# Patient Record
Sex: Female | Born: 1960 | Race: Black or African American | Hispanic: No | Marital: Single | Smoking: Former smoker
Health system: Southern US, Community
[De-identification: ages and names within clinical notes are randomized; demographics above are authoritative.]

## PROBLEM LIST (undated history)

## (undated) DIAGNOSIS — F250 Schizoaffective disorder, bipolar type: Secondary | ICD-10-CM

## (undated) DIAGNOSIS — J45909 Unspecified asthma, uncomplicated: Secondary | ICD-10-CM

## (undated) DIAGNOSIS — K219 Gastro-esophageal reflux disease without esophagitis: Secondary | ICD-10-CM

## (undated) DIAGNOSIS — F79 Unspecified intellectual disabilities: Secondary | ICD-10-CM

## (undated) DIAGNOSIS — G4730 Sleep apnea, unspecified: Secondary | ICD-10-CM

## (undated) HISTORY — PX: WRIST SURGERY: SHX841

---

## 2016-11-13 ENCOUNTER — Encounter (HOSPITAL_COMMUNITY): Payer: Self-pay | Admitting: *Deleted

## 2016-11-13 ENCOUNTER — Emergency Department (HOSPITAL_COMMUNITY)
Admission: EM | Admit: 2016-11-13 | Discharge: 2016-11-13 | Disposition: A | Discharge status: Skilled Nursing Facility | Payer: Medicaid Other | Attending: Emergency Medicine | Admitting: Emergency Medicine

## 2016-11-13 DIAGNOSIS — M25561 Pain in right knee: Secondary | ICD-10-CM | POA: Insufficient documentation

## 2016-11-13 DIAGNOSIS — J45909 Unspecified asthma, uncomplicated: Secondary | ICD-10-CM | POA: Insufficient documentation

## 2016-11-13 DIAGNOSIS — F489 Nonpsychotic mental disorder, unspecified: Secondary | ICD-10-CM | POA: Insufficient documentation

## 2016-11-13 DIAGNOSIS — G8929 Other chronic pain: Secondary | ICD-10-CM | POA: Insufficient documentation

## 2016-11-13 DIAGNOSIS — Z87891 Personal history of nicotine dependence: Secondary | ICD-10-CM | POA: Diagnosis not present

## 2016-11-13 DIAGNOSIS — M25562 Pain in left knee: Secondary | ICD-10-CM | POA: Diagnosis not present

## 2016-11-13 HISTORY — DX: Schizoaffective disorder, bipolar type: F25.0

## 2016-11-13 HISTORY — DX: Gastro-esophageal reflux disease without esophagitis: K21.9

## 2016-11-13 HISTORY — DX: Unspecified asthma, uncomplicated: J45.909

## 2016-11-13 HISTORY — DX: Unspecified intellectual disabilities: F79

## 2016-11-13 HISTORY — DX: Sleep apnea, unspecified: G47.30

## 2016-11-13 MED ORDER — NAPROXEN 500 MG PO TABS
500.0000 mg | ORAL_TABLET | Freq: Two times a day (BID) | ORAL | 0 refills | Status: DC
Start: 2016-11-13 — End: 2016-12-10

## 2016-11-13 MED ORDER — ACETAMINOPHEN 500 MG PO TABS
1000.0000 mg | ORAL_TABLET | Freq: Once | ORAL | Status: AC
  Administered 2016-11-13: 1000 mg via ORAL
  Filled 2016-11-13: qty 2

## 2016-11-13 NOTE — ED Triage Notes (Signed)
[facility]. Pt slipped on her comforter this morning. This was a witnessed fall, staff stated to ems she sat down in the floor. Denies hitting her head or any loc. EMS was called to help get the patient out of the floor. Upon arrival she told them she wanted to go to the hospital to have both of her knees checked. Pt isn't happy with facility she is staying at right now and would like more info about going somewhere else.

## 2016-11-13 NOTE — Discharge Instructions (Signed)
You have been prescribed naproxen for knee pain. Please take this as prescribed. Please read attached information about arthritis and knee pain.  Follow up with your primary care provider for worsening knee pain

## 2016-11-13 NOTE — ED Provider Notes (Signed)
[facility]-EMERGENCY DEPT Provider Note   CSN: [id number] Arrival date & time: 11/13/16  0841     History   Chief Complaint Chief Complaint  Patient presents with  . Knee Pain    HPI Sarah Hurst is a 56 y.o. female is brought to ED by EMS after witnessed fall PTA at assisted living facility.  Patient is alert and oriented to self, place and time. She was making her bed when she tripped over her comforter falling on her buttocks.  Fall was witnessed by facility staff.  Patient denies head trauma or LOC.  Denies back pain.  Patient is ambulatory with walker at baseline. After fall, she was able to get up with assist and ambulate to ambulance bed.  Today, patient is concerned about bilateral knee pain.  She has history of arthritis to bilateral knees. Pain in knees is aggravated by direct palpation, flexion and extension and walking. Pain is alleviated by prescribed medication for arthritis.  Patient did not fall on knees during fall today.  She has not taken her morning medications. Denies h/o gout or IVDU. No associated fevers, joint swelling, calf pain or asymmetric LE swelling. No h/o DVT.   HPI  Past Medical History:  Diagnosis Date  . Asthma   . GERD (gastroesophageal reflux disease)   . Mental disability   . Schizoaffective disorder, bipolar type (HCC)   . Sleep apnea     There are no active problems to display for this patient.   Past Surgical History:  Procedure Laterality Date  . WRIST SURGERY      OB History    No data available       Home Medications    Prior to Admission medications   Medication Sig Start Date End Date Taking? Authorizing Provider  naproxen (NAPROSYN) 500 MG tablet Take 1 tablet (500 mg total) by mouth 2 (two) times daily. 11/13/16   Liberty HandyGibbons, Ramin Zoll J, PA-C    Family History No family history on file.  Social History Social History  Substance Use Topics  . Smoking status: Former Games developermoker  . Smokeless tobacco: Never Used  . Alcohol use No      Allergies   Patient has no known allergies.   Review of Systems Review of Systems  Constitutional: Negative for fever.  Respiratory: Negative for shortness of breath.   Cardiovascular: Negative for chest pain, palpitations and leg swelling.  Gastrointestinal: Negative for nausea and vomiting.  Genitourinary: Negative for difficulty urinating.  Musculoskeletal: Positive for arthralgias. Negative for back pain and joint swelling.  Skin: Negative for wound.  Neurological: Negative for syncope, weakness and numbness.     Physical Exam Updated Vital Signs BP 134/81 (BP Location: Left Arm)   Pulse 81   Temp 97.5 F (36.4 C) (Oral)   Resp 16   Ht 5\' 7"  (1.702 m)   Wt (!) 181.4 kg (400 lb)   SpO2 100%   BMI 62.65 kg/m   Physical Exam  Constitutional: She is oriented to person, place, and time. She appears well-developed and well-nourished. No distress.  NAD.  HENT:  Head: Normocephalic and atraumatic.  Right Ear: External ear normal.  Left Ear: External ear normal.  Nose: Nose normal.  Mouth/Throat: Oropharynx is clear and moist.  Eyes: Conjunctivae and EOM are normal. Pupils are equal, round, and reactive to light. No scleral icterus.  Neck: Normal range of motion.  Cardiovascular: Normal rate, regular rhythm and normal heart sounds.   No murmur heard. DP, PT and popliteal  pulses 2+ bilaterally No LE edema No calf tenderness  Pulmonary/Chest: Effort normal and breath sounds normal. She has no wheezes.  Abdominal: Soft. Bowel sounds are normal. There is no tenderness.  Musculoskeletal: Normal range of motion. She exhibits tenderness. She exhibits no deformity.  +TTP to patellar tendon, bilaterally No obvious deformity of knees including edema, erythema or effusion.  Full passive ROM of knees bilaterally with normal patellar J tracking bilaterally.  No medial or lateral joint line tenderness.   No bony tenderness over patella, fibular head or tibial tuberosity.     No tenderness over MCL, LCL or quadriceps tendon.    Negative Lachman's. Negative posterior drawer test.   No varus or valgus laxity.  No crepitus with knee ROM.   Neurological: She is alert and oriented to person, place, and time.  5/5 strength with knee flexion and extension, bilaterally.  5/5 strength with ankle dorsiflexion and plantar flexion, bilaterally.  Feet: sensation to light touch intact in the distribution of the saphenous nerve, medial plantar nerve, lateral plantar nerve, bilaterally.   Skin: Skin is warm and dry. Capillary refill takes less than 2 seconds.  Psychiatric: She has a normal mood and affect. Her behavior is normal. Judgment and thought content normal.  Nursing note and vitals reviewed.    ED Treatments / Results  Labs (all labs ordered are listed, but only abnormal results are displayed) Labs Reviewed - No data to display  EKG  EKG Interpretation None       Radiology No results found.  Procedures Procedures (including critical care time)  Medications Ordered in ED Medications  acetaminophen (TYLENOL) tablet 1,000 mg (1,000 mg Oral Given 11/13/16 0929)     Initial Impression / Assessment and Plan / ED Course  I have reviewed the triage vital signs and the nursing notes.  Pertinent labs & imaging results that were available during my care of the patient were reviewed by me and considered in my medical decision making (see chart for details).    56 yo female brought to ED by EMS after witnessed mechanical fall PTA, patient landed on buttocks. Denies back pain. No head trauma or LOC. Patient was able to ambulate to ambulance bed after fall. I reviewed patient's medication list at bedside, she does not take any anticoagulants.  Patient's main concern today is her chronic bilateral knee pain. She notes its typical of her arthritis pain. On exam she has mild diffuse knee tenderness, bilaterally. No erythema, edema, fluctuance to suggest septic  arthritis or gout. No h/o IVDU or gout. No recent or preceding direct trauma to knees. No asymmetric LE edema, no calf or popliteal tenderness. Doubt DVT. Suspect MSK vs arthritis pain. No indication for imaging today. Will give tylenol.  Patient considered safe for discharge at this time with naproxen and f/u with PCP as needed for worsening or persistent symptoms. Patient verbalized understanding and is agreeable with plan.  Final Clinical Impressions(s) / ED Diagnoses   Final diagnoses:  Chronic pain of both knees    New Prescriptions Discharge Medication List as of 11/13/2016  9:51 AM    START taking these medications   Details  naproxen (NAPROSYN) 500 MG tablet Take 1 tablet (500 mg total) by mouth 2 (two) times daily., Starting Wed 11/13/2016, Print         Liberty Handy, PA-C 11/13/16 1122    Samuel Jester, DO 11/16/16 1919

## 2016-11-24 ENCOUNTER — Emergency Department (HOSPITAL_COMMUNITY)
Admission: EM | Admit: 2016-11-24 | Discharge: 2016-11-25 | Disposition: A | Discharge status: Home / Self Care | Payer: Medicaid Other | Attending: Emergency Medicine | Admitting: Emergency Medicine

## 2016-11-24 ENCOUNTER — Encounter (HOSPITAL_COMMUNITY): Payer: Self-pay | Admitting: Emergency Medicine

## 2016-11-24 DIAGNOSIS — R0602 Shortness of breath: Secondary | ICD-10-CM | POA: Diagnosis present

## 2016-11-24 DIAGNOSIS — R42 Dizziness and giddiness: Secondary | ICD-10-CM | POA: Diagnosis not present

## 2016-11-24 DIAGNOSIS — J45909 Unspecified asthma, uncomplicated: Secondary | ICD-10-CM | POA: Insufficient documentation

## 2016-11-24 DIAGNOSIS — Z87891 Personal history of nicotine dependence: Secondary | ICD-10-CM | POA: Diagnosis not present

## 2016-11-24 NOTE — ED Triage Notes (Signed)
Pt comes from [facility] by ems for not breathing right per pt and blood sugar of 67.

## 2016-11-25 ENCOUNTER — Emergency Department (HOSPITAL_COMMUNITY): Payer: Medicaid Other

## 2016-11-25 LAB — CBC WITH DIFFERENTIAL/PLATELET
BASOS ABS: 0 10*3/uL (ref 0.0–0.1)
Basophils Relative: 0 %
EOS PCT: 1 %
Eosinophils Absolute: 0.1 10*3/uL (ref 0.0–0.7)
HCT: 35.1 % — ABNORMAL LOW (ref 36.0–46.0)
Hemoglobin: 11.1 g/dL — ABNORMAL LOW (ref 12.0–15.0)
LYMPHS PCT: 34 %
Lymphs Abs: 2.9 10*3/uL (ref 0.7–4.0)
MCH: 28.6 pg (ref 26.0–34.0)
MCHC: 31.6 g/dL (ref 30.0–36.0)
MCV: 90.5 fL (ref 78.0–100.0)
Monocytes Absolute: 0.3 10*3/uL (ref 0.1–1.0)
Monocytes Relative: 4 %
NEUTROS ABS: 5.1 10*3/uL (ref 1.7–7.7)
Neutrophils Relative %: 61 %
PLATELETS: 146 10*3/uL — AB (ref 150–400)
RBC: 3.88 MIL/uL (ref 3.87–5.11)
RDW: 15 % (ref 11.5–15.5)
WBC: 8.5 10*3/uL (ref 4.0–10.5)

## 2016-11-25 LAB — URINALYSIS, ROUTINE W REFLEX MICROSCOPIC
BILIRUBIN URINE: NEGATIVE
GLUCOSE, UA: NEGATIVE mg/dL
Hgb urine dipstick: NEGATIVE
Ketones, ur: NEGATIVE mg/dL
Leukocytes, UA: NEGATIVE
Nitrite: NEGATIVE
PH: 5 (ref 5.0–8.0)
Protein, ur: NEGATIVE mg/dL
SPECIFIC GRAVITY, URINE: 1.026 (ref 1.005–1.030)

## 2016-11-25 LAB — COMPREHENSIVE METABOLIC PANEL
ALT: 10 U/L — AB (ref 14–54)
AST: 17 U/L (ref 15–41)
Albumin: 3.4 g/dL — ABNORMAL LOW (ref 3.5–5.0)
Alkaline Phosphatase: 48 U/L (ref 38–126)
Anion gap: 9 (ref 5–15)
BUN: 14 mg/dL (ref 6–20)
CHLORIDE: 110 mmol/L (ref 101–111)
CO2: 26 mmol/L (ref 22–32)
CREATININE: 0.66 mg/dL (ref 0.44–1.00)
Calcium: 9.2 mg/dL (ref 8.9–10.3)
GFR calc Af Amer: >60 mL/min (ref >60)
Glucose, Bld: 77 mg/dL (ref 65–99)
Potassium: 3.5 mmol/L (ref 3.5–5.1)
Sodium: 145 mmol/L (ref 135–145)
Total Bilirubin: 0.4 mg/dL (ref 0.3–1.2)
Total Protein: 6.6 g/dL (ref 6.5–8.1)

## 2016-11-25 LAB — TROPONIN I: Troponin I: <0.03 ng/mL (ref <0.03)

## 2016-11-25 LAB — BRAIN NATRIURETIC PEPTIDE: B Natriuretic Peptide: 91 pg/mL (ref 0.0–100.0)

## 2016-11-25 MED ORDER — MECLIZINE HCL 25 MG PO TABS: 25.0000 mg | ORAL_TABLET | Freq: Three times a day (TID) | ORAL | 0 refills | Status: DC | PRN

## 2016-11-25 NOTE — ED Notes (Signed)
[facility],

## 2016-11-25 NOTE — ED Notes (Signed)
Pt given crackers and diet ginger ale per request,

## 2016-11-25 NOTE — ED Notes (Signed)
[facility] here to transport to [facility],

## 2016-11-25 NOTE — ED Notes (Addendum)
Pt c/o sob and chest pain that started tonight, Dr Blinda LeatherwoodPollina in prior to RN, see edp assessment for further,

## 2016-11-25 NOTE — ED Provider Notes (Signed)
[facility]-EM[facility] DEPT Provider Note   CSN: [id number] Arrival date & time: 11/24/16  2352     History   Chief Complaint Chief Complaint  Patient presents with  . Hypoglycemia    HPI Sarah Hurst is a 56 y.o. female.  Patient presents to the emergency department for evaluation of shortness of breath. Patient is currently residing in a group home. She began to complain about having some difficulty breathing. She has not been recently ill, no cough, chest congestion. She has not experiencing any concomitant chest pain. She comes to the ER by ambulance. Vital signs have been normal. She did, however, have low blood sugar upon check. At arrival to the ER her main complaint is that she has been feeling like she is dizzy and spinning whenever she lies down. This began earlier today and has been present throughout the day. No associated mental status changes or headache. Has not had any numbness, tingling or weakness of the extremities, no unilateral symptoms. No speech difficulty.      Past Medical History:  Diagnosis Date  . Asthma   . GERD (gastroesophageal reflux disease)   . Mental disability   . Schizoaffective disorder, bipolar type (HCC)   . Sleep apnea     There are no active problems to display for this patient.   Past Surgical History:  Procedure Laterality Date  . WRIST SURGERY      OB History    No data available       Home Medications    Prior to Admission medications   Medication Sig Start Date End Date Taking? Authorizing Provider  meclizine (ANTIVERT) 25 MG tablet Take 1 tablet (25 mg total) by mouth 3 (three) times daily as needed for dizziness. 11/25/16   Gilda CreasePollina, Christopher J, MD  naproxen (NAPROSYN) 500 MG tablet Take 1 tablet (500 mg total) by mouth 2 (two) times daily. 11/13/16   Liberty HandyGibbons, Claudia J, PA-C    Family History No family history on file.  Social History Social History  Substance Use Topics  . Smoking status: Former Games developermoker  .  Smokeless tobacco: Never Used  . Alcohol use No     Allergies   Patient has no known allergies.   Review of Systems Review of Systems  Respiratory: Positive for shortness of breath.   Gastrointestinal: Negative for abdominal pain, nausea and vomiting.  Neurological: Positive for dizziness. Negative for headaches.  All other systems reviewed and are negative.    Physical Exam Updated Vital Signs BP (!) 121/104   Pulse 65   Temp 97.9 F (36.6 C)   Resp 16   Ht 5\' 7"  (1.702 m)   Wt (!) 181.4 kg (400 lb)   SpO2 100%   BMI 62.65 kg/m   Physical Exam  Constitutional: She is oriented to person, place, and time. She appears well-developed and well-nourished. No distress.  HENT:  Head: Normocephalic and atraumatic.  Right Ear: Hearing normal.  Left Ear: Hearing normal.  Nose: Nose normal.  Mouth/Throat: Oropharynx is clear and moist and mucous membranes are normal.  Eyes: Conjunctivae and EOM are normal. Pupils are equal, round, and reactive to light.  Neck: Normal range of motion. Neck supple.  Cardiovascular: Regular rhythm, S1 normal and S2 normal.  Exam reveals no gallop and no friction rub.   No murmur heard. Pulmonary/Chest: Effort normal and breath sounds normal. No respiratory distress. She exhibits no tenderness.  Abdominal: Soft. Normal appearance and bowel sounds are normal. There is no hepatosplenomegaly. There  is no tenderness. There is no rebound, no guarding, no tenderness at McBurney's point and negative Murphy's sign. No hernia.  Musculoskeletal: Normal range of motion.  Neurological: She is alert and oriented to person, place, and time. She has normal strength. No cranial nerve deficit or sensory deficit. Coordination normal. GCS eye subscore is 4. GCS verbal subscore is 5. GCS motor subscore is 6.  Extraocular muscle movement: normal No visual field cut Pupils: equal and reactive both direct and consensual response is normal No nystagmus present      Sensory function is intact to light touch, pinprick Proprioception intact  Grip strength 5/5 symmetric in upper extremities No pronator drift Normal finger to nose bilaterally  Lower extremity strength 5/5 against gravity Normal heel to shin bilaterally     Skin: Skin is warm, dry and intact. No rash noted. No cyanosis.  Psychiatric: She has a normal mood and affect. Her speech is normal and behavior is normal. Thought content normal.  Nursing note and vitals reviewed.    ED Treatments / Results  Labs (all labs ordered are listed, but only abnormal results are displayed) Labs Reviewed  CBC WITH DIFFERENTIAL/PLATELET - Abnormal; Notable for the following:       Result Value   Hemoglobin 11.1 (*)    HCT 35.1 (*)    Platelets 146 (*)    All other components within normal limits  COMPREHENSIVE METABOLIC PANEL - Abnormal; Notable for the following:    Albumin 3.4 (*)    ALT 10 (*)    All other components within normal limits  URINALYSIS, ROUTINE W REFLEX MICROSCOPIC - Abnormal; Notable for the following:    APPearance HAZY (*)    All other components within normal limits  TROPONIN I  BRAIN NATRIURETIC PEPTIDE    EKG  EKG Interpretation  Date/Time:  Monday November 25 2016 00:27:22 EDT Ventricular Rate:  54 PR Interval:    QRS Duration: 80 QT Interval:  400 QTC Calculation: 379 R Axis:   96 Text Interpretation:  Sinus rhythm Right ventricular hypertrophy Probable lateral infarct, age indeterminate No previous tracing Confirmed by Gilda Crease [phone]) on 11/25/2016 2:35:18 AM       Radiology Dg Chest 2 View  Result Date: 11/25/2016 CLINICAL DATA:  Dyspnea and chest pain, onset tonight. EXAM: CHEST  2 VIEW COMPARISON:  None. FINDINGS: The heart size and mediastinal contours are within normal limits. Both lungs are clear. The visualized skeletal structures are unremarkable. IMPRESSION: No active cardiopulmonary disease. Electronically Signed   By: Ellery Plunk M.D.   On: 11/25/2016 00:59   Ct Head Wo Contrast  Result Date: 11/25/2016 CLINICAL DATA:  Dyspnea and chest pain tonight.  Lightheadedness. EXAM: CT HEAD WITHOUT CONTRAST TECHNIQUE: Contiguous axial images were obtained from the base of the skull through the vertex without intravenous contrast. COMPARISON:  None. FINDINGS: Brain: There is no intracranial hemorrhage, mass or evidence of acute infarction. There is no extra-axial fluid collection. Gray matter and white matter appear normal. Cerebral volume is normal for age. Brainstem and posterior fossa are unremarkable. The CSF spaces appear normal. Vascular: No hyperdense vessel or unexpected calcification. Skull: Normal. Negative for fracture or focal lesion. Sinuses/Orbits: No acute finding. Other: None. IMPRESSION: Normal brain Electronically Signed   By: Ellery Plunk M.D.   On: 11/25/2016 00:51    Procedures Procedures (including critical care time)  Medications Ordered in ED Medications - No data to display   Initial Impression / Assessment and Plan /  ED Course  I have reviewed the triage vital signs and the nursing notes.  Pertinent labs & imaging results that were available during my care of the patient were reviewed by me and considered in my medical decision making (see chart for details).     Patient brought to the ER by EMS. She had originally complained of shortness of breath at the group home. At arrival to the ER her breathing has improved she has not had any chest pain. EKG does not show any obvious ischemia or infarct. Troponin is negative. No evidence of elevated BNP. Chest x-ray is clear, no pneumonia or congestive heart failure.  At arrival patient reports that she has been experiencing dizziness today. She reports a sensation of spinning when she lies down. She has no neurologic deficits noticed on examination. Her head CT was normal. Symptoms most likely peripheral vertigo. Will treat with  meclizine.  Final Clinical Impressions(s) / ED Diagnoses   Final diagnoses:  Vertigo    New Prescriptions New Prescriptions   MECLIZINE (ANTIVERT) 25 MG TABLET    Take 1 tablet (25 mg total) by mouth 3 (three) times daily as needed for dizziness.     Gilda Crease, MD 11/25/16 [phone]

## 2016-11-25 NOTE — ED Notes (Signed)
Lab at bedside for blood work.

## 2016-11-25 NOTE — ED Notes (Signed)
Patient transported to X-ray 

## 2016-11-25 NOTE — ED Notes (Signed)
RN [facility] with [facility] who advised that they would not have a ride for pt until am,

## 2016-12-10 ENCOUNTER — Encounter (HOSPITAL_COMMUNITY): Payer: Self-pay | Admitting: *Deleted

## 2016-12-10 ENCOUNTER — Emergency Department (HOSPITAL_COMMUNITY)
Admission: EM | Admit: 2016-12-10 | Discharge: 2016-12-10 | Disposition: A | Discharge status: Home / Self Care | Payer: Medicaid Other | Attending: Emergency Medicine | Admitting: Emergency Medicine

## 2016-12-10 DIAGNOSIS — R45851 Suicidal ideations: Secondary | ICD-10-CM | POA: Diagnosis present

## 2016-12-10 DIAGNOSIS — Z7984 Long term (current) use of oral hypoglycemic drugs: Secondary | ICD-10-CM | POA: Insufficient documentation

## 2016-12-10 DIAGNOSIS — F32A Depression, unspecified: Secondary | ICD-10-CM

## 2016-12-10 DIAGNOSIS — J45909 Unspecified asthma, uncomplicated: Secondary | ICD-10-CM | POA: Diagnosis not present

## 2016-12-10 DIAGNOSIS — Z79899 Other long term (current) drug therapy: Secondary | ICD-10-CM | POA: Insufficient documentation

## 2016-12-10 DIAGNOSIS — Z87891 Personal history of nicotine dependence: Secondary | ICD-10-CM | POA: Diagnosis not present

## 2016-12-10 DIAGNOSIS — F329 Major depressive disorder, single episode, unspecified: Secondary | ICD-10-CM | POA: Insufficient documentation

## 2016-12-10 NOTE — ED Notes (Signed)
Pt placed on to EMS stretcher. Pt verbalized understanding of discharge instructions.

## 2016-12-10 NOTE — Discharge Instructions (Signed)
Patient does not meet criteria for involuntary commitment. Follow-up with community mental health resources.

## 2016-12-10 NOTE — ED Triage Notes (Signed)
[facility], [phone]) under IVC paperwork. Per paperwork pt has hx of schizophrenia & prescribed medication. Pt told her health provider she wanted to leave group home & that she would kill herself.

## 2016-12-11 NOTE — ED Provider Notes (Signed)
[facility]-EMERG[facility]Y DEPT Provider Note   CSN: [id number] Arrival date & time: 12/10/16  2134     History   Chief Complaint Chief Complaint  Patient presents with  . V70.1    HPI Letha CapeDeborah Manolis is a 56 y.o. female.  Level V caveat for mental disability and schizoaffective disorder Patient presents under involuntary commitment from a group home after stating suicidal ideation. She no longer has suicidal ideation. No homicidal ideation or psychosis. She states she is unhappy at her group home.      Past Medical History:  Diagnosis Date  . Asthma   . GERD (gastroesophageal reflux disease)   . Mental disability   . Schizoaffective disorder, bipolar type (HCC)   . Sleep apnea     There are no active problems to display for this patient.   Past Surgical History:  Procedure Laterality Date  . WRIST SURGERY      OB History    No data available       Home Medications    Prior to Admission medications   Medication Sig Start Date End Date Taking? Authorizing Provider  calcium carbonate (CALCIUM 600) 1500 (600 Ca) MG TABS tablet Take 600 mg of elemental calcium by mouth daily with breakfast.   Yes [provider]  Cholecalciferol (VITAMIN D3) 1000 units CAPS Take 1,000 Units by mouth daily.   Yes [provider]  famotidine (PEPCID) 20 MG tablet Take 20 mg by mouth daily.   Yes [provider]  FLUoxetine (PROZAC) 20 MG capsule Take 20 mg by mouth daily.   Yes [provider]  metFORMIN (GLUCOPHAGE) 500 MG tablet Take 500 mg by mouth 2 (two) times daily with a meal.   Yes [provider]  Multiple Vitamins-Minerals (MULTIVITAMIN WITH MINERALS) tablet Take 1 tablet by mouth daily.   Yes [provider]  paliperidone (INVEGA SUSTENNA) 234 MG/1.5ML SUSP injection Inject 234 mg into the muscle every 30 (thirty) days.   Yes [provider]  risperiDONE (RISPERDAL) 1 MG tablet Take 1 mg by mouth 2 (two) times daily.    Yes [provider]    Family History No family history on file.  Social History Social History  Substance Use Topics  . Smoking status: Former Games developermoker  . Smokeless tobacco: Never Used  . Alcohol use No     Allergies   Patient has no known allergies.   Review of Systems Review of Systems  Reason unable to perform ROS: Mental disability.     Physical Exam Updated Vital Signs BP 130/80 (BP Location: Right Wrist)   Pulse 88   Temp 97.9 F (36.6 C) (Oral)   Resp 18   SpO2 97%   Physical Exam  Constitutional: She is oriented to person, place, and time.  Obese, no acute distress  HENT:  Head: Normocephalic and atraumatic.  Eyes: Conjunctivae are normal.  Neck: Neck supple.  Cardiovascular: Normal rate and regular rhythm.   Pulmonary/Chest: Effort normal and breath sounds normal.  Abdominal: Soft. Bowel sounds are normal.  Musculoskeletal: Normal range of motion.  Neurological: She is alert and oriented to person, place, and time.  Skin: Skin is warm and dry.  Psychiatric:  Flat affect  Nursing note and vitals reviewed.    ED Treatments / Results  Labs (all labs ordered are listed, but only abnormal results are displayed) Labs Reviewed - No data to display  EKG  EKG Interpretation None       Radiology No results found.  Procedures Procedures (including critical care time)  Medications Ordered in ED Medications - No data to display   Initial Impression / Assessment and Plan / ED Course  I have reviewed the triage vital signs and the nursing notes.  Pertinent labs & imaging results that were available during my care of the patient were reviewed by me and considered in my medical decision making (see chart for details).     Patient is not suicidal, homicidal, psychotic. Will resend IVC and return to group home  Final Clinical Impressions(s) / ED Diagnoses   Final diagnoses:  Depression, unspecified depression type    New  Prescriptions Discharge Medication List as of 12/10/2016 10:23 PM       Donnetta Hutching, MD 12/11/16 1747

## 2016-12-13 ENCOUNTER — Emergency Department (HOSPITAL_COMMUNITY)
Admission: EM | Admit: 2016-12-13 | Discharge: 2016-12-14 | Disposition: A | Discharge status: Other Acute Inpatient Hospital | Payer: Medicaid Other | Attending: Emergency Medicine | Admitting: Emergency Medicine

## 2016-12-13 ENCOUNTER — Encounter (HOSPITAL_COMMUNITY): Payer: Self-pay | Admitting: *Deleted

## 2016-12-13 DIAGNOSIS — Z87891 Personal history of nicotine dependence: Secondary | ICD-10-CM | POA: Insufficient documentation

## 2016-12-13 DIAGNOSIS — F250 Schizoaffective disorder, bipolar type: Secondary | ICD-10-CM | POA: Diagnosis not present

## 2016-12-13 DIAGNOSIS — R443 Hallucinations, unspecified: Secondary | ICD-10-CM | POA: Diagnosis present

## 2016-12-13 DIAGNOSIS — Z79899 Other long term (current) drug therapy: Secondary | ICD-10-CM | POA: Insufficient documentation

## 2016-12-13 DIAGNOSIS — J45909 Unspecified asthma, uncomplicated: Secondary | ICD-10-CM | POA: Diagnosis not present

## 2016-12-13 DIAGNOSIS — R45851 Suicidal ideations: Secondary | ICD-10-CM

## 2016-12-13 DIAGNOSIS — Z7984 Long term (current) use of oral hypoglycemic drugs: Secondary | ICD-10-CM | POA: Insufficient documentation

## 2016-12-13 LAB — CBC WITH DIFFERENTIAL/PLATELET
Basophils Absolute: 0 10*3/uL (ref 0.0–0.1)
Basophils Relative: 0 %
EOS PCT: 1 %
Eosinophils Absolute: 0.1 10*3/uL (ref 0.0–0.7)
HCT: 36.9 % (ref 36.0–46.0)
Hemoglobin: 11.9 g/dL — ABNORMAL LOW (ref 12.0–15.0)
LYMPHS ABS: 2.3 10*3/uL (ref 0.7–4.0)
LYMPHS PCT: 26 %
MCH: 29.1 pg (ref 26.0–34.0)
MCHC: 32.2 g/dL (ref 30.0–36.0)
MCV: 90.2 fL (ref 78.0–100.0)
MONO ABS: 0.3 10*3/uL (ref 0.1–1.0)
MONOS PCT: 3 %
Neutro Abs: 6.4 10*3/uL (ref 1.7–7.7)
Neutrophils Relative %: 70 %
PLATELETS: 200 10*3/uL (ref 150–400)
RBC: 4.09 MIL/uL (ref 3.87–5.11)
RDW: 14.9 % (ref 11.5–15.5)
WBC: 9.1 10*3/uL (ref 4.0–10.5)

## 2016-12-13 LAB — BASIC METABOLIC PANEL
Anion gap: 10 (ref 5–15)
BUN: 11 mg/dL (ref 6–20)
CALCIUM: 9.4 mg/dL (ref 8.9–10.3)
CO2: 27 mmol/L (ref 22–32)
Chloride: 105 mmol/L (ref 101–111)
Creatinine, Ser: 0.84 mg/dL (ref 0.44–1.00)
GFR calc Af Amer: >60 mL/min (ref >60)
GLUCOSE: 106 mg/dL — AB (ref 65–99)
POTASSIUM: 3.3 mmol/L — AB (ref 3.5–5.1)
Sodium: 142 mmol/L (ref 135–145)

## 2016-12-13 LAB — URINALYSIS, ROUTINE W REFLEX MICROSCOPIC
Glucose, UA: NEGATIVE mg/dL
Hgb urine dipstick: NEGATIVE
KETONES UR: NEGATIVE mg/dL
Nitrite: NEGATIVE
PH: 5 (ref 5.0–8.0)
Protein, ur: 30 mg/dL — AB
Specific Gravity, Urine: 1.034 — ABNORMAL HIGH (ref 1.005–1.030)

## 2016-12-13 LAB — RAPID URINE DRUG SCREEN, HOSP PERFORMED
Amphetamines: NOT DETECTED
BARBITURATES: NOT DETECTED
BENZODIAZEPINES: POSITIVE — AB
Cocaine: NOT DETECTED
Opiates: NOT DETECTED
Tetrahydrocannabinol: NOT DETECTED

## 2016-12-13 MED ORDER — FAMOTIDINE 20 MG PO TABS
20.0000 mg | ORAL_TABLET | Freq: Every day | ORAL | Status: DC
  Administered 2016-12-13 – 2016-12-14 (×2): 20 mg via ORAL
  Filled 2016-12-13 (×2): qty 1

## 2016-12-13 MED ORDER — PALIPERIDONE PALMITATE 234 MG/1.5ML IM SUSP: 234.0000 mg | INTRAMUSCULAR | Status: DC

## 2016-12-13 MED ORDER — ADULT MULTIVITAMIN W/MINERALS CH
ORAL_TABLET | ORAL | Status: AC
  Administered 2016-12-13: 1 via ORAL
  Filled 2016-12-13: qty 1

## 2016-12-13 MED ORDER — FLUOXETINE HCL 20 MG PO CAPS
20.0000 mg | ORAL_CAPSULE | Freq: Every day | ORAL | Status: DC
  Administered 2016-12-13 – 2016-12-14 (×2): 20 mg via ORAL
  Filled 2016-12-13 (×2): qty 1

## 2016-12-13 MED ORDER — RISPERIDONE 1 MG PO TABS
1.0000 mg | ORAL_TABLET | Freq: Two times a day (BID) | ORAL | Status: DC
Start: 2016-12-13 — End: 2016-12-14
  Administered 2016-12-13 – 2016-12-14 (×3): 1 mg via ORAL
  Filled 2016-12-13 (×7): qty 1

## 2016-12-13 MED ORDER — HALOPERIDOL DECANOATE 100 MG/ML IM SOLN
100.0000 mg | Freq: Once | INTRAMUSCULAR | Status: AC
  Administered 2016-12-13: 100 mg via INTRAMUSCULAR
  Filled 2016-12-13: qty 1

## 2016-12-13 MED ORDER — METFORMIN HCL 500 MG PO TABS
500.0000 mg | ORAL_TABLET | Freq: Two times a day (BID) | ORAL | Status: DC
  Administered 2016-12-13 – 2016-12-14 (×3): 500 mg via ORAL
  Filled 2016-12-13 (×3): qty 1

## 2016-12-13 MED ORDER — ADULT MULTIVITAMIN W/MINERALS CH
1.0000 | ORAL_TABLET | Freq: Every day | ORAL | Status: DC
  Administered 2016-12-13 – 2016-12-14 (×2): 1 via ORAL
  Filled 2016-12-13: qty 1

## 2016-12-13 MED ORDER — ACETAMINOPHEN 325 MG PO TABS
650.0000 mg | ORAL_TABLET | ORAL | Status: DC | PRN
  Administered 2016-12-14: 650 mg via ORAL
  Filled 2016-12-13: qty 2

## 2016-12-13 MED ORDER — ALUM & MAG HYDROXIDE-SIMETH 200-200-20 MG/5ML PO SUSP: 30.0000 mL | Freq: Four times a day (QID) | ORAL | Status: DC | PRN

## 2016-12-13 MED ORDER — ZOLPIDEM TARTRATE 5 MG PO TABS
5.0000 mg | ORAL_TABLET | Freq: Every evening | ORAL | Status: DC | PRN
  Administered 2016-12-13: 5 mg via ORAL
  Filled 2016-12-13: qty 1

## 2016-12-13 MED ORDER — ONDANSETRON HCL 4 MG PO TABS: 4.0000 mg | ORAL_TABLET | Freq: Three times a day (TID) | ORAL | Status: DC | PRN

## 2016-12-13 NOTE — ED Notes (Signed)
Pt has been wanded by security and placed in a gown. Pt unable to fit in purple scrubs.   Belongings have been placed in locker room.
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
## 2016-12-13 NOTE — ED Provider Notes (Signed)
[facility]-EMERGENCY DEPT Provider Note   CSN: [id number] Arrival date & time: 12/13/16  1231     History   Chief Complaint Chief Complaint  Patient presents with  . Suicidal    HPI Sarah Hurst is a 56 y.o. female.  HPI  The patient is a 57 year old female, she has a known history of schizophrenia, she was in the hospital 3 days ago when she was here under involuntary commitment after voicing suicidal ideation. By the time that she arrived she stated that she was no longer suicidal and stated that she was just unhappy at her group home. Today she states that she has been having voices telling her to kill her self, she states that the staff at her facility have told her that if she is not going to eat or take her medications than she should just kill her self. In response to this the patient got her walker and started walking down the road in the middle of the road. She is brought in for evaluation in the situation. The patient states that she is actively suicidal. She states that the majority of her unhappiness stems from her living situation and the fact that her son is not visited her in 2 years as well as the auditory hallucinations which are command hallucinations telling her to kill her self. She has not taken her medication in several days, she is not more specific than this. She did take her medications this morning. She has not been eating the food stating that "it's nasty"  Past Medical History:  Diagnosis Date  . Asthma   . GERD (gastroesophageal reflux disease)   . Mental disability   . Schizoaffective disorder, bipolar type (HCC)   . Sleep apnea     There are no active problems to display for this patient.   Past Surgical History:  Procedure Laterality Date  . WRIST SURGERY      OB History    No data available       Home Medications    Prior to Admission medications   Medication Sig Start Date End Date Taking? Authorizing Provider  calcium carbonate (CALCIUM  600) 1500 (600 Ca) MG TABS tablet Take 600 mg of elemental calcium by mouth daily with breakfast.    [provider]  Cholecalciferol (VITAMIN D3) 1000 units CAPS Take 1,000 Units by mouth daily.    [provider]  famotidine (PEPCID) 20 MG tablet Take 20 mg by mouth daily.    [provider]  FLUoxetine (PROZAC) 20 MG capsule Take 20 mg by mouth daily.    [provider]  metFORMIN (GLUCOPHAGE) 500 MG tablet Take 500 mg by mouth 2 (two) times daily with a meal.    [provider]  Multiple Vitamins-Minerals (MULTIVITAMIN WITH MINERALS) tablet Take 1 tablet by mouth daily.    [provider]  paliperidone (INVEGA SUSTENNA) 234 MG/1.5ML SUSP injection Inject 234 mg into the muscle every 30 (thirty) days.    [provider]  risperiDONE (RISPERDAL) 1 MG tablet Take 1 mg by mouth 2 (two) times daily.    [provider]    Family History No family history on file.  Social History Social History  Substance Use Topics  . Smoking status: Former Games developer  . Smokeless tobacco: Never Used  . Alcohol use No     Allergies   Patient has no known allergies.   Review of Systems Review of Systems  All other systems reviewed and are negative.  Physical Exam Updated Vital Signs Ht 5\' 7"  (1.702 m)   Wt (!) 181.4 kg (400 lb)   BMI 62.65 kg/m   Physical Exam  Constitutional: She appears well-developed and well-nourished. No distress.  HENT:  Head: Normocephalic and atraumatic.  Mouth/Throat: Oropharynx is clear and moist. No oropharyngeal exudate.  Eyes: Pupils are equal, round, and reactive to light. Conjunctivae and EOM are normal. Right eye exhibits no discharge. Left eye exhibits no discharge. No scleral icterus.  Neck: Normal range of motion. Neck supple. No JVD present. No thyromegaly present.  Cardiovascular: Normal rate, regular rhythm, normal heart sounds and intact distal pulses.  Exam reveals no gallop and no  friction rub.   No murmur heard. Pulmonary/Chest: Effort normal and breath sounds normal. No respiratory distress. She has no wheezes. She has no rales.  Abdominal: Soft. Bowel sounds are normal. She exhibits no distension and no mass. There is no tenderness.  Musculoskeletal: Normal range of motion. She exhibits no edema or tenderness.  Lymphadenopathy:    She has no cervical adenopathy.  Neurological: She is alert. Coordination normal.  Skin: Skin is warm and dry. No rash noted. No erythema.  Psychiatric: She has a normal mood and affect. Her behavior is normal.  Flat affect Voices hallucinations Actively suicidal  Nursing note and vitals reviewed.    ED Treatments / Results  Labs (all labs ordered are listed, but only abnormal results are displayed) Labs Reviewed - No data to display   Radiology No results found.  Procedures Procedures (including critical care time)  Medications Ordered in ED Medications - No data to display   Initial Impression / Assessment and Plan / ED Course  I have reviewed the triage vital signs and the nursing notes.  Pertinent labs & imaging results that were available during my care of the patient were reviewed by me and considered in my medical decision making (see chart for details).     We'll pursue workup for her progressive suicidal thoughts and ideations, the patient does not appear to be in medical distress  SW states needs TTS consult Consult ordered Home meds reconciled Stable appearing  Change of shift - care signed out to oncoming EDP to f/u results   Final Clinical Impressions(s) / ED Diagnoses   Final diagnoses:  None    New Prescriptions New Prescriptions   No medications on file     Eber HongMiller, Andren Bethea, MD 12/13/16 1519

## 2016-12-13 NOTE — ED Notes (Signed)
Pts walker placed in locker room.

## 2016-12-13 NOTE — Clinical Social Work Note (Signed)
LCSW spoke with Waldron SessionVonda  [phone] with [employer] who advised that patient has been threatening to kills herself all week and that patient has been refusing her medication all week.  She has also been refusing to eat. Patient took off her adult brief at the facility and urinated and defecated in the facility floor multiple times per day this week. She refused ACTT services (her IM) and left the facility with an adult brief walking down [address] with her walker [facility]  Patient also has a history of threatening to cut herself. [facility] for [facility].  [facility]   She can return to the facility. Needs TTS consult.   ACTT services with [employer].    Anaiyah Anglemyer, Juleen ChinaHeather D, LCSW

## 2016-12-13 NOTE — ED Notes (Signed)
Pharmacy states no invega. edp aware.

## 2016-12-13 NOTE — BHH Counselor (Signed)
TTS assessment complete- pt is recommended for inpatient treatment per Lamona Curlina O.   Zahir Eisenhour LPC, LCAS

## 2016-12-13 NOTE — ED Triage Notes (Signed)
Pt comes in by EMS for suicidal thoughts. She lives at a facility and expressed thoughts of wanting to harm herself and was told by staff to walk into the road with her walker if she wanted to kill herself. So, pt did this, EMS was called by police because reports were coming in of a woman standing in the road. Pt is voluntary. She told EMS to give her a razor blade so that she could harm herself in route.

## 2016-12-14 LAB — CBG MONITORING, ED
GLUCOSE-CAPILLARY: 75 mg/dL (ref 65–99)
Glucose-Capillary: 76 mg/dL (ref 65–99)
Glucose-Capillary: 84 mg/dL (ref 65–99)

## 2016-12-14 NOTE — Progress Notes (Signed)
Tinnie GensJeffrey with [facility] requested 1st page of IVC papers to be refaxed. [facility]-ED RN Zella Ballobin to be informed.  Melbourne Abtsatia Rakwon Letourneau, LCSWA Disposition staff 12/14/2016 11:02 AM

## 2016-12-14 NOTE — ED Notes (Signed)
TTS in progress 

## 2016-12-14 NOTE — ED Notes (Signed)
Pt notified of pending transfer

## 2016-12-14 NOTE — ED Notes (Signed)
Sitter assisted pt with bed bath. Magenta paper scrubs too small. Pt allowed to put on house dress.

## 2016-12-14 NOTE — Progress Notes (Addendum)
[facility] was [facility]. To Dr. Guss Bundehalla, to [facility]. Please call report at [phone]. Bed is ready but not enough staffing, per Tinnie GensJeffrey in admissions, ETA after 19:00.  [facility]-ED RN Zella BallRobin was notified.  Mel[place] Abtsatia Lisbet Busker, LCSWA Disposition staff 12/14/2016 1:21 PM

## 2016-12-14 NOTE — Progress Notes (Addendum)
[facility] requested patient's guardianship papers. This Education officer, environmentalwriter left voicemail for Waldron SessionVonda  [phone] with [employer] and requested call back. [facility]-ED RN Zella Ballobin notified.  Mel[place] Abtsatia Cherika Jessie, LCSWA Disposition staff 12/14/2016 11:12 AM

## 2016-12-14 NOTE — ED Notes (Signed)
[facility]

## 2016-12-14 NOTE — ED Notes (Signed)
Pt has been accepted to [facility] in the transition unit . Accepting is Dr Retia Passeharlla

## 2016-12-14 NOTE — Progress Notes (Signed)
Tinnie GensJeffrey with [facility] inquired if patient had any medical problems, if pt's IVC can be faxed, and if pt can return to her group home.  Per [facility]-ED RN Zella Ballobin, patient is medically cleared, [facility] papers will be [facility] at [phone], and pt is using walker to ambulate doing her ADLs. Tinnie GensJeffrey has been notified.  [facility], [facility] [phone], advised that patient can come back if patient went to treatment.   Writer awaiting call back from [facility] regarding [facility] efforts.  Mel[place] Abtsatia Saragrace Selke, LCSWA Disposition staff 12/14/2016 11:01 AM
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
## 2016-12-26 ENCOUNTER — Encounter (HOSPITAL_COMMUNITY): Payer: Self-pay | Admitting: *Deleted

## 2016-12-26 ENCOUNTER — Emergency Department (HOSPITAL_COMMUNITY)
Admission: EM | Admit: 2016-12-26 | Discharge: 2016-12-26 | Disposition: A | Discharge status: Home / Self Care | Payer: Medicaid Other | Attending: Emergency Medicine | Admitting: Emergency Medicine

## 2016-12-26 DIAGNOSIS — F250 Schizoaffective disorder, bipolar type: Secondary | ICD-10-CM | POA: Diagnosis not present

## 2016-12-26 DIAGNOSIS — F329 Major depressive disorder, single episode, unspecified: Secondary | ICD-10-CM | POA: Diagnosis present

## 2016-12-26 DIAGNOSIS — R45851 Suicidal ideations: Secondary | ICD-10-CM | POA: Diagnosis not present

## 2016-12-26 DIAGNOSIS — J45909 Unspecified asthma, uncomplicated: Secondary | ICD-10-CM | POA: Diagnosis not present

## 2016-12-26 DIAGNOSIS — Z87891 Personal history of nicotine dependence: Secondary | ICD-10-CM | POA: Insufficient documentation

## 2016-12-26 LAB — CBC WITH DIFFERENTIAL/PLATELET
BASOS PCT: 0 %
Basophils Absolute: 0 10*3/uL (ref 0.0–0.1)
EOS ABS: 0 10*3/uL (ref 0.0–0.7)
Eosinophils Relative: 0 %
HCT: 34.2 % — ABNORMAL LOW (ref 36.0–46.0)
Hemoglobin: 10.9 g/dL — ABNORMAL LOW (ref 12.0–15.0)
Lymphocytes Relative: 29 %
Lymphs Abs: 2.2 10*3/uL (ref 0.7–4.0)
MCH: 28.8 pg (ref 26.0–34.0)
MCHC: 31.9 g/dL (ref 30.0–36.0)
MCV: 90.5 fL (ref 78.0–100.0)
MONO ABS: 0.3 10*3/uL (ref 0.1–1.0)
Monocytes Relative: 4 %
NEUTROS ABS: 5.1 10*3/uL (ref 1.7–7.7)
NEUTROS PCT: 67 %
PLATELETS: 152 10*3/uL (ref 150–400)
RBC: 3.78 MIL/uL — ABNORMAL LOW (ref 3.87–5.11)
RDW: 15.3 % (ref 11.5–15.5)
WBC: 7.6 10*3/uL (ref 4.0–10.5)

## 2016-12-26 LAB — URINALYSIS, ROUTINE W REFLEX MICROSCOPIC
Glucose, UA: NEGATIVE mg/dL
Hgb urine dipstick: NEGATIVE
KETONES UR: NEGATIVE mg/dL
Nitrite: NEGATIVE
PH: 5 (ref 5.0–8.0)
PROTEIN: 30 mg/dL — AB
Specific Gravity, Urine: 1.029 (ref 1.005–1.030)

## 2016-12-26 LAB — COMPREHENSIVE METABOLIC PANEL
ALT: 11 U/L — ABNORMAL LOW (ref 14–54)
ANION GAP: 7 (ref 5–15)
AST: 24 U/L (ref 15–41)
Albumin: 3.3 g/dL — ABNORMAL LOW (ref 3.5–5.0)
Alkaline Phosphatase: 42 U/L (ref 38–126)
BUN: 13 mg/dL (ref 6–20)
CALCIUM: 9.2 mg/dL (ref 8.9–10.3)
CHLORIDE: 111 mmol/L (ref 101–111)
CO2: 27 mmol/L (ref 22–32)
Creatinine, Ser: 0.76 mg/dL (ref 0.44–1.00)
GFR calc non Af Amer: >60 mL/min (ref >60)
Glucose, Bld: 95 mg/dL (ref 65–99)
Potassium: 4.2 mmol/L (ref 3.5–5.1)
SODIUM: 145 mmol/L (ref 135–145)
Total Bilirubin: 0.4 mg/dL (ref 0.3–1.2)
Total Protein: 6.7 g/dL (ref 6.5–8.1)

## 2016-12-26 LAB — RAPID URINE DRUG SCREEN, HOSP PERFORMED
AMPHETAMINES: NOT DETECTED
BARBITURATES: NOT DETECTED
BARBITURATES: NOT DETECTED
BENZODIAZEPINES: NOT DETECTED
Cocaine: NOT DETECTED
Opiates: NOT DETECTED
TETRAHYDROCANNABINOL: NOT DETECTED

## 2016-12-26 LAB — ETHANOL: Alcohol, Ethyl (B): <5 mg/dL (ref <5)

## 2016-12-26 MED ORDER — INSULIN ASPART 100 UNIT/ML ~~LOC~~ SOLN: 1.0000 [IU] | Freq: Three times a day (TID) | SUBCUTANEOUS | Status: DC

## 2016-12-26 MED ORDER — METRONIDAZOLE 500 MG PO TABS: 500.0000 mg | ORAL_TABLET | Freq: Three times a day (TID) | ORAL | Status: DC

## 2016-12-26 MED ORDER — ZOLPIDEM TARTRATE 5 MG PO TABS: 5.0000 mg | ORAL_TABLET | Freq: Every evening | ORAL | Status: DC | PRN

## 2016-12-26 MED ORDER — RISPERIDONE 1 MG PO TABS
1.0000 mg | ORAL_TABLET | Freq: Two times a day (BID) | ORAL | Status: DC
  Filled 2016-12-26 (×2): qty 1

## 2016-12-26 MED ORDER — RISAQUAD PO CAPS
1.0000 | ORAL_CAPSULE | Freq: Three times a day (TID) | ORAL | Status: DC
  Filled 2016-12-26 (×2): qty 1

## 2016-12-26 MED ORDER — HALOPERIDOL 2 MG PO TABS
2.0000 mg | ORAL_TABLET | Freq: Two times a day (BID) | ORAL | Status: DC
  Filled 2016-12-26 (×2): qty 1

## 2016-12-26 MED ORDER — FAMOTIDINE 20 MG PO TABS: 20.0000 mg | ORAL_TABLET | Freq: Every day | ORAL | Status: DC

## 2016-12-26 NOTE — Care Management (Signed)
Pt's gaurdian called CM dept to notify us that pt is returning to the hospital. She is aware and may be contacted if needed. She can bring documentation of guardianship again if necessary also. CM see's guardianship papers on the document list.

## 2016-12-26 NOTE — Consult Note (Signed)
Pt was seen over tele psych machine by this Clinical research associatewriter and Kateri PlummerKristin Cheshire, [facility] Counsel[facility]. Pt resides in a group home. Pt stated she is mad at the group home lady because she is "mean to me and makes me get up at 8 and I can't lay back down until 12." Pt is well known to this hospital and is at her baseline with chronic suicidal ideation. Pt is somewhat cognitively limited. Pt's guardian and group home were contacted in reference to Pt being at the emergency room and all parties are on board for Pt to return back to her group home. This writer attempted to reach the EDP for disposition but he was unavailable at this time. Pt's nurse and EDP were made aware of discharge plans by TTS.  Pt is psychiatrically cleared for discharge back to her group home.    Sarah AbbeLaurie Britton Parks,  NP-C 12/26/2016     1845

## 2016-12-26 NOTE — ED Notes (Signed)
Pt assisted to bedside toilet prior to leaving and taken to sink to wash hands via WC.

## 2016-12-26 NOTE — ED Triage Notes (Signed)
Pt comes in by EMS for suicidal actions. [facility] and states she isn't happy there. She wants to harm herself. She was found walking into traffic. Pt denies doing this to act out to go to another group home. Pt is alert and oriented.

## 2016-12-26 NOTE — Progress Notes (Signed)
CSW called [facility] ED Nurse, Sarah Hurst, to make her aware of existing discharge plan.  [facility] ED Nurse asked if anyone had spoken to EDP, Dr. Ranae Hurst, and stated "he was concerned about the pt going back to the group home.".   CSW attempted to contact the TTS Specialist that conducted the assessment to verify that the pt's status had been discussed with the EDP as is the typical protocol.  CSW offered to have the TTS NP contact Dr. Ranae Hurst and got number.  CSW was then able to talk to TTS Specialist, who confirmed that a discussion had already taken place with Dr. Ranae Hurst and he was in agreement with d/c.  CSW called APED Nurse back to relay this information.  [facility]ED Nurse, stated that [facility]  [facility] is to pick pt up at 7:30 PM.  Sarah Hurst, MSW, LCSWA Disposition Clinical Social Work [phone] (cell) [phone] (office)

## 2016-12-26 NOTE — Progress Notes (Addendum)
CSW called [facility]  [phone]) and spoke to house supervisor, [facility].  Ms. Bridgette HabermannHope advised that pt's guardian may not want her to [facility] the [facility] and asked that I call to confirm whether this was accurate.  CSW called [employer] [employer] [facility] [place] [phone] and spoke to Jaymes GraffAshley Brown, after hours staff on call, who agreed to contact guardian to verify decision about pt [facility].    Ms. Manson PasseyBrown called CSW back and indicated that [facility] per guardian's instructions. Per Ms. Manson PasseyBrown, Guardian had attempted to reach [facility] via text earlier today to let him know that pt could [facility] if [facility]/c'd.  CSW called [facility] [phone]) back and spoke to Ms. Hope who requested that the pt be returned by ambulance.  CSW explained that pt would need to be picked up.  Ms. Bridgette HabermannHope stated that she would "see if someone can come pick her up." and would call this writer back.  CSW will continue to follow to discharge.  Timmothy EulerJean T. Kaylyn LimSutter, MSW, LCSWA Disposition Clinical Social Work [phone] (cell) [phone] (office)

## 2016-12-26 NOTE — ED Notes (Signed)
Pt given her wallet with 4$ and some change, earphone-radio, and cell phone prior to leaving ED

## 2016-12-26 NOTE — ED Provider Notes (Signed)
[facility]-EM[facility]Y DEPT Provider Note   CSN: [id number] Arrival date & time: 12/26/16  1436     History   Chief Complaint Chief Complaint  Patient presents with  . Suicidal    HPI Sarah Hurst is a [age] y.o. female.  HPI The patient comes by EMS for suicidality. She was found walking into traffic. States she was trying to harm herself. Says she's been increasingly depressed because she misses her son that she is unhappy staying at the group home where she currently resides. States she has auditory hallucinations telling her to harm herself. Denies any visual hallucinations. No drugs or alcohol. States she's been compliant with her medication. Denies any other complaints other than bilateral knee arthralgias which she states is chronic. No new trauma. Past Medical History:  Diagnosis Date  . Asthma   . GERD (gastroesophageal reflux disease)   . Mental disability   . Schizoaffective disorder, bipolar type (HCC)   . Sleep apnea     There are no active problems to display for this patient.   Past Surgical History:  Procedure Laterality Date  . WRIST SURGERY      OB History    No data available       Home Medications    Prior to Admission medications   Medication Sig Start Date End Date Taking? Authorizing Provider  famotidine (PEPCID) 20 MG tablet Take 20 mg by mouth daily.   Yes [provider]  haloperidol (HALDOL) 1 MG tablet Take 2 mg by mouth 2 (two) times daily.   Yes [provider]  insulin lispro (HUMALOG) 100 UNIT/ML injection Inject 1-12 Units into the skin 4 (four) times daily -  before meals and at bedtime. Patient takes per sliding scale; bg 70-149: 0 units, bg 150-200: 2 units, bg 201-250: 4 units, bg 251-300: 6 units, bg 301-350: 8 units, bg 351-400: 10 units, bg 401+: 12 units and contact medical doctor   Yes [provider]  lactobacillus acidophilus (BACID) TABS tablet Take 1 tablet by mouth 3 (three) times daily.   Yes [provider]  metroNIDAZOLE (FLAGYL) 500 MG tablet Take 500 mg by mouth 3 (three) times daily.   Yes [provider]  Multiple Vitamins-Minerals (MULTIVITAMIN WITH MINERALS) tablet Take 1 tablet by mouth daily.   Yes [provider]  ondansetron (ZOFRAN) 4 MG tablet Take 4 mg by mouth every 6 (six) hours as needed for nausea or vomiting.   Yes [provider]  risperiDONE (RISPERDAL) 1 MG tablet Take 1 mg by mouth 2 (two) times daily.   Yes [provider]  zolpidem (AMBIEN) 5 MG tablet Take 5 mg by mouth at bedtime as needed for sleep.   Yes [provider]    Family History No family history on file.  Social History Social History  Substance Use Topics  . Smoking status: Former Games developermoker  . Smokeless tobacco: Never Used  . Alcohol use No     Allergies   Patient has no known allergies.   Review of Systems Review of Systems  Constitutional: Negative for chills and fever.  Respiratory: Negative for shortness of breath.   Cardiovascular: Negative for chest pain.  Gastrointestinal: Negative for abdominal pain, constipation, diarrhea, nausea and vomiting.  Musculoskeletal: Positive for arthralgias. Negative for back pain, joint swelling, myalgias, neck pain and neck stiffness.  Skin: Negative for rash.  Neurological: Negative for dizziness, weakness, light-headedness, numbness and headaches.  Psychiatric/Behavioral: Positive for dysphoric mood, hallucinations and suicidal ideas.  All other systems reviewed and are negative.    Physical Exam Updated Vital Signs BP (!) 111/57   Pulse 71   Temp 98.1 F (36.7 C) (Oral)   Resp 16   Ht 5\' 7"  (1.702 m)   Wt (!) 181.4 kg (400 lb)   SpO2 100%   BMI 62.65 kg/m   Physical Exam  Constitutional: She is oriented to person, place, and time. She appears well-developed and well-nourished. No distress.  HENT:  Head: Normocephalic and atraumatic.  Mouth/Throat: Oropharynx is clear and moist.  No oropharyngeal exudate.  Eyes: Pupils are equal, round, and reactive to light. EOM are normal.  Neck: Normal range of motion. Neck supple.  Cardiovascular: Normal rate and regular rhythm.  Exam reveals no gallop and no friction rub.   No murmur heard. Pulmonary/Chest: Effort normal and breath sounds normal. No respiratory distress. She has no wheezes. She has no rales. She exhibits no tenderness.  Abdominal: Soft. Bowel sounds are normal. There is no tenderness. There is no rebound and no guarding.  Musculoskeletal: Normal range of motion. She exhibits no edema or tenderness.  Full range of motion of bilateral knees. No deformity, erythema, warmth or effusion.  Neurological: She is alert and oriented to person, place, and time.  5/5 motor in all extremities. Sensation fully intact.  Skin: Skin is warm and dry. No rash noted. No erythema.  Psychiatric: Her behavior is normal.  Blunt affect. Admits to suicidal ideation. Admits to auditory hallucinations. Does not appear to be responding to internal stimuli currently.  Nursing note and vitals reviewed.    ED Treatments / Results  Labs (all labs ordered are listed, but only abnormal results are displayed) Labs Reviewed  CBC WITH DIFFERENTIAL/PLATELET - Abnormal; Notable for the following:       Result Value   RBC 3.78 (*)    Hemoglobin 10.9 (*)    HCT 34.2 (*)    All other components within normal limits  COMPREHENSIVE METABOLIC PANEL - Abnormal; Notable for the following:    Albumin 3.3 (*)    ALT 11 (*)    All other components within normal limits  RAPID URINE DRUG SCREEN, HOSP PERFORMED - Abnormal; Notable for the following:    Opiates RESULTS UNAVAILABLE DUE TO INTERFERING SUBSTANCE (*)    Cocaine RESULTS UNAVAILABLE DUE TO INTERFERING SUBSTANCE (*)    Benzodiazepines RESULTS UNAVAILABLE DUE TO INTERFERING SUBSTANCE (*)    Amphetamines RESULTS UNAVAILABLE DUE TO INTERFERING SUBSTANCE (*)    Tetrahydrocannabinol RESULTS  UNAVAILABLE DUE TO INTERFERING SUBSTANCE (*)    All other components within normal limits  URINALYSIS, ROUTINE W REFLEX MICROSCOPIC - Abnormal; Notable for the following:    Color, Urine AMBER (*)    APPearance CLOUDY (*)    Bilirubin Urine SMALL (*)    Protein, ur 30 (*)    Leukocytes, UA SMALL (*)    Bacteria, UA MANY (*)    Squamous Epithelial / LPF TOO NUMEROUS TO COUNT (*)    All other components within normal limits  URINE CULTURE  ETHANOL  RAPID URINE DRUG SCREEN, HOSP PERFORMED    EKG  EKG Interpretation None       Radiology No results found.  Procedures Procedures (including critical care time)  Medications Ordered in ED Medications  famotidine (PEPCID) tablet 20 mg (not administered)  haloperidol (HALDOL) tablet 2 mg (not administered)  insulin aspart (novoLOG) injection 1-12 Units (not administered)  acidophilus (RISAQUAD) capsule 1 capsule (not administered)  metroNIDAZOLE (FLAGYL) tablet  500 mg (not administered)  risperiDONE (RISPERDAL) tablet 1 mg (not administered)  zolpidem (AMBIEN) tablet 5 mg (not administered)     Initial Impression / Assessment and Plan / ED Course  I have reviewed the triage vital signs and the nursing notes.  Pertinent labs & imaging results that were available during my care of the patient were reviewed by me and considered in my medical decision making (see chart for details).     Patient is medically cleared for psychiatric evaluation.   Evaluated by TTS. They're familiar with the patient. She's presented with similar symptoms in the past. Staying in a group home and in the thinks that she is feigning suicidal ideation for secondary gain. Recommending discharge back to group home and outpatient follow-up. Family at bedside and in agreement with plan. Final Clinical Impressions(s) / ED Diagnoses   Final diagnoses:  Suicidal thoughts    New Prescriptions New Prescriptions   No medications on file     Loren RacerYelverton,  Adanya Sosinski, MD 12/26/16 2013
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
## 2016-12-28 LAB — URINE CULTURE

## 2017-01-10 ENCOUNTER — Encounter (HOSPITAL_COMMUNITY): Payer: Self-pay | Admitting: Emergency Medicine

## 2017-01-10 ENCOUNTER — Emergency Department (HOSPITAL_COMMUNITY)
Admission: EM | Admit: 2017-01-10 | Discharge: 2017-01-11 | Disposition: A | Discharge status: Home / Self Care | Payer: Medicaid Other | Attending: Emergency Medicine | Admitting: Emergency Medicine

## 2017-01-10 DIAGNOSIS — J45909 Unspecified asthma, uncomplicated: Secondary | ICD-10-CM | POA: Diagnosis not present

## 2017-01-10 DIAGNOSIS — R440 Auditory hallucinations: Secondary | ICD-10-CM | POA: Diagnosis not present

## 2017-01-10 DIAGNOSIS — F259 Schizoaffective disorder, unspecified: Secondary | ICD-10-CM | POA: Diagnosis not present

## 2017-01-10 DIAGNOSIS — R45850 Homicidal ideations: Secondary | ICD-10-CM | POA: Diagnosis not present

## 2017-01-10 DIAGNOSIS — F250 Schizoaffective disorder, bipolar type: Secondary | ICD-10-CM | POA: Diagnosis not present

## 2017-01-10 DIAGNOSIS — R45851 Suicidal ideations: Secondary | ICD-10-CM | POA: Diagnosis not present

## 2017-01-10 DIAGNOSIS — Z79899 Other long term (current) drug therapy: Secondary | ICD-10-CM | POA: Diagnosis not present

## 2017-01-10 DIAGNOSIS — Z87891 Personal history of nicotine dependence: Secondary | ICD-10-CM | POA: Insufficient documentation

## 2017-01-10 LAB — SALICYLATE LEVEL: Salicylate Lvl: <7 mg/dL (ref 2.8–30.0)

## 2017-01-10 LAB — ETHANOL: Alcohol, Ethyl (B): <5 mg/dL (ref <5)

## 2017-01-10 LAB — COMPREHENSIVE METABOLIC PANEL
ALBUMIN: 3.6 g/dL (ref 3.5–5.0)
ALT: 12 U/L — AB (ref 14–54)
AST: 23 U/L (ref 15–41)
Alkaline Phosphatase: 49 U/L (ref 38–126)
Anion gap: 8 (ref 5–15)
BUN: 8 mg/dL (ref 6–20)
CALCIUM: 9.6 mg/dL (ref 8.9–10.3)
CO2: 27 mmol/L (ref 22–32)
CREATININE: 0.77 mg/dL (ref 0.44–1.00)
Chloride: 108 mmol/L (ref 101–111)
GFR calc Af Amer: >60 mL/min (ref >60)
GFR calc non Af Amer: >60 mL/min (ref >60)
GLUCOSE: 92 mg/dL (ref 65–99)
Potassium: 3.7 mmol/L (ref 3.5–5.1)
SODIUM: 143 mmol/L (ref 135–145)
TOTAL PROTEIN: 7 g/dL (ref 6.5–8.1)
Total Bilirubin: 0.6 mg/dL (ref 0.3–1.2)

## 2017-01-10 LAB — RAPID URINE DRUG SCREEN, HOSP PERFORMED
Amphetamines: NOT DETECTED
Barbiturates: NOT DETECTED
Benzodiazepines: NOT DETECTED
COCAINE: NOT DETECTED
Opiates: NOT DETECTED
TETRAHYDROCANNABINOL: NOT DETECTED

## 2017-01-10 LAB — CBC
HEMATOCRIT: 35.1 % — AB (ref 36.0–46.0)
HEMOGLOBIN: 11.4 g/dL — AB (ref 12.0–15.0)
MCH: 29.5 pg (ref 26.0–34.0)
MCHC: 32.5 g/dL (ref 30.0–36.0)
MCV: 90.7 fL (ref 78.0–100.0)
Platelets: 200 10*3/uL (ref 150–400)
RBC: 3.87 MIL/uL (ref 3.87–5.11)
RDW: 15.3 % (ref 11.5–15.5)
WBC: 7.4 10*3/uL (ref 4.0–10.5)

## 2017-01-10 LAB — ACETAMINOPHEN LEVEL

## 2017-01-10 MED ORDER — RISPERIDONE 1 MG PO TABS
1.0000 mg | ORAL_TABLET | Freq: Two times a day (BID) | ORAL | Status: DC
  Administered 2017-01-10 – 2017-01-11 (×2): 1 mg via ORAL
  Filled 2017-01-10 (×7): qty 1

## 2017-01-10 MED ORDER — ZOLPIDEM TARTRATE 5 MG PO TABS
5.0000 mg | ORAL_TABLET | Freq: Every evening | ORAL | Status: DC | PRN
  Administered 2017-01-10: 5 mg via ORAL
  Filled 2017-01-10: qty 1

## 2017-01-10 MED ORDER — HALOPERIDOL 2 MG PO TABS
2.0000 mg | ORAL_TABLET | Freq: Two times a day (BID) | ORAL | Status: DC
  Administered 2017-01-10 – 2017-01-11 (×2): 2 mg via ORAL
  Filled 2017-01-10 (×6): qty 2

## 2017-01-10 MED ORDER — INSULIN ASPART 100 UNIT/ML ~~LOC~~ SOLN: 2.0000 [IU] | Freq: Three times a day (TID) | SUBCUTANEOUS | Status: DC

## 2017-01-10 MED ORDER — HALOPERIDOL 2 MG PO TABS
ORAL_TABLET | ORAL | Status: AC
  Filled 2017-01-10: qty 1

## 2017-01-10 NOTE — ED Notes (Signed)
Pt having her tts at this time
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
## 2017-01-10 NOTE — ED Notes (Signed)
Security called to wand pt  
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
## 2017-01-11 DIAGNOSIS — F250 Schizoaffective disorder, bipolar type: Secondary | ICD-10-CM

## 2017-01-11 DIAGNOSIS — Z87891 Personal history of nicotine dependence: Secondary | ICD-10-CM | POA: Diagnosis not present

## 2017-01-11 DIAGNOSIS — F419 Anxiety disorder, unspecified: Secondary | ICD-10-CM | POA: Diagnosis not present

## 2017-01-11 DIAGNOSIS — F1910 Other psychoactive substance abuse, uncomplicated: Secondary | ICD-10-CM | POA: Diagnosis not present

## 2017-01-11 DIAGNOSIS — R45851 Suicidal ideations: Secondary | ICD-10-CM

## 2017-01-11 DIAGNOSIS — R45850 Homicidal ideations: Secondary | ICD-10-CM

## 2017-01-11 DIAGNOSIS — R440 Auditory hallucinations: Secondary | ICD-10-CM

## 2017-01-11 LAB — CBG MONITORING, ED
GLUCOSE-CAPILLARY: 78 mg/dL (ref 65–99)
GLUCOSE-CAPILLARY: 103 mg/dL — AB (ref 65–99)

## 2017-01-11 NOTE — ED Notes (Signed)
[facility] called and said patient does not meet in patient requirements. They recommend discharge back to group home.

## 2017-01-11 NOTE — ED Notes (Signed)
[facility] [phone]). Spoke to representative that stated, "I already told that woman that someone would be out there in about an hour." I informed representative that it has already been over an hour. She replied someone would be out; no estimated time given.

## 2017-01-11 NOTE — Discharge Instructions (Signed)
If you were given medicines take as directed.  If you are on coumadin or contraceptives realize their levels and effectiveness is altered by many different medicines.  If you have any reaction (rash, tongues swelling, other) to the medicines stop taking and see a physician.    If your blood pressure was elevated in the ER make sure you follow up for management with a primary doctor or return for chest pain, shortness of breath or stroke symptoms.  Please follow up as directed and return to the ER or see a physician for new or worsening symptoms.  Thank you. Vitals:   01/10/17 1615 01/11/17 0649  BP: 127/72 114/63  Pulse: 79 (!) 56  Resp: 20 18  Temp: 98.3 F (36.8 C)   TempSrc: Oral   SpO2: 100% 98%  Weight: (!) 181.4 kg (400 lb)   Height: 5\' 7"  (1.702 m)

## 2017-01-11 NOTE — Progress Notes (Signed)
Per Ferne ReusJustina Okonkwo, NP, the patient does not meet criteria for inpatient treatment. Patient is recommended to discharge back to group home and to [facility] to [facility].  CSW attempted to contact the patient's legal guardian through [employer] [phone]  to notify them of the patient's disposition and recommendations. CSW was forwarded to the confidential voicemail of Hazle CocaVonda Thomas. There was no answer; CSW left HIPAA compliant voicemail.   CS[facility] [phone]  to inform them of the patient's disposition and recommendations. [facility] the patient up within the next hour.   Ronnie DerbyBryan Patraw, RN notified. RN agreed to notify EDP.   Baldo DaubJolan Omarian Jaquith MSW, LCSWA CSW Disposition [phone]

## 2017-01-11 NOTE — Consult Note (Signed)
Telepsych Consultation   Reason for Consult: SI & HI Referring Physician: EDP Patient Identification: Sarah Hurst MRN:  [id number] Principal Diagnosis: <principal problem not specified> Diagnosis:  There are no active problems to display for this patient.   Total Time spent with patient: 30 minutes  Subjective:   Sarah Hurst is a 56 y.o. female patient admitted with Schizoaffective D/O, Bipolar type.  HPI: Per the assessment completed 01/10/17 by Faylene Kurtz: Sarah Hurst is an 56 y.o. female.  01/10/2017 Pt came to ED voluntarily via EMS today after a fall at her [facility]. Pt was reported to make suicidal and homicidal threats per [facility] staff. Pt sts she has AH with commands to harm herself and [facility] staff. Pt sts she does not like her [place] and sts she is egged on by the staff to kill herself. Similar statements have been made each time she comes to the ED which is frequently. Per pt hx, pt has 2 previous suicide attempts with the last attempt 12/13/16 when she walked into traffic and was found in the road by LE. Pt has a hx of cutting. Pt has been to the ED 5 times with similar sx and complaints since 11/13/16 and has been hospitalized 3 times since May 2018 with the last hospitalization in mid-July 2018 at [facility]. Pt appears at baseline based on reported sx, admissions and behavior.  Per pt hx and pt confirmation, she has mental health services through [facility] and [facility]. Pt currnetly lives at [facility] ([facility] [phone].) Pt's guardian is through [place] [phone] ext [phone].)  Per Dr.Wentz, pt left previous [facility] for similar reasons and circumstances.   Per pt hx: 12/26/16 "Delmer Islam an 56 y.o.femalewith a long psychiatric history came to the hospital by EMS after walking into traffic. Pt has a history of threatening this because she does not like the group home she is at and she wants a different place to live. Pt was just admitted to [facility] for the same presentation 10 days ago and has been taking medications as directed. Pt states that she doesn't like "the lady at the group home because she is mean". When asked why the lady is mean she states that she "makes her wake up at 8 am take a shower and get breakfast and she can't lay back down until after 12p." Pt was irritable but coherent and oriented during assessment. When asked if she has auditory or visual hallucinations pt states she hears voices "telling her to hurt herself and sees her dead mom and dad". Pt does not appear to be responding to internal stimuli during this assessment and is up to date on her invega shot. Pt states that she has a history of "cutting on herself" and being hospitalized. She currently has a guardian through [place] [phone]. Pt is currently at her baseline as she has morbid ruminations of suicide that are typical for her. Pt has follow up care with [facility] and support through her guardian and [facility]. Pt denies substance use. Pt does not meet inpatient criteria." Henderson Baltimore, LPC, LCAS (TTS)  "Pt was seen over tele psych machine by this Probation officer and Bedelia Person, [facility] Counselor. Pt resides in a group home. Pt stated she is mad at the group home lady because she is "mean to me and makes me get up at 8 and I can't lay back down until 12." Pt is well known to this hospital and is at  her baseline with chronic suicidal ideation. Pt is somewhat cognitively limited. Pt is psychiatrically cleared for discharge back to her group home. "  On Exam today: Patient was seen, chart reviewed with treatment team. Patient was in bed, awake, alert and oriented. Patient state, "I came to the hospital because I fell". Patient stated that she was attempting to walk away from the group home [facility]) and the [facility] staff started struggling with her to come back. And in the process of restraining her from using her walker, she fell. She  said that she does not want to return to the group home again. She expressing HI towards that group home staff. She stated that she will look for a kitchen knife and stab her self and the group home staff. Patient endorsing auditory hallucination of hearing a man's voice and seeing her dead partner. Of note, patient is well know to this facility for similar presentations in the past. Patient is chronically suicidal and has been to the ED 8 times in the past three months excluding in patient hospitalizations in other facilities. Patient has OP resources program lined up for her including having a guardian through [place] [phone].    Past Psychiatric History: See H&P  Risk to Self: Suicidal Ideation: Yes-Currently Present Suicidal Intent: Yes-Currently Present Is patient at risk for suicide?: Yes Suicidal Plan?: Yes-Currently Present Specify Current Suicidal Plan:  (WALK INTO TRAFFIC OR CUT HERSELF (HX OF CUTTING)) Access to Means: Yes Specify Access to Suicidal Means:  (ACCESS TO [place]) What has been your use of drugs/alcohol within the last 12 months?:  (NONE) How many times?:  (STS 2 ATTEMPTS) Other Self Harm Risks:  (HX OF CUTTING SELF ON ARMS PER PT & PT RECORD) Triggers for Past Attempts: Family contact, Other personal contacts (SON NOT VISTED IN 2 Y; UNHAPPY W STAFF AT [facility]) Intentional Self Injurious Behavior: Cutting Comment - Self Injurious Behavior:  (HX OF CUTTING- STS LAST CUTTING WAS 3 MONTHS AGO) Risk to Others: Homicidal Ideation: Yes-Currently Present Thoughts of Harm to Others: Yes-Currently Present Comment - Thoughts of Harm to Others:  (STS THOUGHTS OF STABBING GH STAFF IN THEIR SLEEP AT NIGHT) Current Homicidal Intent: Yes-Currently Present Current Homicidal Plan: Yes-Currently Present Describe Current Homicidal Plan:  (STS PLAN TO TAKE KITCHEN KNIFE & STABB STAFF WHILE ASLEEP) Access to Homicidal Means: No  (PER RECORD, SHARPS SECURED) Identified Victim:  ([place] STAFF) History of harm to others?: No Assessment of Violence: None Noted Violent Behavior Description:  (NA) Does patient have access to weapons?: No Criminal Charges Pending?: No Does patient have a court date: No Prior Inpatient Therapy: Prior Inpatient Therapy: Yes Prior Therapy Dates:  (MULTIPLE; 3 ADMISSION SINCE MAY 2018 PER HX) Prior Therapy Facilty/Provider(s):  ([facility], [facility], DAVIS) Reason for Treatment:  (SI, SCHIZOAFFECTIVE) Prior Outpatient Therapy: Prior Outpatient Therapy: Yes Prior Therapy Dates:  (STS NO OPT; [facility] HAS [facility]) Prior Therapy Facilty/Provider(s):  ([facility]; [facility]) Reason for Treatment:  (SCHIZOAFFECTIVE D/O) Does patient have an [facility]CT team?: Yes ([facility]) Does patient have Intensive In-House Services?  : No Does patient have [facility] services? : Yes Does patient have [facility] services?: No  Past Medical History:  Past Medical History:  Diagnosis Date  . Asthma   . GERD (gastroesophageal reflux disease)   . Mental disability   . Schizoaffective disorder, bipolar type ([place])   . Sleep apnea     Past Surgical History:  Procedure Laterality  Date  . WRIST SURGERY     Family History: No family history on file. Family Psychiatric  History:   Social History:  History  Alcohol Use No     History  Drug Use No    Social History   Social History  . Marital status: Single    Spouse name: N/A  . Number of children: N/A  . Years of education: N/A   Social History Main Topics  . Smoking status: Former Research scientist (life sciences)  . Smokeless tobacco: Never Used  . Alcohol use No  . Drug use: No  . Sexual activity: Not Asked   Other Topics Concern  . None   Social History Narrative  . None   Additional Social History:    Allergies:  No Known Allergies  Labs:  Results for orders placed or performed during the hospital encounter of 01/10/17 (from the past 48 hour(s))  Rapid  urine drug screen (hospital performed)     Status: None   Collection Time: 01/10/17  4:18 PM  Result Value Ref Range   Opiates NONE DETECTED NONE DETECTED   Cocaine NONE DETECTED NONE DETECTED   Benzodiazepines NONE DETECTED NONE DETECTED   Amphetamines NONE DETECTED NONE DETECTED   Tetrahydrocannabinol NONE DETECTED NONE DETECTED   Barbiturates NONE DETECTED NONE DETECTED    Comment:        DRUG SCREEN FOR MEDICAL PURPOSES ONLY.  IF CONFIRMATION IS NEEDED FOR ANY PURPOSE, NOTIFY LAB WITHIN 5 DAYS.        LOWEST DETECTABLE LIMITS FOR URINE DRUG SCREEN Drug Class       Cutoff (ng/mL) Amphetamine      1000 Barbiturate      200 Benzodiazepine   163 Tricyclics       845 Opiates          300 Cocaine          300 THC              50   Comprehensive metabolic panel     Status: Abnormal   Collection Time: 01/10/17  4:43 PM  Result Value Ref Range   Sodium 143 135 - 145 mmol/L   Potassium 3.7 3.5 - 5.1 mmol/L   Chloride 108 101 - 111 mmol/L   CO2 27 22 - 32 mmol/L   Glucose, Bld 92 65 - 99 mg/dL   BUN 8 6 - 20 mg/dL   Creatinine, Ser 0.77 0.44 - 1.00 mg/dL   Calcium 9.6 8.9 - 10.3 mg/dL   Total Protein 7.0 6.5 - 8.1 g/dL   Albumin 3.6 3.5 - 5.0 g/dL   AST 23 15 - 41 U/L   ALT 12 (L) 14 - 54 U/L   Alkaline Phosphatase 49 38 - 126 U/L   Total Bilirubin 0.6 0.3 - 1.2 mg/dL   GFR calc non Af Amer >60 >60 mL/min   GFR calc Af Amer >60 >60 mL/min    Comment: (NOTE) The eGFR has been calculated using the CKD EPI equation. This calculation has not been validated in all clinical situations. eGFR's persistently <60 mL/min signify possible Chronic Kidney Disease.    Anion gap 8 5 - 15  Ethanol     Status: None   Collection Time: 01/10/17  4:43 PM  Result Value Ref Range   Alcohol, Ethyl (B) <5 <5 mg/dL    Comment:        LOWEST DETECTABLE LIMIT FOR SERUM ALCOHOL IS 5 mg/dL FOR MEDICAL PURPOSES ONLY   Salicylate  level     Status: None   Collection Time: 01/10/17  4:43 PM   Result Value Ref Range   Salicylate Lvl <1.9 2.8 - 30.0 mg/dL  Acetaminophen level     Status: Abnormal   Collection Time: 01/10/17  4:43 PM  Result Value Ref Range   Acetaminophen (Tylenol), Serum <10 (L) 10 - 30 ug/mL    Comment:        THERAPEUTIC CONCENTRATIONS VARY SIGNIFICANTLY. A RANGE OF 10-30 ug/mL MAY BE AN EFFECTIVE CONCENTRATION FOR MANY PATIENTS. HOWEVER, SOME ARE BEST TREATED AT CONCENTRATIONS OUTSIDE THIS RANGE. ACETAMINOPHEN CONCENTRATIONS >150 ug/mL AT 4 HOURS AFTER INGESTION AND >50 ug/mL AT 12 HOURS AFTER INGESTION ARE OFTEN ASSOCIATED WITH TOXIC REACTIONS.   cbc     Status: Abnormal   Collection Time: 01/10/17  4:43 PM  Result Value Ref Range   WBC 7.4 4.0 - 10.5 K/uL   RBC 3.87 3.87 - 5.11 MIL/uL   Hemoglobin 11.4 (L) 12.0 - 15.0 g/dL   HCT 35.1 (L) 36.0 - 46.0 %   MCV 90.7 78.0 - 100.0 fL   MCH 29.5 26.0 - 34.0 pg   MCHC 32.5 30.0 - 36.0 g/dL   RDW 15.3 11.5 - 15.5 %   Platelets 200 150 - 400 K/uL  CBG monitoring, ED     Status: None   Collection Time: 01/11/17  7:16 AM  Result Value Ref Range   Glucose-Capillary 78 65 - 99 mg/dL  CBG monitoring, ED     Status: Abnormal   Collection Time: 01/11/17 12:35 PM  Result Value Ref Range   Glucose-Capillary 103 (H) 65 - 99 mg/dL    Current Facility-Administered Medications  Medication Dose Route Frequency Provider Last Rate Last Dose  . haloperidol (HALDOL) tablet 2 mg  2 mg Oral BID Daleen Bo, MD   2 mg at 01/11/17 0935  . insulin aspart (novoLOG) injection 2-10 Units  2-10 Units Subcutaneous TID WC Daleen Bo, MD      . risperiDONE (RISPERDAL) tablet 1 mg  1 mg Oral BID Daleen Bo, MD   1 mg at 01/11/17 0935  . zolpidem (AMBIEN) tablet 5 mg  5 mg Oral QHS PRN Daleen Bo, MD   5 mg at 01/10/17 2241   Current Outpatient Prescriptions  Medication Sig Dispense Refill  . haloperidol (HALDOL) 1 MG tablet Take 2 mg by mouth 2 (two) times daily.    . insulin lispro (HUMALOG) 100 UNIT/ML  injection Inject 1-12 Units into the skin 4 (four) times daily -  before meals and at bedtime. Patient takes per sliding scale; bg 70-149: 0 units, bg 150-200: 2 units, bg 201-250: 4 units, bg 251-300: 6 units, bg 301-350: 8 units, bg 351-400: 10 units, bg 401+: 12 units and contact medical doctor    . lactobacillus acidophilus (BACID) TABS tablet Take 1 tablet by mouth 3 (three) times daily.    . metroNIDAZOLE (FLAGYL) 500 MG tablet Take 500 mg by mouth 3 (three) times daily.    . Multiple Vitamins-Minerals (MULTIVITAMIN WITH MINERALS) tablet Take 1 tablet by mouth daily.    . ondansetron (ZOFRAN) 4 MG tablet Take 4 mg by mouth every 6 (six) hours as needed for nausea or vomiting.    . risperiDONE (RISPERDAL) 1 MG tablet Take 1 mg by mouth 2 (two) times daily.    Marland Kitchen zolpidem (AMBIEN) 5 MG tablet Take 5 mg by mouth at bedtime as needed for sleep.      Musculoskeletal: UTA via camera  Psychiatric Specialty Exam: Physical Exam  Nursing note and vitals reviewed.   Review of Systems  Psychiatric/Behavioral: Positive for depression, hallucinations, substance abuse and suicidal ideas. Negative for memory loss. The patient is nervous/anxious. The patient does not have insomnia.   All other systems reviewed and are negative.   Blood pressure 114/63, pulse (!) 56, temperature 98.3 F (36.8 C), temperature source Oral, resp. rate 18, height _0  (1.702 m), weight (!) 181.4 kg (400 lb), SpO2 98 %.Body mass index is 62.65 kg/m.  General Appearance: unremarkable  Eye Contact:  Good  Speech:  Clear and Coherent and Normal Rate  Volume:  Normal  Mood:  Euthymic and appropriate  Affect:  Appropriate  Thought Process:  Linear  Orientation:  Full (Time, Place, and Person)  Thought Content:  Tangential  Suicidal Thoughts:  Yes.  with intent/plan  Homicidal Thoughts:  Yes.  with intent/plan  Memory:  Immediate;   Good Recent;   Fair Remote;   Fair  Judgement:  Intact  Insight:  Present   Psychomotor Activity:  Normal  Concentration:  Concentration: Good and Attention Span: Good  Recall:  Good  Fund of Knowledge:  Fair  Language:  Good  Akathisia:  Negative  Handed:  Right  AIMS (if indicated):     Assets:  Communication Skills Desire for Improvement Leisure Time Resilience Social Support  ADL's:  Intact  Cognition:  WNL  Sleep:        Treatment Plan Summary: Plan to discharge patient back to group home  Follow up with [facility] for therapy and medication management Follow up with Social Work consult for Care coordination Take all medications as prescribed Avoid the use of alcohol and/or drugs Stay well hydrated Activity as tolerated Follow up with PCP for any new or existing medical concerns SW notify [facility] director for HI towards staff   Disposition: No evidence of imminent risk to self or others at present.   Patient does not meet criteria for psychiatric inpatient admission. Supportive therapy provided about ongoing stressors. Refer to IOP. Discussed crisis plan, support from social network, calling 911, coming to the Emergency Department, and calling Suicide Hotline.   Vicenta Aly, NP 01/11/2017 1:15 PM

## 2017-01-24 ENCOUNTER — Emergency Department (HOSPITAL_COMMUNITY)
Admission: EM | Admit: 2017-01-24 | Discharge: 2017-01-28 | Disposition: A | Discharge status: Home / Self Care | Payer: Medicaid Other | Attending: Emergency Medicine | Admitting: Emergency Medicine

## 2017-01-24 ENCOUNTER — Encounter (HOSPITAL_COMMUNITY): Payer: Self-pay | Admitting: *Deleted

## 2017-01-24 DIAGNOSIS — Z87891 Personal history of nicotine dependence: Secondary | ICD-10-CM | POA: Insufficient documentation

## 2017-01-24 DIAGNOSIS — J45909 Unspecified asthma, uncomplicated: Secondary | ICD-10-CM | POA: Insufficient documentation

## 2017-01-24 DIAGNOSIS — F79 Unspecified intellectual disabilities: Secondary | ICD-10-CM | POA: Insufficient documentation

## 2017-01-24 DIAGNOSIS — R45851 Suicidal ideations: Secondary | ICD-10-CM | POA: Insufficient documentation

## 2017-01-24 DIAGNOSIS — F250 Schizoaffective disorder, bipolar type: Secondary | ICD-10-CM | POA: Diagnosis not present

## 2017-01-24 DIAGNOSIS — R443 Hallucinations, unspecified: Secondary | ICD-10-CM | POA: Diagnosis present

## 2017-01-24 DIAGNOSIS — Z593 Problems related to living in residential institution: Secondary | ICD-10-CM | POA: Diagnosis not present

## 2017-01-24 DIAGNOSIS — Z79899 Other long term (current) drug therapy: Secondary | ICD-10-CM | POA: Diagnosis not present

## 2017-01-24 DIAGNOSIS — F259 Schizoaffective disorder, unspecified: Secondary | ICD-10-CM | POA: Insufficient documentation

## 2017-01-24 LAB — CBC WITH DIFFERENTIAL/PLATELET
Basophils Absolute: 0 10*3/uL (ref 0.0–0.1)
Basophils Relative: 0 %
Eosinophils Absolute: 0.1 10*3/uL (ref 0.0–0.7)
Eosinophils Relative: 1 %
HEMATOCRIT: 34.2 % — AB (ref 36.0–46.0)
HEMOGLOBIN: 10.9 g/dL — AB (ref 12.0–15.0)
LYMPHS PCT: 25 %
Lymphs Abs: 1.7 10*3/uL (ref 0.7–4.0)
MCH: 28.8 pg (ref 26.0–34.0)
MCHC: 31.9 g/dL (ref 30.0–36.0)
MCV: 90.5 fL (ref 78.0–100.0)
MONOS PCT: 4 %
Monocytes Absolute: 0.3 10*3/uL (ref 0.1–1.0)
NEUTROS ABS: 4.9 10*3/uL (ref 1.7–7.7)
NEUTROS PCT: 70 %
Platelets: 155 10*3/uL (ref 150–400)
RBC: 3.78 MIL/uL — ABNORMAL LOW (ref 3.87–5.11)
RDW: 15.3 % (ref 11.5–15.5)
WBC: 7 10*3/uL (ref 4.0–10.5)

## 2017-01-24 LAB — RAPID URINE DRUG SCREEN, HOSP PERFORMED
Amphetamines: NOT DETECTED
BARBITURATES: NOT DETECTED
Benzodiazepines: NOT DETECTED
COCAINE: NOT DETECTED
Opiates: NOT DETECTED
Tetrahydrocannabinol: NOT DETECTED

## 2017-01-24 LAB — ETHANOL: Alcohol, Ethyl (B): <5 mg/dL (ref <5)

## 2017-01-24 LAB — CBG MONITORING, ED: Glucose-Capillary: 67 mg/dL (ref 65–99)

## 2017-01-24 LAB — COMPREHENSIVE METABOLIC PANEL
ALBUMIN: 3.5 g/dL (ref 3.5–5.0)
ALK PHOS: 48 U/L (ref 38–126)
ALT: 11 U/L — ABNORMAL LOW (ref 14–54)
ANION GAP: 6 (ref 5–15)
AST: 21 U/L (ref 15–41)
BILIRUBIN TOTAL: 0.4 mg/dL (ref 0.3–1.2)
BUN: 13 mg/dL (ref 6–20)
CALCIUM: 9.4 mg/dL (ref 8.9–10.3)
CO2: 28 mmol/L (ref 22–32)
Chloride: 109 mmol/L (ref 101–111)
Creatinine, Ser: 0.73 mg/dL (ref 0.44–1.00)
GFR calc Af Amer: >60 mL/min (ref >60)
GLUCOSE: 95 mg/dL (ref 65–99)
Potassium: 4.1 mmol/L (ref 3.5–5.1)
Sodium: 143 mmol/L (ref 135–145)
TOTAL PROTEIN: 6.8 g/dL (ref 6.5–8.1)

## 2017-01-24 MED ORDER — RISPERIDONE 1 MG PO TABS
1.0000 mg | ORAL_TABLET | Freq: Two times a day (BID) | ORAL | Status: DC
  Administered 2017-01-24 – 2017-01-28 (×7): 1 mg via ORAL
  Filled 2017-01-24 (×11): qty 1

## 2017-01-24 MED ORDER — HALOPERIDOL 2 MG PO TABS
2.0000 mg | ORAL_TABLET | Freq: Two times a day (BID) | ORAL | Status: DC
  Administered 2017-01-24 – 2017-01-28 (×7): 2 mg via ORAL
  Filled 2017-01-24 (×11): qty 1

## 2017-01-24 MED ORDER — FAMOTIDINE 20 MG PO TABS
20.0000 mg | ORAL_TABLET | Freq: Every day | ORAL | Status: DC
  Administered 2017-01-24 – 2017-01-28 (×5): 20 mg via ORAL
  Filled 2017-01-24 (×5): qty 1

## 2017-01-24 NOTE — ED Triage Notes (Signed)
States she was able to sneak into the kitchen and get a knife while the caretaker was sleeping at the assisted living home.

## 2017-01-24 NOTE — ED Provider Notes (Signed)
[facility]-EMERGENCY DEPT Provider Note   CSN: [id number] Arrival date & time: 01/24/17  1549     History   Chief Complaint Chief Complaint  Patient presents with  . Suicidal    HPI Sarah Hurst is a 56 y.o. female.  HPI Patient presents for suicidal ideation. She's been seen multiple times for similar complaints. States she is unhappy with her caretaker. While caretaker took a nap today patient took a steak knife and walked out of the group home. She was picked up by police. Patient states she had intent to harm herself with a knife. She also admits to homicidal intent towards her caretaker. States she is hearing voices telling her to do both of these things. Denies any drug or alcohol use.    Past Medical History:  Diagnosis Date  . Asthma   . GERD (gastroesophageal reflux disease)   . Mental disability   . Schizoaffective disorder, bipolar type ([facility])   . Sleep apnea     There are no active problems to display for this patient.   Past Surgical History:  Procedure Laterality Date  . WRIST SURGERY      OB History    No data available       Home Medications    Prior to Admission medications   Medication Sig Start Date End Date Taking? Authorizing Provider  acetaminophen (TYLENOL) 500 MG tablet Take 1,000 mg by mouth every 4 (four) hours as needed for moderate pain, fever or headache.   Yes [provider]  famotidine (PEPCID) 20 MG tablet Take 20 mg by mouth daily.   Yes [provider]  haloperidol (HALDOL) 1 MG tablet Take 2 mg by mouth 2 (two) times daily.   Yes [provider]  lactobacillus acidophilus (BACID) TABS tablet Take 1 tablet by mouth 3 (three) times daily.   Yes [provider]  loperamide (ANTI-DIARRHEAL) 2 MG capsule Take 2 mg by mouth daily as needed for diarrhea or loose stools.   Yes [provider]  ondansetron (ZOFRAN) 4 MG tablet Take 4 mg by mouth every 6 (six) hours as needed for nausea or  vomiting.   Yes [provider]  risperiDONE (RISPERDAL) 1 MG tablet Take 1 mg by mouth 2 (two) times daily.   Yes [provider]  insulin lispro (HUMALOG) 100 UNIT/ML injection Inject 1-12 Units into the skin 4 (four) times daily -  before meals and at bedtime. Patient takes per sliding scale; bg 70-149: 0 units, bg 150-200: 2 units, bg 201-250: 4 units, bg 251-300: 6 units, bg 301-350: 8 units, bg 351-400: 10 units, bg 401+: 12 units and contact medical doctor    [provider]    Family History No family history on file.  Social History Social History  Substance Use Topics  . Smoking status: Former Games developer  . Smokeless tobacco: Never Used  . Alcohol use No     Allergies   Patient has no known allergies.   Review of Systems Review of Systems  Constitutional: Negative for chills and fever.  Respiratory: Negative for shortness of breath.   Cardiovascular: Negative for chest pain.  Gastrointestinal: Negative for nausea and vomiting.  Musculoskeletal: Negative for back pain and neck pain.  Skin: Negative for rash and wound.  Neurological: Negative for weakness, numbness and headaches.  Psychiatric/Behavioral: Positive for hallucinations and suicidal ideas.  All other systems reviewed and are negative.    Physical Exam Updated Vital Signs BP 101/68   Pulse 68  Temp 97.7 F (36.5 C) (Oral)   Resp 18   Ht 5\' 7"  (1.702 m)   Wt (!) 143.8 kg (317 lb)   SpO2 98%   BMI 49.65 kg/m   Physical Exam  Constitutional: She is oriented to person, place, and time. She appears well-developed and well-nourished. No distress.  HENT:  Head: Normocephalic and atraumatic.  Mouth/Throat: Oropharynx is clear and moist. No oropharyngeal exudate.  Eyes: Pupils are equal, round, and reactive to light. EOM are normal.  Neck: Normal range of motion. Neck supple.  Cardiovascular: Normal rate and regular rhythm.  Exam reveals no friction rub.   No murmur  heard. Pulmonary/Chest: Effort normal and breath sounds normal. No respiratory distress. She has no wheezes. She has no rales. She exhibits no tenderness.  Abdominal: Soft. Bowel sounds are normal. There is no tenderness. There is no rebound and no guarding.  Musculoskeletal: Normal range of motion. She exhibits no edema or tenderness.  Neurological: She is alert and oriented to person, place, and time.  Moving all extremities without focal deficit. Sensation intact.  Skin: Skin is warm and dry. No rash noted. No erythema.  Psychiatric:  Blunt affect. Admits to SI/HI. Does not appear to be responding to internal stimuli.  Nursing note and vitals reviewed.    ED Treatments / Results  Labs (all labs ordered are listed, but only abnormal results are displayed) Labs Reviewed  CBC WITH DIFFERENTIAL/PLATELET - Abnormal; Notable for the following:       Result Value   RBC 3.78 (*)    Hemoglobin 10.9 (*)    HCT 34.2 (*)    All other components within normal limits  COMPREHENSIVE METABOLIC PANEL - Abnormal; Notable for the following:    ALT 11 (*)    All other components within normal limits  ETHANOL  RAPID URINE DRUG SCREEN, HOSP PERFORMED    EKG  EKG Interpretation None       Radiology No results found.  Procedures Procedures (including critical care time)  Medications Ordered in ED Medications  famotidine (PEPCID) tablet 20 mg (20 mg Oral Given 01/24/17 1812)  haloperidol (HALDOL) tablet 2 mg (not administered)  risperiDONE (RISPERDAL) tablet 1 mg (not administered)     Initial Impression / Assessment and Plan / ED Course  I have reviewed the triage vital signs and the nursing notes.  Pertinent labs & imaging results that were available during my care of the patient were reviewed by me and considered in my medical decision making (see chart for details).    Patient is medically cleared for TTS evaluation.   Final Clinical Impressions(s) / ED Diagnoses   Final  diagnoses:  Suicidal ideation    New Prescriptions New Prescriptions   No medications on file     Loren Racer, MD 01/24/17 2110

## 2017-01-24 NOTE — ED Notes (Signed)
Pt's 2 gowns placed in pt belonging bag with name sticker on it and placed in pt locker.

## 2017-01-24 NOTE — ED Notes (Signed)
Notified charge nurse we do not have any paper scrubs that will fit the patient.  Charge nurse advised that we would wait until TTS done.  If patient is to be placed, we would obtain a large gown.  Patient was wanded by security twice.  Patient has very loose shift with no undercloths.

## 2017-01-24 NOTE — ED Provider Notes (Signed)
2255:  TTS has evaluated pt: inpt Geropsych recommended. Placement pending.    Samuel Jester, DO 01/24/17 2255
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
## 2017-01-25 NOTE — ED Notes (Signed)
Pt given lunch tray at this time

## 2017-01-25 NOTE — Progress Notes (Addendum)
Patient was recommended inpatient treatment per Nira Conn NP on 01/24/17.  Patient has been referred to the following inpatient psychiatric treatment programs at:   [facility], [place], [place], [facility] (for the [place] location), [place].  [facility] - patient can not be referred here due to not able to complete ADL's independently.  At capacity: [address], [place], [place], [place], [place], [place], and [place].  CSW in disposition will continue to seek placement for this patient.  Melbourne Abts, MSW, LCSWA Clinical social worker in disposition [facility], TTS Office [phone] and [phone] 01/25/2017 4:35 PM

## 2017-01-25 NOTE — ED Notes (Signed)
Paige from [facility] called at this time stating [facility] does not take Medicaid and pt would have to pay $1500/day. Notified pt and asked if she would like to go to [facility] and pay this amount per day. Pt states she cannot afford this and would wish to continue looking for other placement. [facility] notified.

## 2017-01-25 NOTE — ED Notes (Signed)
Pt sleeping.  Advised sitter to let me know when she was awake for her medications.

## 2017-01-25 NOTE — BHH Counselor (Signed)
Per Tim at [facility], they don't accept Medicaid for geriatric patients. The cost would be $1500 per day if pt was to be admitted on a self pay basis. Writer spoke with pt's RN Maralyn Sago. Pt told Maralyn Sago she can't afford to pay without insurance.   Evette Cristal, [place] Therapeutic Triage Specialist

## 2017-01-25 NOTE — ED Notes (Signed)
TTS monitor at bedside 

## 2017-01-25 NOTE — ED Notes (Signed)
Bedside commode taken from pt's room at this time as pt is ambulatory. Bedside cleaning performed. Environmental services to remove sharps box at this time.
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
## 2017-01-25 NOTE — ED Notes (Signed)
Pt given dinner tray at this time.  

## 2017-01-26 LAB — URINALYSIS, ROUTINE W REFLEX MICROSCOPIC
Bilirubin Urine: NEGATIVE
GLUCOSE, UA: NEGATIVE mg/dL
Hgb urine dipstick: NEGATIVE
KETONES UR: NEGATIVE mg/dL
LEUKOCYTES UA: NEGATIVE
NITRITE: NEGATIVE
PH: 5 (ref 5.0–8.0)
Protein, ur: NEGATIVE mg/dL
SPECIFIC GRAVITY, URINE: 1.016 (ref 1.005–1.030)

## 2017-01-26 NOTE — BH Assessment (Signed)
Pt reassessed this am. She is wearing a hospital gown. Pt is cooperative and her affect is flat. She continues to endorse SI. She also says that she wants to kill the lady that works at her group home. Pt says she didn't sleep that good. She reports her appetite is good. Pt says she hears "voices that have been telling me to hurt myself." She also reports that she sees visions of her deceased dad and mom. PT continues to meet criteria for inpatient placement and TTS has referred pt to multiple gerospych facilities.  Evette Cristal, [place] Therapeutic Triage Specialist

## 2017-01-26 NOTE — Progress Notes (Signed)
Writer followed up with placement efforts at [facility]. Per behavioral health intake RN Sundra Aland, patient has been declined due to need for long-term care.  Melbourne Abts, MSW, LCSWA Clinical social worker in disposition [facility], TTS Office [phone] and [phone] 01/26/2017 9:05 AM

## 2017-01-26 NOTE — Progress Notes (Signed)
Patient was declined IP treatment at [facility] due to acuity, per RN Greggory Stallion.  Melbourne Abts, MSW, LCSWA Clinical social worker in disposition [facility], TTS Office [phone] and [phone] 01/26/2017 11:35 AM

## 2017-01-26 NOTE — ED Notes (Signed)
Breakfast tray provided. 

## 2017-01-26 NOTE — BH Assessment (Signed)
Received call from [facility] requesting urinalysis. Notified Lawson Fiscal, RN of request.   Harlin Rain Patsy [place], [facility], [facility], [facility] Triage Specialist [phone]

## 2017-01-26 NOTE — Progress Notes (Signed)
RN Greggory Stallion at [facility] requested patient's medication administration list, doctor's and nurse's notes. Also inquired about legal guardianship. Requested information was discussed with [facility] RN Victorino Dike. This Clinical research associate sent the requested clinicals to [place]. Awaiting call back regarding referral.  Melbourne Abts, MSW, LCSWA Clinical social worker in disposition [facility], TTS Office [phone] and [phone] 01/26/2017 10:48 AM

## 2017-01-26 NOTE — ED Notes (Signed)
Pt assisted to bedside toilet

## 2017-01-26 NOTE — Progress Notes (Addendum)
Patient is on the waitlist at [facility], on 8/26, per intake [place].  Referral to the following inpatient psychiatric treatment facilities was followed up:  [place] and [place] - per intake, both facilities accepting referrals today. Referrals faxed. [facility] - no answer. Writer contacted multiple times and will continue to follow up through the day. [facility] is not an option for patient due to adult medicaid. [facility] - patient can not be referred here due to not able to complete ADL's independently.  Declined at: [facility] - per [facility], due need for long-term care. [facility] - due adult medicaid, patient would need to pay $1500/day out of pocket. Pt declined.  At capacity: [address], [place], [place], [place], [place], [place], and [place].  CSW in disposition will continue to seek placement for this patient.  Melbourne Abts, MSW, LCSWA Clinical social worker in disposition [facility], TTS Office [phone] and [phone] 01/26/2017 9:57 AM

## 2017-01-26 NOTE — ED Notes (Signed)
Lunch tray provided. 

## 2017-01-27 NOTE — ED Notes (Addendum)
Myself and the sitter ambulated this pt in the hallway. Pt tolerated well with no complaints. Sat pt back in the recliner and dinner tray was given.

## 2017-01-27 NOTE — ED Notes (Signed)
Pt assisted to recliner in the room with sitter and charge RN.  Bed linens changed. Pt comfortable at this time, purewick in place.

## 2017-01-27 NOTE — ED Notes (Signed)
Spoke with Nannie, pts caregiver to update her on plan.  Told to call administrator, Larkin Ina [phone] to update her on plan of care.  Pt guardian is Hazle Coca, [phone].

## 2017-01-27 NOTE — ED Notes (Signed)
Received report on pt, pt lying on right side, no distress noted, resp even and non labored, sitter remains at bedside,

## 2017-01-27 NOTE — ED Notes (Signed)
Pt states she wants to leave, doesn't want to wait on a room anymore, tells staff to call her a ride. Discussed with Dr. Deretha Emory and he will come talk to pt soon.

## 2017-01-27 NOTE — ED Notes (Signed)
All linens changed, pt given bed bath. Pt placed in clean gown. Pure wick catheter reapplied, per Georgann Housekeeper, RN. Pt currently resting in bed, calm and cooperative.

## 2017-01-27 NOTE — ED Notes (Signed)
Meal given

## 2017-01-27 NOTE — ED Notes (Signed)
Pt removed external pure wick catheter. Pt then urinated all over bed. RN Georgann Housekeeper notified.

## 2017-01-27 NOTE — ED Notes (Signed)
Dr. Deretha Emory speaking with pt now.

## 2017-01-27 NOTE — BH Assessment (Signed)
[facility] Assessment Progress Note  Pt reassessed this AM. Pt indicates that she feels the "same way". When asked to clarify, pt states that she still feels "suicidal, like I want to kill myself". Pt acknowledges that she has been taking her meds in the ED. Pt also reports hearing a man's voice telling her to kill herself-she last heard this voice last night. Pt also endorses HI to stab the caretaker at the assisted living  B/c she's "mean and hateful to me". IP treatment still recommended.    Johny Shock. Ladona Ridgel, MS, NCC, LPCA Counselor

## 2017-01-27 NOTE — ED Notes (Signed)
Pt given breakfast tray

## 2017-01-27 NOTE — ED Notes (Signed)
Pt states sitter washed her off this morning right before shift change.

## 2017-01-28 DIAGNOSIS — R45851 Suicidal ideations: Secondary | ICD-10-CM

## 2017-01-28 DIAGNOSIS — F419 Anxiety disorder, unspecified: Secondary | ICD-10-CM | POA: Diagnosis not present

## 2017-01-28 DIAGNOSIS — Z87891 Personal history of nicotine dependence: Secondary | ICD-10-CM | POA: Diagnosis not present

## 2017-01-28 DIAGNOSIS — R45850 Homicidal ideations: Secondary | ICD-10-CM

## 2017-01-28 DIAGNOSIS — R443 Hallucinations, unspecified: Secondary | ICD-10-CM

## 2017-01-28 DIAGNOSIS — R450 Nervousness: Secondary | ICD-10-CM | POA: Diagnosis not present

## 2017-01-28 DIAGNOSIS — F1910 Other psychoactive substance abuse, uncomplicated: Secondary | ICD-10-CM

## 2017-01-28 DIAGNOSIS — F250 Schizoaffective disorder, bipolar type: Secondary | ICD-10-CM | POA: Diagnosis not present

## 2017-01-28 DIAGNOSIS — Z593 Problems related to living in residential institution: Secondary | ICD-10-CM

## 2017-01-28 NOTE — Progress Notes (Signed)
CSW contacted [facility] Jeronimo Norma [phone]) to inquire about pt pick-up.  Asked to call [facility] as someone from there is apparently picking patient up.  CSW called [facility] [phone] and spoke to a gentleman there, who stated that "someone should be at the hospital" right now.    CSW notes pt is now d/c'd  Carney Bern T. Kaylyn Lim, MSW, LCSWA Disposition Clinical Social Work [phone] (cell) [phone] (office)

## 2017-01-28 NOTE — ED Notes (Signed)
Report given to Lyman Bishop, caregiver at [facility], and was told someone will pick up pt in approx 1 1/2 hours.

## 2017-01-28 NOTE — ED Notes (Signed)
TTS in progress 

## 2017-01-28 NOTE — Progress Notes (Signed)
Per Ferne Reus, NP, the patient does not meet criteria for inpatient treatment. Patient is recommended to discharge back to group home and follow up with outpatient providers.    Patient is a resident at [facility] ([facility] [phone]).   Disposition CSW consulted with [facility] CSW, Mordecai Rasmussen, LCSW regarding patient wanting to go to another group home. Per Dahlia Client, she was going to follow up with the patient's guardian, [employer] [phone].) and she was going to contact [facility] to determine whether or not the patient has a care coordinator.   Disposition CSW contacted  [facility] [phone]) and spoke with Ms. Nanny Hope.   Ms. Bridgette Habermann stated she would have to call and see who was available to pick the patient up and that she would call CSW back with a time.   Disposition CSW will continue to follow for a pick up time.   Baldo Daub MSW, LCSWA CSW Disposition [phone]

## 2017-01-28 NOTE — ED Notes (Signed)
Pt transported by wheelchair to shower with security accompanying. Pt ambulatory, uses shower chair, able to stand and shower with minimal assistance.

## 2017-01-28 NOTE — Progress Notes (Signed)
LCSW received call from Jolan with [facility] Disposition.  Patient has been psychiatrically cleared at this time to return to group home. Provider questioning if patient can be placed in a new group home due to continued behaviors associated with current group home.  Patient only option per guardian who was contacted is the current group home. LCSW probed for innovation waiver wait list in which guardian reports she is on the waitlist, however if needing new group home or higher level of care, authorization would come from [facility]. Patient has a care coordinator.  LCSW inquired about IDD placement since current level of care allows her to walk outside, up and down the highway.  Guardian reports that no other place will take her.   Patient also has ACT services involved who follow her at the group home for continued care. Plan:   No other options for patient at this time for placement and patient to return to group home per guardian: Sarah Hurst:  [phone] with [employer] Guardianship services    Sarah Hurst, MSW Clinical Social Work: System Wide Float Coverage for :  [phone]

## 2017-01-28 NOTE — ED Notes (Signed)
TTS machine put in pt room at this time.

## 2017-01-28 NOTE — Discharge Instructions (Signed)
Patient cleared by behavioral health to return back to group home.

## 2017-01-28 NOTE — Consult Note (Signed)
Telepsych Consultation   Reason for Consult: SI & HI Referring Physician: EDP Patient Identification: Sarah Hurst MRN:  [id number] Principal Diagnosis: <principal problem not specified> Diagnosis:  There are no active problems to display for this patient.   Total Time spent with patient: 30 minutes  Subjective:   Sarah Hurst is a 56 y.o. female patient admitted with Schizoaffective D/O, Bipolar type.  HPI: Per the assessment completed on 01/24/17 by Beatriz Stallion: -Clinician reviewed note by Dr. Ranae Palms.  Patient presents for suicidal ideation. She's been seen multiple times for similar complaints. States she is unhappy with her caretaker. While caretaker took a nap today patient took asteak knife and walked out of the group home. She was picked up by police. Patient states she had intent to harm herself with a knife. She also admits to homicidal intent towards her caretaker. States she is hearing voices telling her to do both of these things. Denies any drug or alcohol use.  Patient still endorses thoughts of killing herself by stabbing herself.  Patient says she wants to kill one of the staff persons at [facility] by stabbing her in her sleep.  Patient says that she hears voices telling her to kill self and to kill staff persons.  There is one particular staff she wants to kill because she feels she is treated unfairly by the staff person.  Patient says if she does these things she will just tell people that voices told her to do it.  Patient says that she has not seen her son in over two years.  She says that her sister found out that he is living in [place].  Patient does not know where however.  Patient says that she is depressed about having to live in assisted living facilities and not seeing her son.  These are the main stressors for her.  Patient says she goes to [facility] in [place].  She also has ACTT team services from [facility].  Patient reports  that she was at [facility] in June of 2018.  Pt has had multiple suicide attempts in the past.  Per the assessment completed 01/10/17 by Beryle Flock: Sarah Hurst is an 56 y.o. female.  01/10/2017 Pt came to ED voluntarily via EMS today after a fall at her [facility]. Pt was reported to make suicidal and homicidal threats per [facility] staff. Pt sts she has AH with commands to harm herself and [facility] staff. Pt sts she does not like her GH and sts she is egged on by the staff to kill herself. Similar statements have been made each time she comes to the ED which is frequently. Per pt hx, pt has 2 previous suicide attempts with the last attempt 12/13/16 when she walked into traffic and was found in the road by LE. Pt has a hx of cutting. Pt has been to the ED 5 times with similar sx and complaints since 11/13/16 and has been hospitalized 3 times since May 2018 with the last hospitalization in mid-July 2018 at [place]. Pt appears at baseline based on reported sx, admissions and behavior.  Per pt hx and pt confirmation, she has mental health services through [facility] and [facility]. Pt currnetly lives at [facility] ([facility] [phone].) Pt's guardian is through [employer] [phone].)  Per Dr.Wentz, pt left previous GH for similar reasons and circumstances.   Per pt hx: 12/26/16 "Sarah Hurst an 56 y.o.femalewith a long psychiatric history came to the hospital by EMS after walking  into traffic. Pt has a history of threatening this because she does not like the group home she is at and she wants a different place to live. Pt was just admitted to [facility] for the same presentation 10 days ago and has been taking medications as directed. Pt states that she doesn't like "the lady at the group home because she is mean". When asked why the lady is mean she states that she "makes her wake up at 8 am take a shower and get breakfast and she can't lay back down until after 12p." Pt  was irritable but coherent and oriented during assessment. When asked if she has auditory or visual hallucinations pt states she hears voices "telling her to hurt herself and sees her dead mom and dad". Pt does not appear to be responding to internal stimuli during this assessment and is up to date on her invega shot. Pt states that she has a history of "cutting on herself" and being hospitalized. She currently has a guardian through [employer], [place] [phone]. Pt is currently at her baseline as she has morbid ruminations of suicide that are typical for her. Pt has follow up care with [facility] and support through her guardian and [facility]. Pt denies substance use. Pt does not meet inpatient criteria." Sarah Hurst, LPC, LCAS (TTS)  "Pt was seen over tele psych machine by this Clinical research associate and Kateri Plummer, [facility] Counselor. Pt resides in a group home. Pt stated she is mad at the group home lady because she is "mean to me and makes me get up at 8 and I can't lay back down until 12." Pt is well known to this hospital and is at her baseline with chronic suicidal ideation. Pt is somewhat cognitively limited. Pt is psychiatrically cleared for discharge back to her group home. "  On the evaluation completed by this writer on 01/11/17:  Patient was seen, chart reviewed with treatment team. Patient was in bed, awake, alert and oriented. Patient state, "I came to the hospital because I fell". Patient stated that she was attempting to walk away from the group home [facility]) and the [facility] staff started struggling with her to come back. And in the process of restraining her from using her walker, she fell. She said that she does not want to return to the group home again. She expressing HI towards that group home staff. She stated that she will look for a kitchen knife and stab her self and the group home staff. Patient endorsing auditory hallucination of hearing a man's voice and seeing her dead  partner. Of note, patient is well know to this facility for similar presentations in the past. Patient is chronically suicidal and has been to the ED 8 times in the past three months excluding in patient hospitalizations in other facilities. Patient has OP resources program lined up for her including having a guardian through [employer] [place], [place] [phone].    On my evaluation today, 01/28/17: Patient was seen via tele-psych, chart reviewed with treatment team. Patient in bed, awake, alert and oriented x4. Patient reiterated the reason for this hospital admission as documented above. Patient stated, "I came to the hospital because I was feeling suicidal and I took out a steak knife to hurt myself". Patient is well know to this facility for similar presentations. Patient reporting that she needs help to move out of her current residential facility because she doesn't like the way they  treat her. She stated that they don't allow her to return to bed after her morning routines. Patient also stating her biggest stressor currently is not being able to see her son for 2 years. She said she spoke to her sister who agrees to give her number to her son the next time she comes in contact with him. Patient continues to endorsing chronic SI/HI/VAH. Stating SI to stab self, HI towards a group home staff, [facility] with plan to strangle or stab her, visual and auditory hallucinations of seeing and hearing from her deceased parents and saying that they say positive things to her. Patient requesting to be sent to [facility] for medication management to get the SI thoughts out of her. Patient agrees to keeping herself and people around her safe. SW to assist with getting patient to a new facility per her request.  Past Psychiatric History: See H&P  Risk to Self: Suicidal Ideation: Yes-Currently Present Suicidal Intent: Yes-Currently Present Is patient at risk for suicide?: Yes Suicidal Plan?:  Yes-Currently Present Specify Current Suicidal Plan: Stab herself w/ knife Access to Means: Yes Specify Access to Suicidal Means: Sharps What has been your use of drugs/alcohol within the last 12 months?: None How many times?:  (Multiple attempts.) Other Self Harm Risks: yes Triggers for Past Attempts: Family contact, Other personal contacts Intentional Self Injurious Behavior: Cutting Comment - Self Injurious Behavior: Last time three months ago Risk to Others: Homicidal Ideation: Yes-Currently Present Thoughts of Harm to Others: Yes-Currently Present Comment - Thoughts of Harm to Others:  (Pt wants to stab staff in sleep then claim voices told her) Current Homicidal Intent: Yes-Currently Present Current Homicidal Plan: Yes-Currently Present Describe Current Homicidal Plan: Wants to stab staff persons. Access to Homicidal Means: No (Patient had sneaked and gotten a knife today.) Identified Victim: Staff person at [facility] History of harm to others?: No Assessment of Violence: None Noted Violent Behavior Description: "No really." Does patient have access to weapons?: No (Pt sneaked and got a steak knife.) Criminal Charges Pending?: No Does patient have a court date: No Prior Inpatient Therapy: Prior Inpatient Therapy: Yes Prior Therapy Dates: June '18 Prior [facility] Facilty[facility]Provider[facility]): [facility] Reason for Treatment: SI Prior Outpatient Therapy: Prior Outpatient Therapy: Yes Prior Therapy Dates: ongoing  Prior Therapy Facilty/Provider(s): [facility] in [place] Reason for Treatment: schizoaffective disorder Does patient have an ACCT team?: Yes ([facility].) Does patient have Intensive In-House Services?  : No Does patient have [facility] services? : Yes Does patient have P4CC services?: No  Past Medical History:  Past Medical History:  Diagnosis Date  . Asthma   . GERD (gastroesophageal reflux disease)   . Mental disability   . Schizoaffective disorder,  bipolar type (HCC)   . Sleep apnea     Past Surgical History:  Procedure Laterality Date  . WRIST SURGERY     Family History: No family history on file. Family Psychiatric  History:   Social History:  History  Alcohol Use No     History  Drug Use No    Social History   Social History  . Marital status: Single    Spouse name: N/A  . Number of children: N/A  . Years of education: N/A   Social History Main Topics  . Smoking status: Former Games developer  . Smokeless tobacco: Never Used  . Alcohol use No  . Drug use: No  . Sexual activity: Not Asked   Other Topics Concern  . None   Social History  Narrative  . None   Additional Social History:    Allergies:  No Known Allergies  Labs:  No results found for this or any previous visit (from the past 48 hour(s)).  Current Facility-Administered Medications  Medication Dose Route Frequency Provider Last Rate Last Dose  . famotidine (PEPCID) tablet 20 mg  20 mg Oral Daily Loren Racer, MD   20 mg at 01/28/17 4098  . haloperidol (HALDOL) tablet 2 mg  2 mg Oral BID Loren Racer, MD   2 mg at 01/28/17 0943  . risperiDONE (RISPERDAL) tablet 1 mg  1 mg Oral BID Loren Racer, MD   1 mg at 01/28/17 1191   Current Outpatient Prescriptions  Medication Sig Dispense Refill  . acetaminophen (TYLENOL) 500 MG tablet Take 1,000 mg by mouth every 4 (four) hours as needed for moderate pain, fever or headache.    . famotidine (PEPCID) 20 MG tablet Take 20 mg by mouth daily.    . haloperidol (HALDOL) 1 MG tablet Take 2 mg by mouth 2 (two) times daily.    Marland Kitchen lactobacillus acidophilus (BACID) TABS tablet Take 1 tablet by mouth 3 (three) times daily.    Marland Kitchen loperamide (ANTI-DIARRHEAL) 2 MG capsule Take 2 mg by mouth daily as needed for diarrhea or loose stools.    . ondansetron (ZOFRAN) 4 MG tablet Take 4 mg by mouth every 6 (six) hours as needed for nausea or vomiting.    . risperiDONE (RISPERDAL) 1 MG tablet Take 1 mg by mouth 2 (two)  times daily.    . insulin lispro (HUMALOG) 100 UNIT/ML injection Inject 1-12 Units into the skin 4 (four) times daily -  before meals and at bedtime. Patient takes per sliding scale; bg 70-149: 0 units, bg 150-200: 2 units, bg 201-250: 4 units, bg 251-300: 6 units, bg 301-350: 8 units, bg 351-400: 10 units, bg 401+: 12 units and contact medical doctor      Musculoskeletal: UTA via camera  Psychiatric Specialty Exam: Physical Exam  Nursing note and vitals reviewed.   Review of Systems  Psychiatric/Behavioral: Positive for depression, hallucinations, substance abuse and suicidal ideas. Negative for memory loss. The patient is nervous/anxious. The patient does not have insomnia.   All other systems reviewed and are negative.   Blood pressure 109/86, pulse 61, temperature 97.6 F (36.4 C), temperature source Oral, resp. rate 19, height 5\' 7"  (1.702 m), weight (!) 143.8 kg (317 lb), SpO2 100 %.Body mass index is 49.65 kg/m.  General Appearance: unremarkable  Eye Contact:  Good  Speech:  Clear and Coherent and Normal Rate  Volume:  Normal  Mood:  Euthymic and appropriate  Affect:  Appropriate  Thought Process:  Linear  Orientation:  Full (Time, Place, and Person)  Thought Content:  Tangential  Suicidal Thoughts:  Yes.  with intent/plan  Homicidal Thoughts:  Yes.  with intent/plan  Memory:  Immediate;   Good Recent;   Fair Remote;   Fair  Judgement:  Intact  Insight:  Present and Shallow  Psychomotor Activity:  Normal  Concentration:  Concentration: Good and Attention Span: Good  Recall:  Good  Fund of Knowledge:  Fair  Language:  Good  Akathisia:  Negative  Handed:  Right  AIMS (if indicated):     Assets:  Communication Skills Desire for Improvement Leisure Time Resilience Social Support  ADL's:  Intact  Cognition:  WNL  Sleep:        Treatment Plan Summary: Plan to discharge patient back to group home  [facility] up with [place] [facility] for  therapy and medication management Follow up with Social Work consult for Care coordination and with placement Take all medications as prescribed Avoid the use of alcohol and/or drugs Stay well hydrated Activity as tolerated Follow up with PCP for any new or existing medical concerns SW notify [facility] director for HI towards staff   Disposition: No evidence of imminent risk to self or others at present.   Patient does not meet criteria for psychiatric inpatient admission. Supportive therapy provided about ongoing stressors. Refer to IOP. Discussed crisis plan, support from social network, calling 911, coming to the Emergency Department, and calling Suicide Hotline.   Delila Pereyra, NP 01/28/2017 2:10 PM

## 2017-02-14 ENCOUNTER — Ambulatory Visit: Payer: Self-pay | Admitting: Obstetrics & Gynecology

## 2017-02-28 ENCOUNTER — Ambulatory Visit: Payer: Self-pay | Admitting: Obstetrics & Gynecology

## 2017-03-14 ENCOUNTER — Ambulatory Visit: Payer: Self-pay | Admitting: Obstetrics and Gynecology

## 2017-03-20 ENCOUNTER — Ambulatory Visit: Payer: Self-pay | Admitting: Podiatry

## 2017-03-24 ENCOUNTER — Emergency Department: Payer: Medicaid Other

## 2017-03-24 ENCOUNTER — Inpatient Hospital Stay
Admission: EM | Admit: 2017-03-24 | Discharge: 2017-04-08 | DRG: 071 | Disposition: A | Discharge status: Skilled Nursing Facility | Payer: Medicaid Other | Attending: Internal Medicine | Admitting: Internal Medicine

## 2017-03-24 ENCOUNTER — Encounter: Payer: Self-pay | Admitting: Emergency Medicine

## 2017-03-24 DIAGNOSIS — R4781 Slurred speech: Secondary | ICD-10-CM

## 2017-03-24 DIAGNOSIS — I959 Hypotension, unspecified: Secondary | ICD-10-CM | POA: Diagnosis present

## 2017-03-24 DIAGNOSIS — R001 Bradycardia, unspecified: Secondary | ICD-10-CM | POA: Diagnosis present

## 2017-03-24 DIAGNOSIS — R0689 Other abnormalities of breathing: Secondary | ICD-10-CM | POA: Diagnosis present

## 2017-03-24 DIAGNOSIS — K219 Gastro-esophageal reflux disease without esophagitis: Secondary | ICD-10-CM | POA: Diagnosis present

## 2017-03-24 DIAGNOSIS — J9690 Respiratory failure, unspecified, unspecified whether with hypoxia or hypercapnia: Secondary | ICD-10-CM

## 2017-03-24 DIAGNOSIS — E162 Hypoglycemia, unspecified: Secondary | ICD-10-CM | POA: Diagnosis present

## 2017-03-24 DIAGNOSIS — G4733 Obstructive sleep apnea (adult) (pediatric): Secondary | ICD-10-CM | POA: Diagnosis present

## 2017-03-24 DIAGNOSIS — Z794 Long term (current) use of insulin: Secondary | ICD-10-CM

## 2017-03-24 DIAGNOSIS — R4182 Altered mental status, unspecified: Secondary | ICD-10-CM

## 2017-03-24 DIAGNOSIS — Z79899 Other long term (current) drug therapy: Secondary | ICD-10-CM

## 2017-03-24 DIAGNOSIS — R011 Cardiac murmur, unspecified: Secondary | ICD-10-CM | POA: Diagnosis present

## 2017-03-24 DIAGNOSIS — F79 Unspecified intellectual disabilities: Secondary | ICD-10-CM | POA: Diagnosis present

## 2017-03-24 DIAGNOSIS — D696 Thrombocytopenia, unspecified: Secondary | ICD-10-CM | POA: Diagnosis present

## 2017-03-24 DIAGNOSIS — Z87891 Personal history of nicotine dependence: Secondary | ICD-10-CM

## 2017-03-24 DIAGNOSIS — R296 Repeated falls: Secondary | ICD-10-CM | POA: Diagnosis present

## 2017-03-24 DIAGNOSIS — Z452 Encounter for adjustment and management of vascular access device: Secondary | ICD-10-CM

## 2017-03-24 DIAGNOSIS — R0902 Hypoxemia: Secondary | ICD-10-CM | POA: Diagnosis present

## 2017-03-24 DIAGNOSIS — E876 Hypokalemia: Secondary | ICD-10-CM | POA: Diagnosis present

## 2017-03-24 DIAGNOSIS — J45909 Unspecified asthma, uncomplicated: Secondary | ICD-10-CM | POA: Diagnosis present

## 2017-03-24 DIAGNOSIS — F250 Schizoaffective disorder, bipolar type: Secondary | ICD-10-CM | POA: Diagnosis not present

## 2017-03-24 DIAGNOSIS — E119 Type 2 diabetes mellitus without complications: Secondary | ICD-10-CM | POA: Diagnosis present

## 2017-03-24 DIAGNOSIS — R109 Unspecified abdominal pain: Secondary | ICD-10-CM | POA: Diagnosis not present

## 2017-03-24 DIAGNOSIS — R112 Nausea with vomiting, unspecified: Secondary | ICD-10-CM | POA: Diagnosis not present

## 2017-03-24 DIAGNOSIS — E870 Hyperosmolality and hypernatremia: Secondary | ICD-10-CM | POA: Diagnosis present

## 2017-03-24 DIAGNOSIS — G9341 Metabolic encephalopathy: Principal | ICD-10-CM | POA: Diagnosis present

## 2017-03-24 DIAGNOSIS — R680 Hypothermia, not associated with low environmental temperature: Secondary | ICD-10-CM | POA: Diagnosis present

## 2017-03-24 DIAGNOSIS — K5900 Constipation, unspecified: Secondary | ICD-10-CM | POA: Diagnosis present

## 2017-03-24 DIAGNOSIS — Z5329 Procedure and treatment not carried out because of patient's decision for other reasons: Secondary | ICD-10-CM | POA: Diagnosis present

## 2017-03-24 DIAGNOSIS — Z6841 Body Mass Index (BMI) 40.0 and over, adult: Secondary | ICD-10-CM

## 2017-03-24 LAB — URINE DRUG SCREEN, QUALITATIVE (ARMC ONLY)
Amphetamines, Ur Screen: NOT DETECTED
Barbiturates, Ur Screen: NOT DETECTED
Benzodiazepine, Ur Scrn: NOT DETECTED
Cannabinoid 50 Ng, Ur ~~LOC~~: NOT DETECTED
Cocaine Metabolite,Ur ~~LOC~~: NOT DETECTED
MDMA (Ecstasy)Ur Screen: NOT DETECTED
Methadone Scn, Ur: NOT DETECTED
Opiate, Ur Screen: NOT DETECTED
Phencyclidine (PCP) Ur S: NOT DETECTED
Tricyclic, Ur Screen: NOT DETECTED

## 2017-03-24 LAB — URINALYSIS, COMPLETE (UACMP) WITH MICROSCOPIC
Bacteria, UA: NONE SEEN
Bilirubin Urine: NEGATIVE
Glucose, UA: NEGATIVE mg/dL
Hgb urine dipstick: NEGATIVE
Ketones, ur: 5 mg/dL — AB
Leukocytes, UA: NEGATIVE
Nitrite: NEGATIVE
Protein, ur: 100 mg/dL — AB
Specific Gravity, Urine: 1.024 (ref 1.005–1.030)
Squamous Epithelial / HPF: NONE SEEN
pH: 5 (ref 5.0–8.0)

## 2017-03-24 LAB — COMPREHENSIVE METABOLIC PANEL
ALBUMIN: 3.5 g/dL (ref 3.5–5.0)
ALK PHOS: 64 U/L (ref 38–126)
ALT: 37 U/L (ref 14–54)
ANION GAP: 9 (ref 5–15)
AST: 60 U/L — AB (ref 15–41)
BUN: 14 mg/dL (ref 6–20)
CALCIUM: 9.8 mg/dL (ref 8.9–10.3)
CO2: 28 mmol/L (ref 22–32)
Chloride: 112 mmol/L — ABNORMAL HIGH (ref 101–111)
Creatinine, Ser: 0.61 mg/dL (ref 0.44–1.00)
GFR calc Af Amer: >60 mL/min (ref >60)
GFR calc non Af Amer: >60 mL/min (ref >60)
GLUCOSE: 77 mg/dL (ref 65–99)
Potassium: 3.8 mmol/L (ref 3.5–5.1)
SODIUM: 149 mmol/L — AB (ref 135–145)
Total Bilirubin: 0.6 mg/dL (ref 0.3–1.2)
Total Protein: 7.1 g/dL (ref 6.5–8.1)

## 2017-03-24 LAB — SALICYLATE LEVEL: Salicylate Lvl: <7 mg/dL (ref 2.8–30.0)

## 2017-03-24 LAB — BLOOD GAS, VENOUS
Acid-Base Excess: 6.2 mmol/L — ABNORMAL HIGH (ref 0.0–2.0)
Bicarbonate: 34.4 mmol/L — ABNORMAL HIGH (ref 20.0–28.0)
O2 Saturation: 87.2 %
Patient temperature: 37
pCO2, Ven: 70 mmHg — ABNORMAL HIGH (ref 44.0–60.0)
pH, Ven: 7.3 (ref 7.250–7.430)
pO2, Ven: 59 mmHg — ABNORMAL HIGH (ref 32.0–45.0)

## 2017-03-24 LAB — TROPONIN I: Troponin I: <0.03 ng/mL (ref <0.03)

## 2017-03-24 LAB — CBC
HEMATOCRIT: 33.5 % — AB (ref 35.0–47.0)
Hemoglobin: 10.7 g/dL — ABNORMAL LOW (ref 12.0–16.0)
MCH: 28.8 pg (ref 26.0–34.0)
MCHC: 31.8 g/dL — AB (ref 32.0–36.0)
MCV: 90.7 fL (ref 80.0–100.0)
Platelets: 98 10*3/uL — ABNORMAL LOW (ref 150–440)
RBC: 3.7 MIL/uL — ABNORMAL LOW (ref 3.80–5.20)
RDW: 16.3 % — AB (ref 11.5–14.5)
WBC: 4.2 10*3/uL (ref 3.6–11.0)

## 2017-03-24 LAB — GLUCOSE, CAPILLARY: GLUCOSE-CAPILLARY: 72 mg/dL (ref 65–99)

## 2017-03-24 LAB — ACETAMINOPHEN LEVEL

## 2017-03-24 LAB — AMMONIA: Ammonia: 19 umol/L (ref 9–35)

## 2017-03-24 LAB — ETHANOL: Alcohol, Ethyl (B): <10 mg/dL (ref <10)

## 2017-03-24 MED ORDER — ONDANSETRON HCL 4 MG/2ML IJ SOLN
4.0000 mg | Freq: Once | INTRAMUSCULAR | Status: AC
  Administered 2017-03-24: 4 mg via INTRAVENOUS
  Filled 2017-03-24: qty 2

## 2017-03-24 MED ORDER — RISPERIDONE 2 MG PO TABS
2.0000 mg | ORAL_TABLET | Freq: Two times a day (BID) | ORAL | Status: DC
  Administered 2017-03-24 – 2017-03-26 (×4): 2 mg via ORAL
  Filled 2017-03-24 (×2): qty 2
  Filled 2017-03-24: qty 1
  Filled 2017-03-24 (×2): qty 2

## 2017-03-24 MED ORDER — NALOXONE HCL 2 MG/2ML IJ SOSY
2.0000 mg | PREFILLED_SYRINGE | Freq: Once | INTRAMUSCULAR | Status: AC
  Administered 2017-03-24: 2 mg via INTRAVENOUS
  Filled 2017-03-24: qty 2

## 2017-03-24 MED ORDER — BENZTROPINE MESYLATE 1 MG PO TABS
1.0000 mg | ORAL_TABLET | Freq: Two times a day (BID) | ORAL | Status: DC
  Administered 2017-03-24 – 2017-03-26 (×5): 1 mg via ORAL
  Filled 2017-03-24 (×6): qty 1

## 2017-03-24 MED ORDER — DEXTROSE 50 % IV SOLN
12.5000 g | Freq: Once | INTRAVENOUS | Status: AC
  Administered 2017-03-24: 12.5 g via INTRAVENOUS
  Filled 2017-03-24: qty 50

## 2017-03-24 NOTE — ED Provider Notes (Addendum)
-----------------------------------------   6:18 PM on 03/24/2017 -----------------------------------------  At the time that I took over and Dr. Sharma CovertNorman patient was admitted to the medical service for neuro workup, primarily for MRI.  I was informed by Dr. Elisabeth PigeonVachhani that patient was evaluated by neuro, had MRI which was negative, and had no other acute medical issue revealed by her workup - he told me that neuro recs were to have psych eval, and that he consulted Dr. Toni Amendlapacs on the patient.  Dr. Toni Amendlapacs evaluated the patient and stated that it was unclear as to whether her current mental status and behavior were her baseline, but that he did suspect that her symptoms were primarily psych related.  However patient does not meet criteria for inpatient psych eval as she is debilitated and cannot walk.  He ordered her regular psych medications and stated he would follow her to reassess.  On my evaluation, patient is alert and comfortable appearing.  She responds to questions with slurred and minimally comprehensible speech and does not appear fully coherent.  No other focal findings.   At this time, patient has no acute medical issue revealed on workup that would require admission, however she also may have acutely decompensated psychiatrically and is not safe for immediate discharge, although not an imminent danger to self or others.  Therefore we will cancel the admission for now, and keep in ED for psych reassessment and SW and PT eval in the morning.        Dionne BucySiadecki, Ellijah Leffel, MD 03/24/17 [id number]

## 2017-03-24 NOTE — ED Notes (Signed)
Patient transported to X-ray 

## 2017-03-24 NOTE — Progress Notes (Signed)
Dr.Norman had given us for observation for stroke work ups. Pt had MRI done, before I saw- it was negative. So I waited for Psych eval, and Dr. Toni Amendlapacs changed some of her meds, but no need for psych admission. As pt does not have any qualifying diagnosis for medical admission also, spoke to ER physician- he had agreed to monitor pt in ER until tomorrow for her improvement.

## 2017-03-24 NOTE — Consult Note (Signed)
[facility] Face-to-Face Psychiatry Consult   Reason for Consult: Consult for 56 year old woman with a history of chronic mental illness came into the hospital today initially because of falls. Referring Physician: Mariea Clonts Patient Identification: Sarah Hurst MRN:  [id number] Principal Diagnosis: Schizoaffective disorder, bipolar type [facility]) Diagnosis:   Patient Active Problem List   Diagnosis Date Noted  . Schizoaffective disorder, bipolar type ([place]) [F25.0] 03/24/2017    Total Time spent with patient: 1 hour  Subjective:   Sarah Hurst is a 56 y.o. female patient admitted with patient is not able to give any information.  According to the chart she initially presented with a complaint of falls.  She was brought by EMS with a report that she had been having more frequent falls recently.Marland Kitchen  HPI: Chart reviewed.  Attempted to interview patient.  Found 56 year old woman who was awake in bed and in the emergency room.  Patient was very difficult to engage.  Would hold eye contact for only a couple seconds before looking away.  When I spoke with her and ask her questions she answered only intermittently and when she did it was impossible to understand most of what she was saying.  She seemed to try to answer most questions by reading the notice posted on the wall even when it had nothing to do with the questions I was asking.  Seems like her mental status may have actually worsened during the hospital stay since it looks like earlier she was able to give some kind of response to questions.  Patient shook her head no when I asked her if she was having any thoughts of hurting herself or of hurting anyone else.  She has not been aggressive or agitated.  Has been only partially cooperative with workup.  So far patient has had a basic laboratory workup and an MRI of the head.  MRI was normal but really no findings so far that would explain a new mental status change.  Patient is known to be on psychiatric medicine  and is supposedly followed by a local act team.  Unclear if she has been fully compliant with medicine recently.  Social history: Patient has a legal guardian.  She has only been seen here in our area recently which suggests that she is probably originally from another part of the state.  I see a mention in the chart that she was at [facility] at one point.  Possibly then from the [place] area.  Currently living at a group home.  Medical history: Overweight.  Has far as I can tell no specifically known medical problems otherwise.  Substance abuse history: Patient could not answer questions about it.  No evidence of any recent substance abuse and nothing in the chart to suggest past substance abuse  Past Psychiatric History: The patient has been seen in the emergency rooms here and in our system a few times over the last couple months.  It looks like her more typical presentation is to claim that she is having homicidal thoughts towards staff members.  It also looks like she has been difficult to manage at the group home.  There is description of one occasion in which she tried to walk away from a group home while wearing nothing but an adult diaper.  Nonetheless she has not been admitted to the hospital system here for psychiatric treatment recently.  There are mentions of previous treatment at [facility] and at [facility] although those records are not available in the chart.  According to the information I am given she is supposed to be on an [place] injection.  Unclear when that was last administered.  Also looks like she is on some chronic oral antipsychotics.  Unclear if she is ever really tried to hurt her self.  Has made threats towards others in the past.  It looks like the most recent psychiatric evaluations in our system tended to see those as being without much threat to them.  Risk to Self: Is patient at risk for suicide?: No Risk to Others:   Prior Inpatient  Therapy:   Prior Outpatient Therapy:    Past Medical History:  Past Medical History:  Diagnosis Date  . Asthma   . GERD (gastroesophageal reflux disease)   . Mental disability   . Schizoaffective disorder, bipolar type ([place])   . Sleep apnea     Past Surgical History:  Procedure Laterality Date  . WRIST SURGERY     Family History: No family history on file. Family Psychiatric  History: Unknown Social History:  History  Alcohol Use No     History  Drug Use No    Social History   Social History  . Marital status: Single    Spouse name: N/A  . Number of children: N/A  . Years of education: N/A   Social History Main Topics  . Smoking status: Former Research scientist (life sciences)  . Smokeless tobacco: Never Used  . Alcohol use No  . Drug use: No  . Sexual activity: Not Asked   Other Topics Concern  . None   Social History Narrative  . None   Additional Social History:    Allergies:  No Known Allergies  Labs:  Results for orders placed or performed during the hospital encounter of 03/24/17 (from the past 48 hour(s))  Urinalysis, Complete w Microscopic     Status: Abnormal   Collection Time: 03/24/17 12:12 PM  Result Value Ref Range   Color, Urine AMBER (A) YELLOW    Comment: BIOCHEMICALS MAY BE AFFECTED BY COLOR   APPearance HAZY (A) CLEAR   Specific Gravity, Urine 1.024 1.005 - 1.030   pH 5.0 5.0 - 8.0   Glucose, UA NEGATIVE NEGATIVE mg/dL   Hgb urine dipstick NEGATIVE NEGATIVE   Bilirubin Urine NEGATIVE NEGATIVE   Ketones, ur 5 (A) NEGATIVE mg/dL   Protein, ur 100 (A) NEGATIVE mg/dL   Nitrite NEGATIVE NEGATIVE   Leukocytes, UA NEGATIVE NEGATIVE   RBC / HPF 0-5 0 - 5 RBC/hpf   WBC, UA 0-5 0 - 5 WBC/hpf   Bacteria, UA NONE SEEN NONE SEEN   Squamous Epithelial / LPF NONE SEEN NONE SEEN   Mucus PRESENT    Hyaline Casts, UA PRESENT   Urine Drug Screen, Qualitative ([facility] only)     Status: None   Collection Time: 03/24/17 12:12 PM  Result Value Ref Range   Tricyclic, Ur  Screen NONE DETECTED NONE DETECTED   Amphetamines, Ur Screen NONE DETECTED NONE DETECTED   MDMA (Ecstasy)Ur Screen NONE DETECTED NONE DETECTED   Cocaine Metabolite,Ur [place] NONE DETECTED NONE DETECTED   Opiate, Ur Screen NONE DETECTED NONE DETECTED   Phencyclidine (PCP) Ur S NONE DETECTED NONE DETECTED   Cannabinoid 50 Ng, Ur [place] NONE DETECTED NONE DETECTED   Barbiturates, Ur Screen NONE DETECTED NONE DETECTED   Benzodiazepine, Ur Scrn NONE DETECTED NONE DETECTED   Methadone Scn, Ur NONE DETECTED NONE DETECTED    Comment: (NOTE) 867  Tricyclics, urine  Cutoff 1000 ng/mL 200  Amphetamines, urine             Cutoff 1000 ng/mL 300  MDMA (Ecstasy), urine           Cutoff 500 ng/mL 400  Cocaine Metabolite, urine       Cutoff 300 ng/mL 500  Opiate, urine                   Cutoff 300 ng/mL 600  Phencyclidine (PCP), urine      Cutoff 25 ng/mL 700  Cannabinoid, urine              Cutoff 50 ng/mL 800  Barbiturates, urine             Cutoff 200 ng/mL 900  Benzodiazepine, urine           Cutoff 200 ng/mL 1000 Methadone, urine                Cutoff 300 ng/mL 1100 1200 The urine drug screen provides only a preliminary, unconfirmed 1300 analytical test result and should not be used for non-medical 1400 purposes. Clinical consideration and professional judgment should 1500 be applied to any positive drug screen result due to possible 1600 interfering substances. A more specific alternate chemical method 1700 must be used in order to obtain a confirmed analytical result.  1800 Gas chromato graphy / mass spectrometry (GC/MS) is the preferred 1900 confirmatory method.   Blood gas, venous     Status: Abnormal   Collection Time: 03/24/17 12:12 PM  Result Value Ref Range   pH, Ven 7.30 7.250 - 7.430   pCO2, Ven 70 (H) 44.0 - 60.0 mmHg   pO2, Ven 59.0 (H) 32.0 - 45.0 mmHg   Bicarbonate 34.4 (H) 20.0 - 28.0 mmol/L   Acid-Base Excess 6.2 (H) 0.0 - 2.0 mmol/L   O2 Saturation 87.2 %   Patient  temperature 37.0    Collection site VEIN    Sample type VENOUS   Glucose, capillary     Status: None   Collection Time: 03/24/17 12:12 PM  Result Value Ref Range   Glucose-Capillary 72 65 - 99 mg/dL  CBC     Status: Abnormal   Collection Time: 03/24/17 12:58 PM  Result Value Ref Range   WBC 4.2 3.6 - 11.0 K/uL   RBC 3.70 (L) 3.80 - 5.20 MIL/uL   Hemoglobin 10.7 (L) 12.0 - 16.0 g/dL   HCT 33.5 (L) 35.0 - 47.0 %   MCV 90.7 80.0 - 100.0 fL   MCH 28.8 26.0 - 34.0 pg   MCHC 31.8 (L) 32.0 - 36.0 g/dL   RDW 16.3 (H) 11.5 - 14.5 %   Platelets 98 (L) 150 - 440 K/uL  Comprehensive metabolic panel     Status: Abnormal   Collection Time: 03/24/17 12:58 PM  Result Value Ref Range   Sodium 149 (H) 135 - 145 mmol/L   Potassium 3.8 3.5 - 5.1 mmol/L   Chloride 112 (H) 101 - 111 mmol/L   CO2 28 22 - 32 mmol/L   Glucose, Bld 77 65 - 99 mg/dL   BUN 14 6 - 20 mg/dL   Creatinine, Ser 0.61 0.44 - 1.00 mg/dL   Calcium 9.8 8.9 - 10.3 mg/dL   Total Protein 7.1 6.5 - 8.1 g/dL   Albumin 3.5 3.5 - 5.0 g/dL   AST 60 (H) 15 - 41 U/L   ALT 37 14 - 54 U/L   Alkaline Phosphatase 64 38 -  126 U/L   Total Bilirubin 0.6 0.3 - 1.2 mg/dL   GFR calc non Af Amer >60 >60 mL/min   GFR calc Af Amer >60 >60 mL/min    Comment: (NOTE) The eGFR has been calculated using the CKD EPI equation. This calculation has not been validated in all clinical situations. eGFR's persistently <60 mL/min signify possible Chronic Kidney Disease.    Anion gap 9 5 - 15  Troponin I     Status: None   Collection Time: 03/24/17 12:58 PM  Result Value Ref Range   Troponin I <0.03 <0.03 ng/mL  Ethanol     Status: None   Collection Time: 03/24/17 12:58 PM  Result Value Ref Range   Alcohol, Ethyl (B) <10 <10 mg/dL    Comment:        LOWEST DETECTABLE LIMIT FOR SERUM ALCOHOL IS 10 mg/dL FOR MEDICAL PURPOSES ONLY   Salicylate level     Status: None   Collection Time: 03/24/17 12:58 PM  Result Value Ref Range   Salicylate Lvl <5.9  2.8 - 30.0 mg/dL  Acetaminophen level     Status: Abnormal   Collection Time: 03/24/17 12:58 PM  Result Value Ref Range   Acetaminophen (Tylenol), Serum <10 (L) 10 - 30 ug/mL    Comment:        THERAPEUTIC CONCENTRATIONS VARY SIGNIFICANTLY. A RANGE OF 10-30 ug/mL MAY BE AN EFFECTIVE CONCENTRATION FOR MANY PATIENTS. HOWEVER, SOME ARE BEST TREATED AT CONCENTRATIONS OUTSIDE THIS RANGE. ACETAMINOPHEN CONCENTRATIONS >150 ug/mL AT 4 HOURS AFTER INGESTION AND >50 ug/mL AT 12 HOURS AFTER INGESTION ARE OFTEN ASSOCIATED WITH TOXIC REACTIONS.   Ammonia     Status: None   Collection Time: 03/24/17  2:20 PM  Result Value Ref Range   Ammonia 19 9 - 35 umol/L    No current facility-administered medications for this encounter.    Current Outpatient Prescriptions  Medication Sig Dispense Refill  . acetaminophen (TYLENOL) 500 MG tablet Take 1,000 mg by mouth every 4 (four) hours as needed for moderate pain, fever or headache.    . albuterol (PROVENTIL HFA;VENTOLIN HFA) 108 (90 Base) MCG/ACT inhaler Inhale 2 puffs into the lungs every 4 (four) hours as needed for wheezing or shortness of breath.    . benztropine (COGENTIN) 1 MG tablet Take 1 mg by mouth 2 (two) times daily.    . famotidine (PEPCID) 20 MG tablet Take 20 mg by mouth daily.    . insulin lispro (HUMALOG) 100 UNIT/ML injection Inject 0-10 Units into the skin 4 (four) times daily -  before meals and at bedtime. Patient takes per sliding scale; bg 70-149: 0 units, bg 150-200: 2 units, bg 201-250: 4 units, bg 251-300: 6 units, bg 301-350: 8 units, bg 351-400: 10 units, bg 401+: 12 units and contact medical doctor     . lactobacillus acidophilus (BACID) TABS tablet Take 1 tablet by mouth 3 (three) times daily.    Marland Kitchen loperamide (ANTI-DIARRHEAL) 2 MG capsule Take 2 mg by mouth daily as needed for diarrhea or loose stools.    . Multiple Vitamins-Minerals (THERA-M PO) Take 1 tablet by mouth daily.    . ondansetron (ZOFRAN) 4 MG tablet Take 4 mg  by mouth every 6 (six) hours as needed for nausea or vomiting.    . paliperidone (INVEGA SUSTENNA) 156 MG/ML SUSP injection Inject 156 mg into the muscle every 30 (thirty) days.    . risperiDONE (RISPERDAL) 2 MG tablet Take 2 mg by mouth 2 (two) times daily.     Marland Kitchen  zolpidem (AMBIEN) 5 MG tablet Take 5 mg by mouth at bedtime as needed for sleep.      Musculoskeletal: Strength & Muscle Tone: within normal limits Gait & Station: unsteady Patient leans: N/A  Psychiatric Specialty Exam: Physical Exam  Nursing note and vitals reviewed. Constitutional: She appears well-developed and well-nourished.  HENT:  Head: Normocephalic and atraumatic.  Eyes: Pupils are equal, round, and reactive to light. Conjunctivae are normal.  Neck: Normal range of motion.  Cardiovascular: Regular rhythm and normal heart sounds.   Respiratory: Effort normal. No respiratory distress.  GI: Soft.  Musculoskeletal: Normal range of motion.  Neurological: She is alert.  Skin: Skin is warm and dry.  Psychiatric: Her affect is blunt. Her speech is delayed and slurred. She is slowed. Cognition and memory are impaired. She expresses no homicidal and no suicidal ideation. She is noncommunicative.    Review of Systems  Unable to perform ROS: Mental status change    Blood pressure (!) 133/98, pulse (!) 51, temperature 98.2 F (36.8 C), resp. rate 16, height 5' 2"  (1.575 m), weight (!) 317 lb (143.8 kg), SpO2 100 %.Body mass index is 57.98 kg/m.  General Appearance: Casual  Eye Contact:  Minimal  Speech:  Garbled, Slow and Slurred  Volume:  Decreased  Mood:  Negative  Affect:  Negative  Thought Process:  Disorganized and Irrelevant  Orientation:  Negative  Thought Content:  Negative  Suicidal Thoughts:  No  Homicidal Thoughts:  No  Memory:  Negative  Judgement:  Negative  Insight:  Negative  Psychomotor Activity:  Negative  Concentration:  Concentration: Negative  Recall:  Negative  Fund of Knowledge:  Negative   Language:  Negative  Akathisia:  Negative  Handed:  Right  AIMS (if indicated):     Assets:  Financial Resources/Insurance Housing Physical Health Social Support  ADL's:  Impaired  Cognition:  Impaired,  Mild and Moderate  Sleep:        Treatment Plan Summary: Daily contact with patient to assess and evaluate symptoms and progress in treatment, Medication management and Plan 56 year old woman with a history of chronic mental health problems.  Presented with a complaint of falls.  So far nothing in the workup has clearly explain what is going on.  MRI is normal.  Dr. Doy Mince did not think that this was likely to be a stroke if the MRI was normal and recommended psychiatric consultation.  Patient seems possibly delirious although there is no obvious source of an illness to cause delirium.  I would guess this is most likely her schizophrenia manifesting.  Possibly has been noncompliant with medicine.  I will try to order her correct psychiatric medicine as best I can.  At this point the fact that she is not walking and her degree of apparent cognitive impairment would make it difficult for her to be managed on our unit.  I am not clear if she is going to be admitted to the medical service.  In either case I will follow up.  Disposition: No evidence of imminent risk to self or others at present.    Alethia Berthold, MD 03/24/2017 5:53 PM

## 2017-03-24 NOTE — Consult Note (Addendum)
Referring Physician: Sharma Covert    Chief Complaint: Slurred speech, AMS  HPI: Sarah Hurst is an 56 y.o. female presenting with above complaints.  Patient unable to speak and no facility personnel are available therefore all history obtained from the chart.  Patient with a history of mental disability, schizoaffective disorder, bipolar disorder brought from her family care home for recurrent falls.  EMS reports that they have been called out multiple times over the past 2 weeks for recurrent falls and lifting assistance. The patient states she has bilateral leg pain. The patient has been seen in this emergency department multiple times for suicidal ideations in the past. On arrival, the patient has bradycardia, and blood sugar is 72.  Also noted to have significantly slurred speech.  Was given Narcan and D50.  Despite this there was no change int he patient's status.    Date last known well: Unable to determine Time last known well: Unable to determine tPA Given: No: Unable to determine LKW  Past Medical History:  Diagnosis Date  . Asthma   . GERD (gastroesophageal reflux disease)   . Mental disability   . Schizoaffective disorder, bipolar type (HCC)   . Sleep apnea     Past Surgical History:  Procedure Laterality Date  . WRIST SURGERY      Family history: Patient unable to provide  Social History:  reports that she has quit smoking. She has never used smokeless tobacco. She reports that she does not drink alcohol or use drugs.  Allergies: No Known Allergies  Medications: I have reviewed the patient's current medications. Prior to Admission:  Prior to Admission medications   Medication Sig Start Date End Date Taking? Authorizing Provider  acetaminophen (TYLENOL) 500 MG tablet Take 1,000 mg by mouth every 4 (four) hours as needed for moderate pain, fever or headache.   Yes [provider]  albuterol (PROVENTIL HFA;VENTOLIN HFA) 108 (90 Base) MCG/ACT inhaler Inhale 2 puffs  into the lungs every 4 (four) hours as needed for wheezing or shortness of breath.   Yes [provider]  benztropine (COGENTIN) 1 MG tablet Take 1 mg by mouth 2 (two) times daily.   Yes [provider]  famotidine (PEPCID) 20 MG tablet Take 20 mg by mouth daily.   Yes [provider]  insulin lispro (HUMALOG) 100 UNIT/ML injection Inject 0-10 Units into the skin 4 (four) times daily -  before meals and at bedtime. Patient takes per sliding scale; bg 70-149: 0 units, bg 150-200: 2 units, bg 201-250: 4 units, bg 251-300: 6 units, bg 301-350: 8 units, bg 351-400: 10 units, bg 401+: 12 units and contact medical doctor    Yes [provider]  lactobacillus acidophilus (BACID) TABS tablet Take 1 tablet by mouth 3 (three) times daily.   Yes [provider]  loperamide (ANTI-DIARRHEAL) 2 MG capsule Take 2 mg by mouth daily as needed for diarrhea or loose stools.   Yes [provider]  Multiple Vitamins-Minerals (THERA-M PO) Take 1 tablet by mouth daily.   Yes [provider]  ondansetron (ZOFRAN) 4 MG tablet Take 4 mg by mouth every 6 (six) hours as needed for nausea or vomiting.   Yes [provider]  paliperidone (INVEGA SUSTENNA) 156 MG/ML SUSP injection Inject 156 mg into the muscle every 30 (thirty) days.   Yes [provider]  risperiDONE (RISPERDAL) 2 MG tablet Take 2 mg by mouth 2 (two) times daily.    Yes [provider]  zolpidem (  AMBIEN) 5 MG tablet Take 5 mg by mouth at bedtime as needed for sleep.   Yes [provider]    ROS: Patient unable to provide  Physical Examination: Blood pressure (!) 133/98, pulse (!) 51, temperature 98.2 F (36.8 C), temperature source Oral, resp. rate 16, height 5\' 2"  (1.575 m), weight (!) 143.8 kg (317 lb), SpO2 100 %.  HEENT-  Normocephalic, no lesions, without obvious abnormality.  Normal external eye and conjunctiva.  Normal TM's bilaterally.  Normal auditory  canals and external ears. Normal external nose, mucus membranes and septum.  Normal pharynx. Cardiovascular- S1, S2 normal, pulses palpable throughout   Lungs- chest clear, no wheezing, rales, normal symmetric air entry Abdomen- soft, non-tender; bowel sounds normal; no masses,  no organomegaly Extremities- no edema Lymph-no adenopathy palpable Musculoskeletal-no joint tenderness, deformity or swelling Skin-multiple erythematous areas on legs and areas of bruising  Neurological Examination   Mental Status: Alert.  Speech so garbled that patient unable to be understood.  Follows some simple commands but will not nod head in response to questioning.   Cranial Nerves: II: Discs flat bilaterally; Blinks to bilateral confrontation, pupils equal, round, reactive to light and accommodation III,IV, VI: ptosis not present, extra-ocular motions intact bilaterally V,VII: smile symmetric, facial light touch sensation normal bilaterally VIII: hearing normal bilaterally IX,X: gag reflex present XI: unable to test XII: unable to test Motor: Moves all extremities against gravity.  No focal weakness noted. Sensory: Responds to light stimuli in all extremities Deep Tendon Reflexes: 2+ and symmetric with absent AJ's bilaterally Plantars: Right: downgoing   Left: downgoing Cerebellar: Unable to perform secondary to ability to follow commands Gait: not tested due to safety concerns   Laboratory Studies:  Basic Metabolic Panel:  Recent Labs Lab 03/24/17 1258  NA 149*  K 3.8  CL 112*  CO2 28  GLUCOSE 77  BUN 14  CREATININE 0.61  CALCIUM 9.8    Liver Function Tests:  Recent Labs Lab 03/24/17 1258  AST 60*  ALT 37  ALKPHOS 64  BILITOT 0.6  PROT 7.1  ALBUMIN 3.5   No results for input(s): LIPASE, AMYLASE in the last 168 hours. No results for input(s): AMMONIA in the last 168 hours.  CBC:  Recent Labs Lab 03/24/17 1258  WBC 4.2  HGB 10.7*  HCT 33.5*  MCV 90.7  PLT 98*     Cardiac Enzymes:  Recent Labs Lab 03/24/17 1258  TROPONINI <0.03    BNP: Invalid input(s): POCBNP  CBG:  Recent Labs Lab 03/24/17 1212  GLUCAP 72    Microbiology: Results for orders placed or performed during the hospital encounter of 12/26/16  Urine culture     Status: Abnormal   Collection Time: 12/26/16  5:18 PM  Result Value Ref Range Status   Specimen Description URINE, CLEAN CATCH  Final   Special Requests NONE  Final   Culture MULTIPLE SPECIES PRESENT, SUGGEST RECOLLECTION (A)  Final   Report Status 12/28/2016 FINAL  Final    Coagulation Studies: No results for input(s): LABPROT, INR in the last 72 hours.  Urinalysis:  Recent Labs Lab 03/24/17 1212  COLORURINE AMBER*  LABSPEC 1.024  PHURINE 5.0  GLUCOSEU NEGATIVE  HGBUR NEGATIVE  BILIRUBINUR NEGATIVE  KETONESUR 5*  PROTEINUR 100*  NITRITE NEGATIVE  LEUKOCYTESUR NEGATIVE    Lipid Panel: No results found for: CHOL, TRIG, HDL, CHOLHDL, VLDL, LDLCALC  HgbA1C: No results found for: HGBA1C  Urine Drug Screen:     Component Value Date/Time  LABOPIA NONE DETECTED 03/24/2017 1212   LABOPIA NONE DETECTED 01/24/2017 1630   COCAINSCRNUR NONE DETECTED 03/24/2017 1212   LABBENZ NONE DETECTED 03/24/2017 1212   LABBENZ NONE DETECTED 01/24/2017 1630   AMPHETMU NONE DETECTED 03/24/2017 1212   AMPHETMU NONE DETECTED 01/24/2017 1630   THCU NONE DETECTED 03/24/2017 1212   THCU NONE DETECTED 01/24/2017 1630   LABBARB NONE DETECTED 03/24/2017 1212   LABBARB NONE DETECTED 01/24/2017 1630    Alcohol Level:  Recent Labs Lab 03/24/17 1258  ETH <10    Other results: EKG: sinus rhythm at 51 bpm.  Imaging: Dg Chest 2 View  Result Date: 03/24/2017 CLINICAL DATA:  Several recent falls EXAM: CHEST  2 VIEW COMPARISON:  November 25, 2016 FINDINGS: There is atelectatic change in the right base. Lungs elsewhere are clear. Heart is upper normal in size with pulmonary vascularity within normal limits. No  pneumothorax. No bone lesions. IMPRESSION: Right lower lobe atelectatic change. No edema or consolidation. Stable cardiac silhouette. No pneumothorax. Electronically Signed   By: Bretta BangWilliam  Woodruff III M.D.   On: 03/24/2017 12:39   Dg Pelvis 1-2 Views  Result Date: 03/24/2017 CLINICAL DATA:  Pain following recent falls EXAM: PELVIS - 1-2 VIEW COMPARISON:  None. FINDINGS: There is no evidence of pelvic fracture or dislocation. There is slight symmetric narrowing of both hip joints. Sacroiliac joints appear symmetric bilaterally. IMPRESSION: No acute fracture or dislocation. Mild symmetric narrowing of both hip joints. Electronically Signed   By: Bretta BangWilliam  Woodruff III M.D.   On: 03/24/2017 12:40   Ct Head Wo Contrast  Result Date: 03/24/2017 CLINICAL DATA:  56 year old female with frequent falls over the past 2 weeks. Reason episode of altered mental status. Now baseline. Initial encounter. EXAM: CT HEAD WITHOUT CONTRAST TECHNIQUE: Contiguous axial images were obtained from the base of the skull through the vertex without intravenous contrast. COMPARISON:  11/25/2016 FINDINGS: Brain: Exam motion degraded. Images repeated. No obvious intracranial hemorrhage or CT evidence of large acute infarct. No intracranial mass lesion noted on this unenhanced exam. Vascular: No hyperdense vessel. Skull: No skull fracture. Hyperostosis frontalis interna incidentally noted. Sinuses/Orbits: Exophthalmos without acute orbital abnormality. Visualized paranasal sinuses are clear. Other: Minimal partial opacification right mastoid air cells. IMPRESSION: Motion degraded examination without obvious intracranial hemorrhage or acute infarct. No skull fracture. Minimal partial opacification right mastoid air cells. Electronically Signed   By: Lacy DuverneySteven  Olson M.D.   On: 03/24/2017 12:57    Assessment: 56 y.o. female presenting with slurred speech and confusion.  Patient with a strong psychiatric history and some suggestion that this  may be some attention seeking behavior but this is unclear.  Head CT reviewed and shows no acute changes.  Further work up recommended.  Patient without vascular risk factors.    Stroke Risk Factors - none  Plan: 1. MRI of the brain without contrast.  If no acute changes noted, stroke work up not indicated and patient may benefit from a psych evaluation.   2. Prophylactic therapy-Antiplatelet med: Aspirin - dose 325mg  daily 3. NPO until RN stroke swallow screen 4. Telemetry monitoring 5. Frequent neuro checks  Case discussed with Dr. Ilda FoilNorman  Andrzej Scully, MD Neurology [phone] 03/24/2017, 2:34 PM

## 2017-03-24 NOTE — ED Provider Notes (Addendum)
[facility] Emergency Department Provider Note  ____________________________________________  Time seen: Approximately 12:16 PM  I have reviewed the triage vital signs and the nursing notes.   HISTORY  Chief Complaint Fall  The patient history is limited due to her altered mental status  HPI Sarah Hurst is a 56 y.o. female with a history of mental disability, schizoaffective disorder, bipolar disorder brought from her family care home for recurrent falls. The patient is unable to give any of the history due to her altered mental status. EMS reports that they have been called out multiple times over the past 2 weeks for recurrent falls and lifting assistance. The patient is unable to give any additional history; she states she has bilateral leg pain. The patient has been seen in this emergency department multiple times for suicidal ideations in the past. On arrival, the patient has bradycardia, and blood sugar is 72.   Past Medical History:  Diagnosis Date  . Asthma   . GERD (gastroesophageal reflux disease)   . Mental disability   . Schizoaffective disorder, bipolar type (HCC)   . Sleep apnea     There are no active problems to display for this patient.   Past Surgical History:  Procedure Laterality Date  . WRIST SURGERY      Current Outpatient Rx  . Order #: [id number] Class: Historical Med  . Order #: [id number] Class: Historical Med  . Order #: [id number] Class: Historical Med  . Order #: [id number] Class: Historical Med  . Order #: [id number] Class: Historical Med  . Order #: [id number] Class: Historical Med  . Order #: [id number] Class: Historical Med  . Order #: [id number] Class: Historical Med  . Order #: [id number] Class: Historical Med  . Order #: [id number] Class: Historical Med  . Order #: [id number] Class: Historical Med  . Order #: [id number] Class: Historical Med    Allergies Patient has no known allergies.  No family history on file.  Social  History Social History  Substance Use Topics  . Smoking status: Former Games developer  . Smokeless tobacco: Never Used  . Alcohol use No    Review of Systems Unable to obtain due to altered mental status. ____________________________________________   PHYSICAL EXAM:  VITAL SIGNS: ED Triage Vitals  Enc Vitals Group     BP 03/24/17 1207 (!) 133/98     Pulse Rate 03/24/17 1207 (!) 51     Resp 03/24/17 1207 16     Temp 03/24/17 1207 98.2 F (36.8 C)     Temp Source 03/24/17 1207 Oral     SpO2 03/24/17 1207 100 %     Weight 03/24/17 1210 (!) 317 lb (143.8 kg)     Height 03/24/17 1210 5\' 2"  (1.575 m)     Head Circumference --      Peak Flow --      Pain Score 03/24/17 1207 0     Pain Loc --      Pain Edu? --      Excl. in GC? --     Constitutional: the patient is alert and knows her name, but cannot name the hospital or the year. She is protecting her airway, but has mumbled speech. Eyes: Conjunctivae are normal.  EOMI.PERRLA. No scleral icterus.no raccoon eyes. Head: Atraumatic.no Battle sign. Nose: No congestion/rhinnorhea.no swelling over the nose, no septal hematoma. Mouth/Throat: Mucous membranes are mildlydry. No malocclusion or dental injury. Neck: No stridor.  Supple.  No meningismus. Cardiovascular: slowrate, regular rhythm. No murmurs, rubs or gallops.  Respiratory: Normal respiratory effort.  No accessory  muscle use or retractions. Lungs CTAB.  No wheezes, rales or ronchi. Gastrointestinal: morbidly obese.Soft, nontender and nondistended.  No guarding or rebound.  No peritoneal signs. Musculoskeletal: the patient has decreased muscle mass and chronic contractures of the lower extremities bilaterally. She has nonspecific pain with movement of the legs. No pain over the greater trochanters. No knee effusion. No ttp in the calves or palpable cords.  Negative Homan's sign. Neurologic:  The patient is alert but not oriented. She has mumbled speech. Face is symmetric. EOMI.  PERRLA.Moves all extremities spontaneously. Skin:  Skin is warm, dry and intact. No rash noted. Psychiatric: unable to assess due to altered mental status.  ____________________________________________   LABS (all labs ordered are listed, but only abnormal results are displayed)  Labs Reviewed  CBC - Abnormal; Notable for the following:       Result Value   RBC 3.70 (*)    Hemoglobin 10.7 (*)    HCT 33.5 (*)    MCHC 31.8 (*)    RDW 16.3 (*)    Platelets 98 (*)    All other components within normal limits  COMPREHENSIVE METABOLIC PANEL - Abnormal; Notable for the following:    Sodium 149 (*)    Chloride 112 (*)    AST 60 (*)    All other components within normal limits  URINALYSIS, COMPLETE (UACMP) WITH MICROSCOPIC - Abnormal; Notable for the following:    Color, Urine AMBER (*)    APPearance HAZY (*)    Ketones, ur 5 (*)    Protein, ur 100 (*)    All other components within normal limits  ACETAMINOPHEN LEVEL - Abnormal; Notable for the following:    Acetaminophen (Tylenol), Serum <10 (*)    All other components within normal limits  BLOOD GAS, VENOUS - Abnormal; Notable for the following:    pCO2, Ven 70 (*)    pO2, Ven 59.0 (*)    Bicarbonate 34.4 (*)    Acid-Base Excess 6.2 (*)    All other components within normal limits  TROPONIN I  URINE DRUG SCREEN, QUALITATIVE ([facility] ONLY)  ETHANOL  SALICYLATE LEVEL  GLUCOSE, CAPILLARY  AMMONIA  CBG MONITORING, ED   ____________________________________________  EKG  ED ECG REPORT I, Rockne Menghini, the attending physician, personally viewed and interpreted this ECG.   Date: 03/24/2017  EKG Time: 1340  Rate: 51  Rhythm: sinus bradycardia  Axis: normal  Intervals:prolonged QTc  ST&T Change: No STEMI  ____________________________________________  RADIOLOGY  Dg Chest 2 View  Result Date: 03/24/2017 CLINICAL DATA:  Several recent falls EXAM: CHEST  2 VIEW COMPARISON:  November 25, 2016 FINDINGS: There is  atelectatic change in the right base. Lungs elsewhere are clear. Heart is upper normal in size with pulmonary vascularity within normal limits. No pneumothorax. No bone lesions. IMPRESSION: Right lower lobe atelectatic change. No edema or consolidation. Stable cardiac silhouette. No pneumothorax. Electronically Signed   By: Bretta Bang III M.D.   On: 03/24/2017 12:39   Dg Pelvis 1-2 Views  Result Date: 03/24/2017 CLINICAL DATA:  Pain following recent falls EXAM: PELVIS - 1-2 VIEW COMPARISON:  None. FINDINGS: There is no evidence of pelvic fracture or dislocation. There is slight symmetric narrowing of both hip joints. Sacroiliac joints appear symmetric bilaterally. IMPRESSION: No acute fracture or dislocation. Mild symmetric narrowing of both hip joints. Electronically Signed   By: Bretta Bang III M.D.   On: 03/24/2017 12:40   Ct Head Wo Contrast  Result Date: 03/24/2017 CLINICAL DATA:  56 year old female with frequent falls over the past 2 weeks. Reason episode of altered mental status. Now baseline. Initial encounter. EXAM: CT HEAD WITHOUT CONTRAST TECHNIQUE: Contiguous axial images were obtained from the base of the skull through the vertex without intravenous contrast. COMPARISON:  11/25/2016 FINDINGS: Brain: Exam motion degraded. Images repeated. No obvious intracranial hemorrhage or CT evidence of large acute infarct. No intracranial mass lesion noted on this unenhanced exam. Vascular: No hyperdense vessel. Skull: No skull fracture. Hyperostosis frontalis interna incidentally noted. Sinuses/Orbits: Exophthalmos without acute orbital abnormality. Visualized paranasal sinuses are clear. Other: Minimal partial opacification right mastoid air cells. IMPRESSION: Motion degraded examination without obvious intracranial hemorrhage or acute infarct. No skull fracture. Minimal partial opacification right mastoid air cells. Electronically Signed   By: Lacy Duverney M.D.   On: 03/24/2017 12:57     ____________________________________________   PROCEDURES  Procedure(s) performed: None  Procedures  Critical Care performed: No ____________________________________________   INITIAL IMPRESSION / ASSESSMENT AND PLAN / ED COURSE  Pertinent labs & imaging results that were available during my care of the patient were reviewed by me and considered in my medical decision making (see chart for details).  56 y.o. Female with a history of mental disability, schizoaffective disorder, bipolar disorder, presenting for recurrent falls and altered mental status. The patient is bradycardic on arrival, and has a blood sugar of 72, we'll give her half an amp of D50 to see if her mental status improved. We will also give her Narcan although my suspicion for opioid overdose is very low. There is some history that this altered mental status may be her baseline, but I am awaiting the caregiver from her group home to arrival in order to assess that. The patient will undergo full evaluation including CT, chest x-ray, basic laboratory studies and urinalysis for full evaluation.  ----------------------------------------- 1:31 PM on 03/24/2017 -----------------------------------------  The patient continues to be mildly bradycardic but otherwise afebrile. Her CT head does not show any acute intracranial process, her chest x-ray and pelvis x-rays are negative for any acute process. Her blood gas shows a normal pH with hypercarbia, with a PCO2 of 70. I do not have any priors for comparison. She is not hypoxemic.  No caregiver has come yet, so call over to see if anybody can give me a sense of the patient's baseline.  I spoke with Harriett Sine, the pt's caregiver, Harriett Sine, who states she generally "sits around", but she normally has clear speech, not mumbled, and she knows the date/location.  She has been hallucinating since this weekend.  No other symptoms or injuries.  Per report, she was diagnosed with a TIA this  month in [place], 03/14/17.  Patient hasn't had no significant improvement with dextrose or Narcan.  The pt will require admission.  ----------------------------------------- 2:02 PM on 03/24/2017 -----------------------------------------  The patient's CT did not show any acute intracranial process and an MRI has been ordered; I have spoken with Dr. Thad Ranger, the neurologist on-call for further evaluation for possible CVA. I'm awaiting the results of the patient's urinalysis.  ----------------------------------------- 2:31 PM on 03/24/2017 -----------------------------------------  No evidence of UTI.  I have reviewed the patient's medical chart. ____________________________________________  FINAL CLINICAL IMPRESSION(S) / ED DIAGNOSES  Final diagnoses:  Slurred speech  Altered mental status, unspecified altered mental status type  Recurrent falls while walking  Hypercarbia         NEW MEDICATIONS STARTED DURING THIS VISIT:  New Prescriptions   No medications on file  Rockne MenghiniNorman, Anne-Caroline, MD 03/24/17 1431    Rockne MenghiniNorman, Anne-Caroline, MD 03/24/17 [phone]

## 2017-03-24 NOTE — ED Notes (Signed)
Return from MRI

## 2017-03-24 NOTE — ED Triage Notes (Signed)
Arrives via [facility]  Per EMS, patient has had frequent falls over the past two weeks, requiring "Lifting Assistance".  EMS called earlier today for lifting assistance, then called out again for "Psychiatric issues".  Per EMS patient is to baseline.

## 2017-03-24 NOTE — ED Notes (Signed)
Remains in MRI 

## 2017-03-24 NOTE — ED Notes (Signed)
Patient moved to inpatient bed.

## 2017-03-25 NOTE — Consult Note (Signed)
[facility] Face-to-Face Psychiatry Consult   Reason for Consult: Consult for 56 year old woman with a history of chronic mental illness came into the hospital today initially because of falls. Referring Physician: Mariea Clonts Patient Identification: Sarah Hurst MRN:  [id number] Principal Diagnosis: Schizoaffective disorder, bipolar type [facility]) Diagnosis:   Patient Active Problem List   Diagnosis Date Noted  . Schizoaffective disorder, bipolar type ([place]) [F25.0] 03/24/2017    Total Time spent with patient: 30 minutes  Subjective:   Sarah Hurst is a 56 y.o. female patient admitted with patient is not able to give any information.  According to the chart she initially presented with a complaint of falls.  She was brought by EMS with a report that she had been having more frequent falls recently..  Follow-up note for this 56 year old woman with chronic mental health problems.  Reevaluated today.  Patient has not had any new behavior problems overnight.  Nursing reports that she mostly stays in bed without much communication.  I went to speak to her today.  The patient was awake and made a little bit of eye contact but did not engage in any conversation.  When I told her that I thought we would try to have her go back to her group home she did become a little more animated and told me that she wanted to go back home.  Patient has not expressed any violence or threats towards self or anyone else.  Has been cooperative with treatment.  Overall medical opinion seems to be that there is no specific medical need for hospital treatment.  HPI: Chart reviewed.  Attempted to interview patient.  Found 56 year old woman who was awake in bed and in the emergency room.  Patient was very difficult to engage.  Would hold eye contact for only a couple seconds before looking away.  When I spoke with her and ask her questions she answered only intermittently and when she did it was impossible to understand most of what she was  saying.  She seemed to try to answer most questions by reading the notice posted on the wall even when it had nothing to do with the questions I was asking.  Seems like her mental status may have actually worsened during the hospital stay since it looks like earlier she was able to give some kind of response to questions.  Patient shook her head no when I asked her if she was having any thoughts of hurting herself or of hurting anyone else.  She has not been aggressive or agitated.  Has been only partially cooperative with workup.  So far patient has had a basic laboratory workup and an MRI of the head.  MRI was normal but really no findings so far that would explain a new mental status change.  Patient is known to be on psychiatric medicine and is supposedly followed by a local act team.  Unclear if she has been fully compliant with medicine recently.  Social history: Patient has a legal guardian.  She has only been seen here in our area recently which suggests that she is probably originally from another part of the state.  I see a mention in the chart that she was at [facility] at one point.  Possibly then from the [place] area.  Currently living at a group home.  Medical history: Overweight.  Has far as I can tell no specifically known medical problems otherwise.  Substance abuse history: Patient could not answer questions about it.  No evidence of  any recent substance abuse and nothing in the chart to suggest past substance abuse  Past Psychiatric History: The patient has been seen in the emergency rooms here and in our system a few times over the last couple months.  It looks like her more typical presentation is to claim that she is having homicidal thoughts towards staff members.  It also looks like she has been difficult to manage at the group home.  There is description of one occasion in which she tried to walk away from a group home while wearing nothing but an adult diaper.   Nonetheless she has not been admitted to the hospital system here for psychiatric treatment recently.  There are mentions of previous treatment at [facility] and at [facility] although those records are not available in the chart.  According to the information I am given she is supposed to be on an [place] injection.  Unclear when that was last administered.  Also looks like she is on some chronic oral antipsychotics.  Unclear if she is ever really tried to hurt her self.  Has made threats towards others in the past.  It looks like the most recent psychiatric evaluations in our system tended to see those as being without much threat to them.  Risk to Self: Is patient at risk for suicide?: No Risk to Others:   Prior Inpatient Therapy:   Prior Outpatient Therapy:    Past Medical History:  Past Medical History:  Diagnosis Date  . Asthma   . GERD (gastroesophageal reflux disease)   . Mental disability   . Schizoaffective disorder, bipolar type ([place])   . Sleep apnea     Past Surgical History:  Procedure Laterality Date  . WRIST SURGERY     Family History: No family history on file. Family Psychiatric  History: Unknown Social History:  History  Alcohol Use No     History  Drug Use No    Social History   Social History  . Marital status: Single    Spouse name: N/A  . Number of children: N/A  . Years of education: N/A   Social History Main Topics  . Smoking status: Former Research scientist (life sciences)  . Smokeless tobacco: Never Used  . Alcohol use No  . Drug use: No  . Sexual activity: Not Asked   Other Topics Concern  . None   Social History Narrative  . None   Additional Social History:    Allergies:  No Known Allergies  Labs:  Results for orders placed or performed during the hospital encounter of 03/24/17 (from the past 48 hour(s))  Urinalysis, Complete w Microscopic     Status: Abnormal   Collection Time: 03/24/17 12:12 PM  Result Value Ref Range   Color, Urine  AMBER (A) YELLOW    Comment: BIOCHEMICALS MAY BE AFFECTED BY COLOR   APPearance HAZY (A) CLEAR   Specific Gravity, Urine 1.024 1.005 - 1.030   pH 5.0 5.0 - 8.0   Glucose, UA NEGATIVE NEGATIVE mg/dL   Hgb urine dipstick NEGATIVE NEGATIVE   Bilirubin Urine NEGATIVE NEGATIVE   Ketones, ur 5 (A) NEGATIVE mg/dL   Protein, ur 100 (A) NEGATIVE mg/dL   Nitrite NEGATIVE NEGATIVE   Leukocytes, UA NEGATIVE NEGATIVE   RBC / HPF 0-5 0 - 5 RBC/hpf   WBC, UA 0-5 0 - 5 WBC/hpf   Bacteria, UA NONE SEEN NONE SEEN   Squamous Epithelial / LPF NONE SEEN NONE SEEN   Mucus PRESENT  Hyaline Casts, UA PRESENT   Urine Drug Screen, Qualitative ([facility] only)     Status: None   Collection Time: 03/24/17 12:12 PM  Result Value Ref Range   Tricyclic, Ur Screen NONE DETECTED NONE DETECTED   Amphetamines, Ur Screen NONE DETECTED NONE DETECTED   MDMA (Ecstasy)Ur Screen NONE DETECTED NONE DETECTED   Cocaine Metabolite,Ur [place] NONE DETECTED NONE DETECTED   Opiate, Ur Screen NONE DETECTED NONE DETECTED   Phencyclidine (PCP) Ur S NONE DETECTED NONE DETECTED   Cannabinoid 50 Ng, Ur [place] NONE DETECTED NONE DETECTED   Barbiturates, Ur Screen NONE DETECTED NONE DETECTED   Benzodiazepine, Ur Scrn NONE DETECTED NONE DETECTED   Methadone Scn, Ur NONE DETECTED NONE DETECTED    Comment: (NOTE) 132  Tricyclics, urine               Cutoff 1000 ng/mL 200  Amphetamines, urine             Cutoff 1000 ng/mL 300  MDMA (Ecstasy), urine           Cutoff 500 ng/mL 400  Cocaine Metabolite, urine       Cutoff 300 ng/mL 500  Opiate, urine                   Cutoff 300 ng/mL 600  Phencyclidine (PCP), urine      Cutoff 25 ng/mL 700  Cannabinoid, urine              Cutoff 50 ng/mL 800  Barbiturates, urine             Cutoff 200 ng/mL 900  Benzodiazepine, urine           Cutoff 200 ng/mL 1000 Methadone, urine                Cutoff 300 ng/mL 1100 1200 The urine drug screen provides only a preliminary, unconfirmed 1300 analytical test result  and should not be used for non-medical 1400 purposes. Clinical consideration and professional judgment should 1500 be applied to any positive drug screen result due to possible 1600 interfering substances. A more specific alternate chemical method 1700 must be used in order to obtain a confirmed analytical result.  1800 Gas chromato graphy / mass spectrometry (GC/MS) is the preferred 1900 confirmatory method.   Blood gas, venous     Status: Abnormal   Collection Time: 03/24/17 12:12 PM  Result Value Ref Range   pH, Ven 7.30 7.250 - 7.430   pCO2, Ven 70 (H) 44.0 - 60.0 mmHg   pO2, Ven 59.0 (H) 32.0 - 45.0 mmHg   Bicarbonate 34.4 (H) 20.0 - 28.0 mmol/L   Acid-Base Excess 6.2 (H) 0.0 - 2.0 mmol/L   O2 Saturation 87.2 %   Patient temperature 37.0    Collection site VEIN    Sample type VENOUS   Glucose, capillary     Status: None   Collection Time: 03/24/17 12:12 PM  Result Value Ref Range   Glucose-Capillary 72 65 - 99 mg/dL  CBC     Status: Abnormal   Collection Time: 03/24/17 12:58 PM  Result Value Ref Range   WBC 4.2 3.6 - 11.0 K/uL   RBC 3.70 (L) 3.80 - 5.20 MIL/uL   Hemoglobin 10.7 (L) 12.0 - 16.0 g/dL   HCT 33.5 (L) 35.0 - 47.0 %   MCV 90.7 80.0 - 100.0 fL   MCH 28.8 26.0 - 34.0 pg   MCHC 31.8 (L) 32.0 - 36.0 g/dL   RDW  16.3 (H) 11.5 - 14.5 %   Platelets 98 (L) 150 - 440 K/uL  Comprehensive metabolic panel     Status: Abnormal   Collection Time: 03/24/17 12:58 PM  Result Value Ref Range   Sodium 149 (H) 135 - 145 mmol/L   Potassium 3.8 3.5 - 5.1 mmol/L   Chloride 112 (H) 101 - 111 mmol/L   CO2 28 22 - 32 mmol/L   Glucose, Bld 77 65 - 99 mg/dL   BUN 14 6 - 20 mg/dL   Creatinine, Ser 0.61 0.44 - 1.00 mg/dL   Calcium 9.8 8.9 - 10.3 mg/dL   Total Protein 7.1 6.5 - 8.1 g/dL   Albumin 3.5 3.5 - 5.0 g/dL   AST 60 (H) 15 - 41 U/L   ALT 37 14 - 54 U/L   Alkaline Phosphatase 64 38 - 126 U/L   Total Bilirubin 0.6 0.3 - 1.2 mg/dL   GFR calc non Af Amer >60 >60 mL/min    GFR calc Af Amer >60 >60 mL/min    Comment: (NOTE) The eGFR has been calculated using the CKD EPI equation. This calculation has not been validated in all clinical situations. eGFR's persistently <60 mL/min signify possible Chronic Kidney Disease.    Anion gap 9 5 - 15  Troponin I     Status: None   Collection Time: 03/24/17 12:58 PM  Result Value Ref Range   Troponin I <0.03 <0.03 ng/mL  Ethanol     Status: None   Collection Time: 03/24/17 12:58 PM  Result Value Ref Range   Alcohol, Ethyl (B) <10 <10 mg/dL    Comment:        LOWEST DETECTABLE LIMIT FOR SERUM ALCOHOL IS 10 mg/dL FOR MEDICAL PURPOSES ONLY   Salicylate level     Status: None   Collection Time: 03/24/17 12:58 PM  Result Value Ref Range   Salicylate Lvl <3.9 2.8 - 30.0 mg/dL  Acetaminophen level     Status: Abnormal   Collection Time: 03/24/17 12:58 PM  Result Value Ref Range   Acetaminophen (Tylenol), Serum <10 (L) 10 - 30 ug/mL    Comment:        THERAPEUTIC CONCENTRATIONS VARY SIGNIFICANTLY. A RANGE OF 10-30 ug/mL MAY BE AN EFFECTIVE CONCENTRATION FOR MANY PATIENTS. HOWEVER, SOME ARE BEST TREATED AT CONCENTRATIONS OUTSIDE THIS RANGE. ACETAMINOPHEN CONCENTRATIONS >150 ug/mL AT 4 HOURS AFTER INGESTION AND >50 ug/mL AT 12 HOURS AFTER INGESTION ARE OFTEN ASSOCIATED WITH TOXIC REACTIONS.   Ammonia     Status: None   Collection Time: 03/24/17  2:20 PM  Result Value Ref Range   Ammonia 19 9 - 35 umol/L    Current Facility-Administered Medications  Medication Dose Route Frequency Provider Last Rate Last Dose  . benztropine (COGENTIN) tablet 1 mg  1 mg Oral BID Tanetta Fuhriman, Madie Reno, MD   1 mg at 03/25/17 0925  . risperiDONE (RISPERDAL) tablet 2 mg  2 mg Oral BID Andriy Sherk, Madie Reno, MD   2 mg at 03/25/17 7673   Current Outpatient Prescriptions  Medication Sig Dispense Refill  . acetaminophen (TYLENOL) 500 MG tablet Take 1,000 mg by mouth every 4 (four) hours as needed for moderate pain, fever or headache.    .  albuterol (PROVENTIL HFA;VENTOLIN HFA) 108 (90 Base) MCG/ACT inhaler Inhale 2 puffs into the lungs every 4 (four) hours as needed for wheezing or shortness of breath.    . benztropine (COGENTIN) 1 MG tablet Take 1 mg by mouth 2 (two) times daily.    Marland Kitchen  famotidine (PEPCID) 20 MG tablet Take 20 mg by mouth daily.    . insulin lispro (HUMALOG) 100 UNIT/ML injection Inject 0-10 Units into the skin 4 (four) times daily -  before meals and at bedtime. Patient takes per sliding scale; bg 70-149: 0 units, bg 150-200: 2 units, bg 201-250: 4 units, bg 251-300: 6 units, bg 301-350: 8 units, bg 351-400: 10 units, bg 401+: 12 units and contact medical doctor     . lactobacillus acidophilus (BACID) TABS tablet Take 1 tablet by mouth 3 (three) times daily.    Marland Kitchen loperamide (ANTI-DIARRHEAL) 2 MG capsule Take 2 mg by mouth daily as needed for diarrhea or loose stools.    . Multiple Vitamins-Minerals (THERA-M PO) Take 1 tablet by mouth daily.    . ondansetron (ZOFRAN) 4 MG tablet Take 4 mg by mouth every 6 (six) hours as needed for nausea or vomiting.    . paliperidone (INVEGA SUSTENNA) 156 MG/ML SUSP injection Inject 156 mg into the muscle every 30 (thirty) days.    . risperiDONE (RISPERDAL) 2 MG tablet Take 2 mg by mouth 2 (two) times daily.     Marland Kitchen zolpidem (AMBIEN) 5 MG tablet Take 5 mg by mouth at bedtime as needed for sleep.      Musculoskeletal: Strength & Muscle Tone: within normal limits Gait & Station: unsteady Patient leans: N/A  Psychiatric Specialty Exam: Physical Exam  Nursing note and vitals reviewed. Constitutional: She appears well-developed and well-nourished.  HENT:  Head: Normocephalic and atraumatic.  Eyes: Pupils are equal, round, and reactive to light. Conjunctivae are normal.  Neck: Normal range of motion.  Cardiovascular: Regular rhythm and normal heart sounds.   Respiratory: Effort normal. No respiratory distress.  GI: Soft.  Musculoskeletal: Normal range of motion.  Neurological:  She is alert.  Skin: Skin is warm and dry.  Psychiatric: Her affect is blunt. Her speech is delayed and slurred. She is slowed. Cognition and memory are impaired. She expresses no homicidal and no suicidal ideation. She is noncommunicative.    Review of Systems  Unable to perform ROS: Mental status change    Blood pressure (!) 135/98, pulse 75, temperature 98.2 F (36.8 C), resp. rate 18, height 5' 2"  (1.575 m), weight (!) 317 lb (143.8 kg), SpO2 100 %.Body mass index is 57.98 kg/m.  General Appearance: Casual  Eye Contact:  Minimal  Speech:  Garbled, Slow and Slurred  Volume:  Decreased  Mood:  Negative  Affect:  Negative  Thought Process:  Disorganized and Irrelevant  Orientation:  Negative  Thought Content:  Negative  Suicidal Thoughts:  No  Homicidal Thoughts:  No  Memory:  Negative  Judgement:  Negative  Insight:  Negative  Psychomotor Activity:  Negative  Concentration:  Concentration: Negative  Recall:  Negative  Fund of Knowledge:  Negative  Language:  Negative  Akathisia:  Negative  Handed:  Right  AIMS (if indicated):     Assets:  Catering manager Housing Physical Health Social Support  ADL's:  Impaired  Cognition:  Impaired,  Mild and Moderate  Sleep:        Treatment Plan Summary: Daily contact with patient to assess and evaluate symptoms and progress in treatment, Medication management and Plan Patient is a 56 year old woman with a history of chronic mental health problems who judging by the chart has a history of some chaotic behavior issues especially in the context of emergency room visits.  Last night and today she is almost completely mute.  Has her  eyes open and interacts a little bit nonverbally but has a flat affect.  She has eaten and has taken medicine.  There is no indication of specific dangerousness at this point.  Patient does not appear to be fully catatonic.  She would not be appropriate for admission to the psychiatric unit here.   Given the choice between referral to a geropsychiatry hospital and having her go back to her group home I think that it would be better to have her try to go home.  We will see if they are able to manage her given their long familiarity with her.  If things deteriorate they certainly can reassess and have her brought back to the hospital but at this point there does not seem to be any indication of acute dangerousness that would compel [place] to keep her in the hospital.  Case reviewed with the ER physician and TTS.  Also social work.  Disposition: No evidence of imminent risk to self or others at present.    Alethia Berthold, MD 03/25/2017 1:21 PM

## 2017-03-25 NOTE — Evaluation (Signed)
Physical Therapy Evaluation Patient Details Name: Sarah Hurst MRN: [id number] DOB: 17-Mar-1961 Today's Date: 03/25/2017   History of Present Illness  56 y.o. female with a history of mental disability, schizoaffective disorder, bipolar disorder brought from her family care home for recurrent falls.  EMS has been called multiple times in the last 2 weeks for "lifting assist."    Clinical Impression  Pt was very limited with all aspects of PT exam.  She was unable to say anything (except her first name) t/o the effort and had flat affect with very limited ability to meaningfully follow instructions from PT.  She needed max assist to get to sitting, but was able to maintain her balance relatively well once sitting.  She was completely unable to assist with attempt to get to standing.  Pt will require increased level of care as compared to baseline.     Follow Up Recommendations SNF    Equipment Recommendations       Recommendations for Other Services       Precautions / Restrictions Precautions Precautions: Fall Restrictions Weight Bearing Restrictions: No      Mobility  Bed Mobility Overal bed mobility: Needs Assistance Bed Mobility: Supine to Sit;Sit to Supine     Supine to sit: Max assist Sit to supine: Total assist   General bed mobility comments: Pt unable to initiate movement to sitting, did "assist" minimally once PT started the motion - able to maintain balance once assisted to squared up position  Transfers                 General transfer comment: partially attempted X1, however pt was unable to give any assist and ultimately we were unable to even elevate bottom off bed  Ambulation/Gait                Stairs            Wheelchair Mobility    Modified Rankin (Stroke Patients Only)       Balance Overall balance assessment: Needs assistance Sitting-balance support: Bilateral upper extremity supported Sitting balance-Leahy Scale:  Fair Sitting balance - Comments: Once heavily assisted to position at EOB she could keep herself upright with only rare min assist (heavy assist to get to position)     Standing balance-Leahy Scale: Zero                               Pertinent Vitals/Pain Pain Assessment:  (pt does not indicate pain with most acts)    Home Living Family/patient expects to be discharged to:: Skilled nursing facility Living Arrangements: Group Home (assited living, [facility])                    Prior Function           Comments: pt unable to answer, seems likely she was minimally active     Hand Dominance        Extremity/Trunk Assessment   Upper Extremity Assessment Upper Extremity Assessment: Difficult to assess due to impaired cognition (appears grossly less than 3/5)    Lower Extremity Assessment Lower Extremity Assessment: Difficult to assess due to impaired cognition (appears grossly less than 3/5)       Communication   Communication: Expressive difficulties;Receptive difficulties  Cognition   Behavior During Therapy: Flat affect Overall Cognitive Status: Difficult to assess  General Comments: Pt struggled to say just her first name with intensive cuing.  Pt did not say another single word t/o the session      General Comments      Exercises     Assessment/Plan    PT Assessment Patient needs continued PT services  PT Problem List Decreased strength;Decreased range of motion;Decreased activity tolerance;Decreased mobility;Decreased balance;Decreased coordination;Decreased cognition;Decreased knowledge of use of DME;Decreased safety awareness       PT Treatment Interventions DME instruction;Gait training;Functional mobility training;Therapeutic activities;Therapeutic exercise;Balance training;Neuromuscular re-education;Cognitive remediation;Patient/family education    PT Goals (Current goals can be  found in the Care Plan section)  Acute Rehab PT Goals Patient Stated Goal: unable to state PT Goal Formulation: Patient unable to participate in goal setting Time For Goal Achievement: 04/08/17 Potential to Achieve Goals: Poor    Frequency Min 2X/week   Barriers to discharge        Co-evaluation               AM-PAC PT "6 Clicks" Daily Activity  Outcome Measure Difficulty turning over in bed (including adjusting bedclothes, sheets and blankets)?: Unable Difficulty moving from lying on back to sitting on the side of the bed? : Unable Difficulty sitting down on and standing up from a chair with arms (e.g., wheelchair, bedside commode, etc,.)?: Unable Help needed moving to and from a bed to chair (including a wheelchair)?: Total Help needed walking in hospital room?: Total Help needed climbing 3-5 steps with a railing? : Total 6 Click Score: 6    End of Session Equipment Utilized During Treatment: Gait belt Activity Tolerance:  (Pt unable to follow instructions, very flat affect t/o eval) Patient left: with bed alarm set;with call bell/phone within reach Nurse Communication: Mobility status PT Visit Diagnosis: Muscle weakness (generalized) (M62.81);Difficulty in walking, not elsewhere classified (R26.2)    Time: 1610-9604 PT Time Calculation (min) (ACUTE ONLY): 19 min   Charges:   PT Evaluation $PT Eval Low Complexity: 1 Low     PT G Codes:   PT G-Codes **NOT FOR INPATIENT CLASS** Functional Assessment Tool Used: AM-PAC 6 Clicks Basic Mobility Functional Limitation: Mobility: Walking and moving around Mobility: Walking and Moving Around Current Status (V4098): 100 percent impaired, limited or restricted Mobility: Walking and Moving Around Goal Status (J1914): At least 60 percent but less than 80 percent impaired, limited or restricted    Malachi Pro, DPT 03/25/2017, 2:00 PM

## 2017-03-25 NOTE — ED Notes (Signed)
Called and spoke with [facility]. Pt feeds herself per staff report.

## 2017-03-25 NOTE — Clinical Social Work Note (Signed)
CSW consulted for "Providers confirm no need for medical admission, [facility] states unable to psych admit. Consult to PT and concern for placement." [facility] and was bought here via EMS due to a fall. CSW evaluated by psych and feels pt can return back to group home, as issue with walking are likely psychogenic. CSW staffed decision with CSW Asst. Director Wandra MannanZack Brooks and he agrees. EDP Dr. Sharma CovertNorman stated she is not agreeable to this plan and wants pt to be placed at SNF. CSW updated AD Zack.     CSW left voicemail with pt's legal Guardian is Hazle CocaVonda Thomas [phone]. [phone]) with [employer]. CSW cannot move forward with pursuing SNF placement until receiving approval from Guardian. CSW awaiting call back from Guardian.   CSW continuing to follow for discharge needs.   Corlis HoveJeneya Cataleyah Colborn, Theresia MajorsLCSWA, [facility] Clinical Social Worker-ED  [phone]

## 2017-03-25 NOTE — ED Notes (Signed)
Patient fed lunch by this RN and tech. Patient encouraged to feed herself and just stares refusing to try. Patients facility called about baseline

## 2017-03-25 NOTE — ED Notes (Signed)
PT at bedside.

## 2017-03-25 NOTE — ED Notes (Signed)
Fed patient breakfast. She refused to feed herself but would eat when this RN fed her

## 2017-03-26 LAB — COMPREHENSIVE METABOLIC PANEL WITH GFR
ALT: 42 U/L (ref 14–54)
AST: 64 U/L — ABNORMAL HIGH (ref 15–41)
Albumin: 2.4 g/dL — ABNORMAL LOW (ref 3.5–5.0)
Alkaline Phosphatase: 44 U/L (ref 38–126)
Anion gap: 7 (ref 5–15)
BUN: 9 mg/dL (ref 6–20)
CO2: 24 mmol/L (ref 22–32)
Calcium: 8 mg/dL — ABNORMAL LOW (ref 8.9–10.3)
Chloride: 117 mmol/L — ABNORMAL HIGH (ref 101–111)
Creatinine, Ser: 0.49 mg/dL (ref 0.44–1.00)
GFR calc Af Amer: >60 mL/min
GFR calc non Af Amer: >60 mL/min
Glucose, Bld: 74 mg/dL (ref 65–99)
Potassium: 3.3 mmol/L — ABNORMAL LOW (ref 3.5–5.1)
Sodium: 148 mmol/L — ABNORMAL HIGH (ref 135–145)
Total Bilirubin: 0.6 mg/dL (ref 0.3–1.2)
Total Protein: 5.2 g/dL — ABNORMAL LOW (ref 6.5–8.1)

## 2017-03-26 LAB — CBC
HEMATOCRIT: 34.2 % — AB (ref 35.0–47.0)
Hemoglobin: 10.7 g/dL — ABNORMAL LOW (ref 12.0–16.0)
MCH: 28.3 pg (ref 26.0–34.0)
MCHC: 31.3 g/dL — ABNORMAL LOW (ref 32.0–36.0)
MCV: 90.4 fL (ref 80.0–100.0)
PLATELETS: 104 10*3/uL — AB (ref 150–440)
RBC: 3.78 MIL/uL — AB (ref 3.80–5.20)
RDW: 16.7 % — AB (ref 11.5–14.5)
WBC: 4 10*3/uL (ref 3.6–11.0)

## 2017-03-26 LAB — GLUCOSE, CAPILLARY
GLUCOSE-CAPILLARY: 84 mg/dL (ref 65–99)
Glucose-Capillary: 38 mg/dL — CL (ref 65–99)
Glucose-Capillary: 72 mg/dL (ref 65–99)
Glucose-Capillary: 77 mg/dL (ref 65–99)
Glucose-Capillary: 58 mg/dL — ABNORMAL LOW (ref 65–99)
Glucose-Capillary: 90 mg/dL (ref 65–99)
Glucose-Capillary: 67 mg/dL (ref 65–99)
Glucose-Capillary: 61 mg/dL — ABNORMAL LOW (ref 65–99)

## 2017-03-26 MED ORDER — DEXTROSE 50 % IV SOLN
INTRAVENOUS | Status: AC
  Filled 2017-03-26: qty 50

## 2017-03-26 MED ORDER — ENSURE ENLIVE PO LIQD
237.0000 mL | Freq: Two times a day (BID) | ORAL | Status: DC
  Administered 2017-03-26: 237 mL via ORAL

## 2017-03-26 MED ORDER — SODIUM CHLORIDE 0.9 % IV BOLUS (SEPSIS)
1000.0000 mL | Freq: Once | INTRAVENOUS | Status: AC
  Administered 2017-03-26: 1000 mL via INTRAVENOUS

## 2017-03-26 MED ORDER — DEXTROSE 50 % IV SOLN
1.0000 | Freq: Once | INTRAVENOUS | Status: AC
  Administered 2017-03-26: 50 mL via INTRAVENOUS

## 2017-03-26 NOTE — [place] FL2 (Signed)
[place] MEDICAID FL2 LEVEL OF CARE SCREENING TOOL     IDENTIFICATION  Patient Name: Sarah Hurst Birthdate: 02-21-1961 Sex: female Admission Date (Current Location): 03/24/2017  [place]  [place] [id number] [facility] Facility and Address:  [facility], [address], [place], [place] [address]      Provider Number: [id number]  Attending Physician Name and Address:  No att. providers found  Relative Name and Phone Number:  Legal Guardian Hazle Coca- Vonda Thomas [phone]. [phone]    Current Level of Care: Hospital Recommended Level of Care: Skilled Nursing Facility Prior Approval Number:    Date Approved/Denied:   PASRR Number:    Discharge Plan: SNF    Current Diagnoses: Patient Active Problem List   Diagnosis Date Noted  . Schizoaffective disorder, bipolar type (HCC) 03/24/2017    Orientation RESPIRATION BLADDER Height & Weight      (Unknown-Not responding to questions)  Normal Incontinent Weight: (!) 317 lb (143.8 kg) Height:  5\' 2"  (157.5 cm)  BEHAVIORAL SYMPTOMS/MOOD NEUROLOGICAL BOWEL NUTRITION STATUS      Incontinent Diet  AMBULATORY STATUS COMMUNICATION OF NEEDS Skin   Extensive Assist Verbally (Responds to name) Normal                       Personal Care Assistance Level of Assistance  Bathing, Feeding, Dressing Bathing Assistance: Maximum assistance Feeding assistance: Maximum assistance Dressing Assistance: Maximum assistance     Functional Limitations Info  Sight, Hearing, Speech Sight Info: Adequate Hearing Info: Adequate Speech Info: Adequate    SPECIAL CARE FACTORS FREQUENCY                       Contractures Contractures Info: Not present    Additional Factors Info  Code Status, Allergies Code Status Info: Full Allergies Info: No known allergies           Current Medications (03/26/2017):  This is the current hospital active medication list Current Facility-Administered  Medications  Medication Dose Route Frequency Provider Last Rate Last Dose  . benztropine (COGENTIN) tablet 1 mg  1 mg Oral BID Clapacs, Jackquline DenmarkJohn T, MD   1 mg at 03/26/17 1044  . feeding supplement (ENSURE ENLIVE) (ENSURE ENLIVE) liquid 237 mL  237 mL Oral BID BM Emily FilbertWilliams, Jonathan E, MD      . risperiDONE (RISPERDAL) tablet 2 mg  2 mg Oral BID Clapacs, Jackquline DenmarkJohn T, MD   2 mg at 03/26/17 1045   Current Outpatient Prescriptions  Medication Sig Dispense Refill  . acetaminophen (TYLENOL) 500 MG tablet Take 1,000 mg by mouth every 4 (four) hours as needed for moderate pain, fever or headache.    . albuterol (PROVENTIL HFA;VENTOLIN HFA) 108 (90 Base) MCG/ACT inhaler Inhale 2 puffs into the lungs every 4 (four) hours as needed for wheezing or shortness of breath.    . benztropine (COGENTIN) 1 MG tablet Take 1 mg by mouth 2 (two) times daily.    . famotidine (PEPCID) 20 MG tablet Take 20 mg by mouth daily.    . insulin lispro (HUMALOG) 100 UNIT/ML injection Inject 0-10 Units into the skin 4 (four) times daily -  before meals and at bedtime. Patient takes per sliding scale; bg 70-149: 0 units, bg 150-200: 2 units, bg 201-250: 4 units, bg 251-300: 6 units, bg 301-350: 8 units, bg 351-400: 10 units, bg 401+: 12 units and contact medical doctor     . lactobacillus acidophilus (BACID) TABS tablet Take  1 tablet by mouth 3 (three) times daily.    Marland Kitchen loperamide (ANTI-DIARRHEAL) 2 MG capsule Take 2 mg by mouth daily as needed for diarrhea or loose stools.    . Multiple Vitamins-Minerals (THERA-M PO) Take 1 tablet by mouth daily.    . ondansetron (ZOFRAN) 4 MG tablet Take 4 mg by mouth every 6 (six) hours as needed for nausea or vomiting.    . paliperidone (INVEGA SUSTENNA) 156 MG/ML SUSP injection Inject 156 mg into the muscle every 30 (thirty) days.    . risperiDONE (RISPERDAL) 2 MG tablet Take 2 mg by mouth 2 (two) times daily.     Marland Kitchen zolpidem (AMBIEN) 5 MG tablet Take 5 mg by mouth at bedtime as needed for sleep.        Discharge Medications: Please see discharge summary for a list of discharge medications.  Relevant Imaging Results:  Relevant Lab Results:   Additional Information SSN: [id number]  Dominic Pea, LCSW

## 2017-03-26 NOTE — ED Notes (Signed)
Pt drank a cup of orange juice and a cup of water

## 2017-03-26 NOTE — ED Notes (Signed)
Pt resting in bed resp even and unlabored, eyes closed

## 2017-03-26 NOTE — BH Assessment (Signed)
Clinician received phone call from pt's [facility] Care Coordinator Regan Rakers(Kim Oneal). Pt is not being followed by TTS. Clinician provided care coordinator with contact information for social work Tonette Bihari(Jeneya).

## 2017-03-26 NOTE — ED Notes (Signed)
Pt was able to drink a total of 8oz orange juice PO over the last 90 minutes.

## 2017-03-26 NOTE — ED Notes (Addendum)
Pt was bathed lines changed heels floated, and a purwick placed. Pt attempted to feed breakfast.

## 2017-03-26 NOTE — ED Notes (Signed)
Upon checking pt BGC it is 38 1 ampule of dextrose admin Rn will continue to monitor EDP is aware.

## 2017-03-26 NOTE — ED Notes (Signed)
Per Dr Lenard LancePaduchowski, 10pm dose of Risperidone (Risperdal) 2mg  to be held on 03/26/2017.

## 2017-03-26 NOTE — ED Notes (Signed)
Pt checked for soiled linen and purwick, bed dry no output seen in suction, pt bladder scanned showed 408. EDP made aware of bladder scan and no output since bed change this am. EDP also made aware of pt blood sugar and decrease po intake today. At this time no new orders weill continue to monitor.

## 2017-03-26 NOTE — ED Notes (Signed)
Dr Lenard LancePaduchowski notified of pt's FSBS of 61; verbal order for PO orange juice to encourage pt to eat/drink.

## 2017-03-26 NOTE — Clinical Social Work Note (Signed)
9:31am-CSW left voicemail with pt's legal Guardian is Fayne Norrie [phone]. [phone]) with [employer]. CSW cannot move forward with pursuing SNF placement until receiving approval from [place]. CSW awaiting call back from Guardian.   10:13am- CSW recevied call from [place] stating she is agreeable to SNF placement for pt where ever available. CSW returned call to [place] [phone]). Ms. Farris Has stated she will staff with her supervisor for further guidance as to how they can assist. Ms. Farris Has also stated she will contact pt's [employer] [facility] coordinator, Lars Masson, to update.   FL-2 completed and sent out. CSW submitted PASRR today and additional information was faxed in as requested. CSW received only 1 offer from [facility] and left a voicemail for [employer], Engineer, site at [phone]. CSW also spoke with April in admissions at [facility] for follow-up. Ms. April states someone from admissions will need to come out to [facility] to assess pt due to her mental health status and will call CSW when coming.   CSW continuing to follow for discharge needs.   Oretha Ellis, Latanya Presser, [place] Social Worker-ED  [phone]

## 2017-03-26 NOTE — ED Notes (Signed)
Pt refusing to eat lunch, CBg checked and 1 amp of d50 given, EDP aware, ensure given to pt and encouraged to drink

## 2017-03-26 NOTE — ED Provider Notes (Signed)
-----------------------------------------   6:05 PM on 03/26/2017 -----------------------------------------  Patient remains fairly somnolent in the emergency department.  Is not eating or drinking much today.  Has had very little urine output.  Bladder scan shows approximately 400 cc of urine.  Patient is somnolent however when I examined the patient she is able to wake up upon voice but is having difficulty answering questions etc.  We will recheck labs including a CBC and a BMP.  We will bolus a liter of fluid and continue to closely monitor.  Patient somnolence could very well be due to the psychiatric medication she was started on those have since been stopped due to somnolence.  Labs are largely normal, mild hypernatremia.  Patient has received 1 L of fluids.  We will once again hold the patient's Risperdal and continue to closely monitor.  Last fingerstick was 84.   Minna AntisPaduchowski, Yadhira Mckneely, MD 03/26/17 [phone]

## 2017-03-27 ENCOUNTER — Inpatient Hospital Stay: Payer: Medicaid Other

## 2017-03-27 ENCOUNTER — Encounter: Payer: Self-pay | Admitting: Internal Medicine

## 2017-03-27 DIAGNOSIS — G9341 Metabolic encephalopathy: Secondary | ICD-10-CM | POA: Diagnosis present

## 2017-03-27 DIAGNOSIS — R4781 Slurred speech: Secondary | ICD-10-CM | POA: Diagnosis present

## 2017-03-27 DIAGNOSIS — E162 Hypoglycemia, unspecified: Secondary | ICD-10-CM

## 2017-03-27 DIAGNOSIS — R6521 Severe sepsis with septic shock: Secondary | ICD-10-CM | POA: Diagnosis not present

## 2017-03-27 DIAGNOSIS — T68XXXA Hypothermia, initial encounter: Secondary | ICD-10-CM | POA: Diagnosis not present

## 2017-03-27 DIAGNOSIS — K219 Gastro-esophageal reflux disease without esophagitis: Secondary | ICD-10-CM | POA: Diagnosis present

## 2017-03-27 DIAGNOSIS — R296 Repeated falls: Secondary | ICD-10-CM | POA: Diagnosis present

## 2017-03-27 DIAGNOSIS — E876 Hypokalemia: Secondary | ICD-10-CM | POA: Diagnosis present

## 2017-03-27 DIAGNOSIS — D696 Thrombocytopenia, unspecified: Secondary | ICD-10-CM | POA: Diagnosis present

## 2017-03-27 DIAGNOSIS — R680 Hypothermia, not associated with low environmental temperature: Secondary | ICD-10-CM | POA: Diagnosis present

## 2017-03-27 DIAGNOSIS — R0689 Other abnormalities of breathing: Secondary | ICD-10-CM | POA: Diagnosis not present

## 2017-03-27 DIAGNOSIS — Z79899 Other long term (current) drug therapy: Secondary | ICD-10-CM | POA: Diagnosis not present

## 2017-03-27 DIAGNOSIS — E119 Type 2 diabetes mellitus without complications: Secondary | ICD-10-CM | POA: Diagnosis present

## 2017-03-27 DIAGNOSIS — R4182 Altered mental status, unspecified: Secondary | ICD-10-CM | POA: Diagnosis not present

## 2017-03-27 DIAGNOSIS — Z6841 Body Mass Index (BMI) 40.0 and over, adult: Secondary | ICD-10-CM | POA: Diagnosis not present

## 2017-03-27 DIAGNOSIS — A419 Sepsis, unspecified organism: Secondary | ICD-10-CM | POA: Diagnosis not present

## 2017-03-27 DIAGNOSIS — J45909 Unspecified asthma, uncomplicated: Secondary | ICD-10-CM | POA: Diagnosis present

## 2017-03-27 DIAGNOSIS — G9340 Encephalopathy, unspecified: Secondary | ICD-10-CM | POA: Diagnosis not present

## 2017-03-27 DIAGNOSIS — Z87891 Personal history of nicotine dependence: Secondary | ICD-10-CM | POA: Diagnosis not present

## 2017-03-27 DIAGNOSIS — I5030 Unspecified diastolic (congestive) heart failure: Secondary | ICD-10-CM | POA: Diagnosis not present

## 2017-03-27 DIAGNOSIS — R579 Shock, unspecified: Secondary | ICD-10-CM | POA: Diagnosis not present

## 2017-03-27 DIAGNOSIS — R001 Bradycardia, unspecified: Secondary | ICD-10-CM | POA: Diagnosis present

## 2017-03-27 DIAGNOSIS — K5900 Constipation, unspecified: Secondary | ICD-10-CM | POA: Diagnosis present

## 2017-03-27 DIAGNOSIS — I959 Hypotension, unspecified: Secondary | ICD-10-CM | POA: Diagnosis present

## 2017-03-27 DIAGNOSIS — R112 Nausea with vomiting, unspecified: Secondary | ICD-10-CM | POA: Diagnosis not present

## 2017-03-27 DIAGNOSIS — E870 Hyperosmolality and hypernatremia: Secondary | ICD-10-CM | POA: Diagnosis present

## 2017-03-27 DIAGNOSIS — Z5329 Procedure and treatment not carried out because of patient's decision for other reasons: Secondary | ICD-10-CM | POA: Diagnosis present

## 2017-03-27 DIAGNOSIS — F250 Schizoaffective disorder, bipolar type: Secondary | ICD-10-CM | POA: Diagnosis present

## 2017-03-27 DIAGNOSIS — F79 Unspecified intellectual disabilities: Secondary | ICD-10-CM | POA: Diagnosis present

## 2017-03-27 DIAGNOSIS — Z794 Long term (current) use of insulin: Secondary | ICD-10-CM | POA: Diagnosis not present

## 2017-03-27 DIAGNOSIS — G4733 Obstructive sleep apnea (adult) (pediatric): Secondary | ICD-10-CM | POA: Diagnosis present

## 2017-03-27 DIAGNOSIS — R0902 Hypoxemia: Secondary | ICD-10-CM | POA: Diagnosis present

## 2017-03-27 LAB — COMPREHENSIVE METABOLIC PANEL
ALBUMIN: 2.6 g/dL — AB (ref 3.5–5.0)
ALT: 47 U/L (ref 14–54)
ANION GAP: 5 (ref 5–15)
AST: 61 U/L — AB (ref 15–41)
Alkaline Phosphatase: 60 U/L (ref 38–126)
BUN: 10 mg/dL (ref 6–20)
CHLORIDE: 113 mmol/L — AB (ref 101–111)
CO2: 29 mmol/L (ref 22–32)
Calcium: 8.9 mg/dL (ref 8.9–10.3)
Creatinine, Ser: 0.61 mg/dL (ref 0.44–1.00)
GFR calc Af Amer: >60 mL/min (ref >60)
GFR calc non Af Amer: >60 mL/min (ref >60)
GLUCOSE: 85 mg/dL (ref 65–99)
POTASSIUM: 3.4 mmol/L — AB (ref 3.5–5.1)
Sodium: 147 mmol/L — ABNORMAL HIGH (ref 135–145)
Total Bilirubin: 0.5 mg/dL (ref 0.3–1.2)
Total Protein: 5.8 g/dL — ABNORMAL LOW (ref 6.5–8.1)

## 2017-03-27 LAB — GLUCOSE, CAPILLARY
GLUCOSE-CAPILLARY: 128 mg/dL — AB (ref 65–99)
GLUCOSE-CAPILLARY: 48 mg/dL — AB (ref 65–99)
GLUCOSE-CAPILLARY: 63 mg/dL — AB (ref 65–99)
GLUCOSE-CAPILLARY: 68 mg/dL (ref 65–99)
GLUCOSE-CAPILLARY: 73 mg/dL (ref 65–99)
GLUCOSE-CAPILLARY: 83 mg/dL (ref 65–99)
GLUCOSE-CAPILLARY: 94 mg/dL (ref 65–99)
GLUCOSE-CAPILLARY: 94 mg/dL (ref 65–99)
Glucose-Capillary: 45 mg/dL — ABNORMAL LOW (ref 65–99)
Glucose-Capillary: 71 mg/dL (ref 65–99)
Glucose-Capillary: 76 mg/dL (ref 65–99)
Glucose-Capillary: 73 mg/dL (ref 65–99)
Glucose-Capillary: 64 mg/dL — ABNORMAL LOW (ref 65–99)
Glucose-Capillary: 81 mg/dL (ref 65–99)
Glucose-Capillary: 38 mg/dL — CL (ref 65–99)
Glucose-Capillary: 72 mg/dL (ref 65–99)
Glucose-Capillary: 133 mg/dL — ABNORMAL HIGH (ref 65–99)
Glucose-Capillary: 50 mg/dL — ABNORMAL LOW (ref 65–99)
Glucose-Capillary: 56 mg/dL — ABNORMAL LOW (ref 65–99)
Glucose-Capillary: 89 mg/dL (ref 65–99)
Glucose-Capillary: 97 mg/dL (ref 65–99)
Glucose-Capillary: 98 mg/dL (ref 65–99)
Glucose-Capillary: 94 mg/dL (ref 65–99)

## 2017-03-27 LAB — TSH: TSH: 3.525 u[IU]/mL (ref 0.350–4.500)

## 2017-03-27 LAB — URINALYSIS, COMPLETE (UACMP) WITH MICROSCOPIC
Bacteria, UA: NONE SEEN
Bilirubin Urine: NEGATIVE
Glucose, UA: NEGATIVE mg/dL
Hgb urine dipstick: NEGATIVE
Ketones, ur: NEGATIVE mg/dL
LEUKOCYTES UA: NEGATIVE
Nitrite: NEGATIVE
Protein, ur: 30 mg/dL — AB
SPECIFIC GRAVITY, URINE: 1.019 (ref 1.005–1.030)
SQUAMOUS EPITHELIAL / LPF: NONE SEEN
pH: 5 (ref 5.0–8.0)

## 2017-03-27 LAB — CBC
HCT: 35.6 % (ref 35.0–47.0)
Hemoglobin: 11.3 g/dL — ABNORMAL LOW (ref 12.0–16.0)
MCH: 28.5 pg (ref 26.0–34.0)
MCHC: 31.6 g/dL — AB (ref 32.0–36.0)
MCV: 90.3 fL (ref 80.0–100.0)
Platelets: 99 10*3/uL — ABNORMAL LOW (ref 150–440)
RBC: 3.95 MIL/uL (ref 3.80–5.20)
RDW: 16.8 % — AB (ref 11.5–14.5)
WBC: 3.6 10*3/uL (ref 3.6–11.0)

## 2017-03-27 LAB — MRSA PCR SCREENING: MRSA BY PCR: NEGATIVE

## 2017-03-27 LAB — LACTIC ACID, PLASMA
LACTIC ACID, VENOUS: 1.8 mmol/L (ref 0.5–1.9)
LACTIC ACID, VENOUS: 1.3 mmol/L (ref 0.5–1.9)

## 2017-03-27 LAB — PROCALCITONIN: Procalcitonin: <0.1 ng/mL

## 2017-03-27 MED ORDER — ORAL CARE MOUTH RINSE
15.0000 mL | Freq: Four times a day (QID) | OROMUCOSAL | Status: DC
  Administered 2017-03-27 – 2017-04-06 (×23): 15 mL via OROMUCOSAL

## 2017-03-27 MED ORDER — DEXTROSE 50 % IV SOLN
1.0000 | Freq: Once | INTRAVENOUS | Status: AC
  Administered 2017-03-27: 11:00:00 via INTRAVENOUS

## 2017-03-27 MED ORDER — OLANZAPINE 10 MG IM SOLR
10.0000 mg | Freq: Two times a day (BID) | INTRAMUSCULAR | Status: DC
  Administered 2017-03-27 – 2017-03-28 (×2): 10 mg via INTRAMUSCULAR
  Filled 2017-03-27 (×4): qty 10

## 2017-03-27 MED ORDER — KCL IN DEXTROSE-NACL 20-5-0.45 MEQ/L-%-% IV SOLN
INTRAVENOUS | Status: DC
  Administered 2017-03-27: 09:00:00 via INTRAVENOUS
  Filled 2017-03-27 (×2): qty 1000

## 2017-03-27 MED ORDER — DEXTROSE 50 % IV SOLN: 1.0000 | Freq: Once | INTRAVENOUS | Status: DC

## 2017-03-27 MED ORDER — DEXTROSE 50 % IV SOLN
INTRAVENOUS | Status: AC
  Administered 2017-03-27: 50 mL
  Filled 2017-03-27: qty 50

## 2017-03-27 MED ORDER — SODIUM CHLORIDE 0.9 % IV BOLUS (SEPSIS)
1000.0000 mL | Freq: Once | INTRAVENOUS | Status: AC
  Administered 2017-03-27: 1000 mL via INTRAVENOUS

## 2017-03-27 MED ORDER — DEXTROSE 50 % IV SOLN
INTRAVENOUS | Status: AC
  Filled 2017-03-27: qty 50

## 2017-03-27 MED ORDER — SODIUM CHLORIDE 0.9 % IV SOLN
INTRAVENOUS | Status: DC
  Administered 2017-03-28: via INTRAVENOUS

## 2017-03-27 MED ORDER — SODIUM CHLORIDE 4 MEQ/ML IV SOLN
INTRAVENOUS | Status: DC
  Administered 2017-03-27 – 2017-03-29 (×4): via INTRAVENOUS
  Filled 2017-03-27 (×9): qty 1000

## 2017-03-27 MED ORDER — SODIUM CHLORIDE 0.9 % IV SOLN
0.0000 ug/min | INTRAVENOUS | Status: DC
  Administered 2017-03-28: 20 ug/min via INTRAVENOUS
  Administered 2017-03-28: 400 ug/min via INTRAVENOUS
  Filled 2017-03-27 (×5): qty 4

## 2017-03-27 MED ORDER — NOREPINEPHRINE BITARTRATE 1 MG/ML IV SOLN
0.0000 ug/min | INTRAVENOUS | Status: DC
  Administered 2017-03-27: 4 ug/min via INTRAVENOUS
  Filled 2017-03-27 (×3): qty 16

## 2017-03-27 MED ORDER — ACETAMINOPHEN 650 MG RE SUPP: 650.0000 mg | Freq: Four times a day (QID) | RECTAL | Status: DC | PRN

## 2017-03-27 MED ORDER — DEXTROSE 50 % IV SOLN
1.0000 | Freq: Once | INTRAVENOUS | Status: AC
  Administered 2017-03-27: 50 mL via INTRAVENOUS

## 2017-03-27 MED ORDER — DEXTROSE-NACL 5-0.45 % IV SOLN
INTRAVENOUS | Status: DC
Start: 2017-03-27 — End: 2017-03-27
  Administered 2017-03-27: 06:00:00 via INTRAVENOUS

## 2017-03-27 MED ORDER — DEXTROSE 50 % IV SOLN
1.0000 | Freq: Once | INTRAVENOUS | Status: DC
  Filled 2017-03-27: qty 50

## 2017-03-27 MED ORDER — VANCOMYCIN HCL 10 G IV SOLR
1250.0000 mg | Freq: Three times a day (TID) | INTRAVENOUS | Status: DC
  Filled 2017-03-27 (×2): qty 1250

## 2017-03-27 MED ORDER — PIPERACILLIN-TAZOBACTAM 3.375 G IVPB
3.3750 g | Freq: Three times a day (TID) | INTRAVENOUS | Status: DC
  Administered 2017-03-27 – 2017-03-30 (×9): 3.375 g via INTRAVENOUS
  Filled 2017-03-27 (×10): qty 50

## 2017-03-27 MED ORDER — ACETAMINOPHEN 325 MG PO TABS
650.0000 mg | ORAL_TABLET | Freq: Four times a day (QID) | ORAL | Status: DC | PRN
  Administered 2017-03-31: 650 mg via ORAL
  Filled 2017-03-27: qty 2

## 2017-03-27 MED ORDER — CHLORHEXIDINE GLUCONATE 0.12% ORAL RINSE (MEDLINE KIT)
15.0000 mL | Freq: Two times a day (BID) | OROMUCOSAL | Status: DC
  Administered 2017-03-27 – 2017-04-08 (×15): 15 mL via OROMUCOSAL
  Filled 2017-03-27 (×2): qty 15

## 2017-03-27 NOTE — Progress Notes (Signed)
Notified Annabelle HarmanDana regarding patients blood sugar of 68. Increased maint fluids to 100 ml/hr.

## 2017-03-27 NOTE — Clinical Social Work Note (Addendum)
Pt experiencing hypothermia with a rectal temp of 91.2. Bair hugger applied. Pt also hypoglycemia, not eating, drinking, or talking at this time. Order put in to be reevaluated by psych. Per MD pt to transferred to step down. 30 Day PASRR approved today: [id number]. Pt's legal Guardian is Hilbert OdorVanda Thomas [phone]) with [employer]. CSW provided pt update and room change to Ms. Thomas. CSW provided handoff report to CSW VernonMonica. Social Work continuing to follow.   Corlis HoveJeneya Zell Hylton, Theresia MajorsLCSWA, [facility] Clinical Social Worker-ED [phone]

## 2017-03-27 NOTE — Progress Notes (Signed)
Updated Dr. Cheron SchaumannGouro on patient reason for coming into hospital, current situation with hypoglycemia and hypothermia.  Verbal orders obtained.

## 2017-03-27 NOTE — ED Notes (Addendum)
Dr Dolores FrameSung made aware of pt's FSBS recheck(s) of 43 and 48 mg/dL; order to be entered for 1L bolus of D5/0.45% NS.

## 2017-03-27 NOTE — Progress Notes (Signed)
PT Cancellation Note  Patient Details Name: Sarah CapeDeborah Hurst MRN: [id number] DOB: 04-27-1961   Cancelled Treatment:    Reason Eval/Treat Not Completed: Patient not medically ready Pt transferred to CCU from ED.  Spoke with nursing who report that pt is "quite ill" at this time.  This PT saw pt in the ED (10/23) and she had completely flat affect t/ot he session and needed max assist to get to EOB with no ability to get to standing.  Her status has changed and nursing agreed that holding PT at this time is appropriate.  It status seems similar tomorrow we will likely complete PT orders and wait for new orders should she become appropriate.  Malachi ProGalen R Rosealyn Little, DPT 03/27/2017, 4:16 PM

## 2017-03-27 NOTE — Progress Notes (Signed)
Pharmacy Antibiotic Note  Sarah Hurst is a 56 y.o. female admitted on 03/24/2017 with sepsis.  Pharmacy has been consulted for Vancomycin and Zosyn dosing.  Plan: Ke: 0.104   T1/2: 6.7  Vd: 60.9  DW: 87kg  Will start Vancomycin 1250mg  every 8 hours. Trough level ordered prior to 4th dose. Will monitor renal function and adjust dose as needed.   Start Zosyn 3.375 IV EI every 8 hours.   Height: 5\' 2"  (157.5 cm) Weight: (!) 317 lb (143.8 kg) IBW/kg (Calculated) : 50.1  Temp (24hrs), Avg:91.5 F (33.1 C), Min:91.2 F (32.9 C), Max:91.8 F (33.2 C)   Recent Labs Lab 03/24/17 1258 03/26/17 1750 03/27/17 0817  WBC 4.2 4.0 3.6  CREATININE 0.61 0.49 0.61    Estimated Creatinine Clearance: 107.3 mL/min (by C-G formula based on SCr of 0.61 mg/dL).    No Known Allergies  Antimicrobials this admission: 10/25 Zosyn >>  10/25 vancomycin  >>   Dose adjustments this admission:  Microbiology results: 10/25  MRSA PCR: pending   Thank you for allowing pharmacy to be a part of this patient's care.  Gardner CandleSheema M Tanganika Barradas, PharmD, BCPS Clinical Pharmacist 03/27/2017 11:52 AM

## 2017-03-27 NOTE — Consult Note (Signed)
Name: Sarah Hurst MRN: [id number] DOB: 09/25/1960    ADMISSION DATE:  03/24/2017 CONSULTATION DATE: 03/27/2017  REFERRING MD : Dr. Amado Coe  CHIEF COMPLAINT: Hypoglycemia   BRIEF PATIENT DESCRIPTION: 56 yo female admitted 10/25 due to hypoglycemia, hypothermia and hypotension concerning for sepsis   SIGNIFICANT EVENTS  10/25-Pt admitted to ICU   STUDIES:  None   HISTORY OF PRESENT ILLNESS:   This is a 56 yo female with a PMH of OSA, Schizoaffective Disorder, Mental Disability, GERD, and Asthma.  She presented to [facility] ER 10/25 from a group home with hypoglycemia and poor po intake.  Per ER notes the pt was recently evaluated in the ER by neurology and psychiatry on 10/22 due to multiple falls and somnolence, MRI and CT head results negative at that time and symptoms thought to be psych related, however psychiatrist did not think pt needed to be admitted to behavioral unit at that time and could be discharged home.  This presentation in the ER pt found to be hypoglycemic requiring multiple doses of D50, hypothermic rectal temp 91.2 F, and nonverbal unable to follow commands.  She was subsequently admitted to ICU by hospitalist team for further workup and treatment PCCM consulted.   PAST MEDICAL HISTORY :   has a past medical history of Asthma; GERD (gastroesophageal reflux disease); Mental disability; Schizoaffective disorder, bipolar type (HCC); and Sleep apnea.  has a past surgical history that includes Wrist surgery. Prior to Admission medications   Medication Sig Start Date End Date Taking? Authorizing Provider  acetaminophen (TYLENOL) 500 MG tablet Take 1,000 mg by mouth every 4 (four) hours as needed for moderate pain, fever or headache.   Yes [provider]  albuterol (PROVENTIL HFA;VENTOLIN HFA) 108 (90 Base) MCG/ACT inhaler Inhale 2 puffs into the lungs every 4 (four) hours as needed for wheezing or shortness of breath.   Yes [provider]  benztropine  (COGENTIN) 1 MG tablet Take 1 mg by mouth 2 (two) times daily.   Yes [provider]  famotidine (PEPCID) 20 MG tablet Take 20 mg by mouth daily.   Yes [provider]  insulin lispro (HUMALOG) 100 UNIT/ML injection Inject 0-10 Units into the skin 4 (four) times daily -  before meals and at bedtime. Patient takes per sliding scale; bg 70-149: 0 units, bg 150-200: 2 units, bg 201-250: 4 units, bg 251-300: 6 units, bg 301-350: 8 units, bg 351-400: 10 units, bg 401+: 12 units and contact medical doctor    Yes [provider]  lactobacillus acidophilus (BACID) TABS tablet Take 1 tablet by mouth 3 (three) times daily.   Yes [provider]  loperamide (ANTI-DIARRHEAL) 2 MG capsule Take 2 mg by mouth daily as needed for diarrhea or loose stools.   Yes [provider]  Multiple Vitamins-Minerals (THERA-M PO) Take 1 tablet by mouth daily.   Yes [provider]  ondansetron (ZOFRAN) 4 MG tablet Take 4 mg by mouth every 6 (six) hours as needed for nausea or vomiting.   Yes [provider]  paliperidone (INVEGA SUSTENNA) 156 MG/ML SUSP injection Inject 156 mg into the muscle every 30 (thirty) days.   Yes [provider]  risperiDONE (RISPERDAL) 2 MG tablet Take 2 mg by mouth 2 (two) times daily.    Yes [provider]  zolpidem (AMBIEN) 5 MG tablet Take 5 mg by mouth at bedtime as needed for sleep.   Yes [provider]   No Known Allergies  FAMILY  HISTORY:  family history is not on file. SOCIAL HISTORY:  reports that she has quit smoking. She has never used smokeless tobacco. She reports that she does not drink alcohol or use drugs.  REVIEW OF SYSTEMS:   Unable to assess pt nonverbal at this time.  SUBJECTIVE:  Unable to assess pt nonverbal at this time.  VITAL SIGNS: Temp:  [91.2 F (32.9 C)-93.2 F (34 C)] 93.2 F (34 C) (10/25 1220) Pulse Rate:  [57-68] 62 (10/25 1220) Resp:  [9-20] 11 (10/25 1220) BP:  (65-124)/(44-97) 75/61 (10/25 1220) SpO2:  [97 %-100 %] 100 % (10/25 1220)  PHYSICAL EXAMINATION: General: well developed, well nourished female, NAD  Neuro: nonverbal, not following commands, withdraws from pain, PERRL   HEENT: supple, no JVD  Cardiovascular: nsr, s1s2, no M/R/G Lungs: rhonchi throughout, even, non labored  Abdomen: +BS x4, soft, obese, non distended  Musculoskeletal: normal bulk and tone, no edema  Skin: intact no rashes or lesions    Recent Labs Lab 03/24/17 1258 03/26/17 1750 03/27/17 0817  NA 149* 148* 147*  K 3.8 3.3* 3.4*  CL 112* 117* 113*  CO2 28 24 29   BUN 14 9 10   CREATININE 0.61 0.49 0.61  GLUCOSE 77 74 85    Recent Labs Lab 03/24/17 1258 03/26/17 1750 03/27/17 0817  HGB 10.7* 10.7* 11.3*  HCT 33.5* 34.2* 35.6  WBC 4.2 4.0 3.6  PLT 98* 104* 99*   No results found.  ASSESSMENT / PLAN: Acute encephalopathy of unknown etiology  Possible sepsis although source unclear Hypotension secondary to possible sepsis vs. volume depletion  Hypoglycemia  Hypokalemia Hx: OSA and Schizoaffective Disorder  P: Prn supplemental O2 to maintain O2 sats >92% or for dyspnea  Psychiatry consulted appreciate input Avoid sedating medications Frequent reorientation  Trend WBC and monitor fever curve Trend PCT and lactic acid Follow cultures Continue abx for now  Aggressive iv fluid resuscitation  Maintain map >65 D10W1/2 NS with 40 meq KCL @75  ml/hr CBG's q1hr until 4 consecutive readings >90 Hold po outpatient medications until mentation improves  -Pts legal guardian Hilbert OdorVanda Thomas updated via telephone today regarding plan of care and all questions answered.  Sonda Rumbleana Blakeney, AGNP  Pulmonary/Critical Care Pager [phone] (please enter 7 digits) PCCM Consult Pager [phone] (please enter 7 digits)  PCCM ATTENDING ATTESTATION:  I have evaluated patient with the APP Blakeney, reviewed database in its entirety and discussed care plan in detail.  In addition, this patient was discussed on multidisciplinary rounds.   Important exam findings: Somnolent No respiratory distress HEENT WNL Neck supple, no JVD Chest clear Reg, no M Obese, soft, NT Ext cool, no edema EOMI, PERRL, MAEs, not F/C  CXR: NACPD   Major problems addressed by PCCM team: Extensive psychiatric history - resides in group home Acute AMS Hypothermia Hypoglycemia Hypernatremia OSA Possible severe sepsis Hypotension   PLAN/REC: D10 infusion Hold all psych meds Warming blanket Empiric abx Norepi to maintain MAP > 65 mmHg  Billy Fischeravid Kelyn Koskela, MD PCCM service Mobile [phone] Pager [phone] 03/27/2017 5:12 PM

## 2017-03-27 NOTE — Progress Notes (Signed)
Patient with rectal temperature of 91.2.  Rectal probe placed and bair hugger applied per verbal order from Dr. Cheron SchaumannGouro.

## 2017-03-27 NOTE — Progress Notes (Addendum)
Patient is non-verbal at this time and will not follow simple commands.  Pupil assessment attempted but patient thrashes head around and does not allow assessment.

## 2017-03-27 NOTE — Progress Notes (Signed)
Peri care and partial linen change completed.  Patient external catheter and tubing changed.

## 2017-03-27 NOTE — Consult Note (Signed)
Sarah Hurst   Reason for Hurst:  Hurst for 56 year old woman with schizophrenia currently on the intensive care unit with hypoglycemia Referring Physician:  Gouru Patient Identification: Sarah Hurst MRN:  [id number] Principal Diagnosis: Schizoaffective disorder, bipolar type [facility]) Diagnosis:   Patient Active Problem List   Diagnosis Date Noted  . Hypoglycemia [E16.2] 03/27/2017  . Schizoaffective disorder, bipolar type ([place]) [F25.0] 03/24/2017    Total Time spent with patient: 1 hour  Subjective:   Sarah Hurst is a 56 y.o. female patient admitted with patient not able to give information.  HPI:  This is a 56 year old woman with a history of schizophrenia who came to the emergency room initially with medical complaints but then became more confused. Neurologic workup was negative. Patient has stopped eating and become less and less responsive. Currently the patient is in the intensive care unit and is not responding verbally to me. Hard to tell if she is listening or how awake she is. Patient had been continued on her oral antipsychotics until yesterday. She is normally also on long-acting injectable medicine but it is not known when the last dosage was given.  Social history: Patient has a legal guardian. Lives in a group home.  Medical history: Patient has diabetes. Overweight. Currently hypoglycemic not eating.  Substance abuse history: None known  Past Psychiatric History: Patient has a long history of schizophrenia multiple prior hospitalizations. Had been seen several times at the psychiatric facilities of our system recently. Often with physical complaints. Usually was able to be sent back home to her group home without difficulty. Unknown if she has been suicidal in the past.  Risk to Self: Is patient at risk for suicide?: No Risk to Others:   Prior Inpatient Therapy:   Prior Outpatient Therapy:    Past Medical History:  Past Medical History:   Diagnosis Date  . Asthma   . GERD (gastroesophageal reflux disease)   . Mental disability   . Schizoaffective disorder, bipolar type ([place])   . Sleep apnea     Past Surgical History:  Procedure Laterality Date  . WRIST SURGERY     Family History: No family history on file. Family Psychiatric  History: Unknown Social History:  History  Alcohol Use No     History  Drug Use No    Social History   Social History  . Marital status: Single    Spouse name: N/A  . Number of children: N/A  . Years of education: N/A   Social History Main Topics  . Smoking status: Former Research scientist (life sciences)  . Smokeless tobacco: Never Used  . Alcohol use No  . Drug use: No  . Sexual activity: Not Asked   Other Topics Concern  . None   Social History Narrative  . None   Additional Social History:    Allergies:  No Known Allergies  Labs:  Results for orders placed or performed during the hospital encounter of 03/24/17 (from the past 48 hour(s))  Glucose, capillary     Status: Abnormal   Collection Time: 03/26/17  7:54 AM  Result Value Ref Range   Glucose-Capillary 38 (LL) 65 - 99 mg/dL   Comment 1 Notify RN   Glucose, capillary     Status: None   Collection Time: 03/26/17  8:47 AM  Result Value Ref Range   Glucose-Capillary 72 65 - 99 mg/dL  Glucose, capillary     Status: None   Collection Time: 03/26/17 10:09 AM  Result Value Ref Range  Glucose-Capillary 77 65 - 99 mg/dL  Glucose, capillary     Status: Abnormal   Collection Time: 03/26/17  1:30 PM  Result Value Ref Range   Glucose-Capillary 58 (L) 65 - 99 mg/dL  Glucose, capillary     Status: None   Collection Time: 03/26/17  2:56 PM  Result Value Ref Range   Glucose-Capillary 90 65 - 99 mg/dL  Glucose, capillary     Status: None   Collection Time: 03/26/17  5:20 PM  Result Value Ref Range   Glucose-Capillary 67 65 - 99 mg/dL  CBC     Status: Abnormal   Collection Time: 03/26/17  5:50 PM  Result Value Ref Range   WBC 4.0 3.6 -  11.0 K/uL   RBC 3.78 (L) 3.80 - 5.20 MIL/uL   Hemoglobin 10.7 (L) 12.0 - 16.0 g/dL   HCT 34.2 (L) 35.0 - 47.0 %   MCV 90.4 80.0 - 100.0 fL   MCH 28.3 26.0 - 34.0 pg   MCHC 31.3 (L) 32.0 - 36.0 g/dL   RDW 16.7 (H) 11.5 - 14.5 %   Platelets 104 (L) 150 - 440 K/uL  Comprehensive metabolic panel     Status: Abnormal   Collection Time: 03/26/17  5:50 PM  Result Value Ref Range   Sodium 148 (H) 135 - 145 mmol/L   Potassium 3.3 (L) 3.5 - 5.1 mmol/L   Chloride 117 (H) 101 - 111 mmol/L   CO2 24 22 - 32 mmol/L   Glucose, Bld 74 65 - 99 mg/dL   BUN 9 6 - 20 mg/dL   Creatinine, Ser 0.49 0.44 - 1.00 mg/dL   Calcium 8.0 (L) 8.9 - 10.3 mg/dL   Total Protein 5.2 (L) 6.5 - 8.1 g/dL   Albumin 2.4 (L) 3.5 - 5.0 g/dL   AST 64 (H) 15 - 41 U/L   ALT 42 14 - 54 U/L   Alkaline Phosphatase 44 38 - 126 U/L   Total Bilirubin 0.6 0.3 - 1.2 mg/dL   GFR calc non Af Amer >60 >60 mL/min   GFR calc Af Amer >60 >60 mL/min    Comment: (NOTE) The eGFR has been calculated using the CKD EPI equation. This calculation has not been validated in all clinical situations. eGFR's persistently <60 mL/min signify possible Chronic Kidney Disease.    Anion gap 7 5 - 15  Glucose, capillary     Status: Abnormal   Collection Time: 03/26/17  8:16 PM  Result Value Ref Range   Glucose-Capillary 61 (L) 65 - 99 mg/dL  Glucose, capillary     Status: None   Collection Time: 03/26/17 10:52 PM  Result Value Ref Range   Glucose-Capillary 84 65 - 99 mg/dL  Glucose, capillary     Status: Abnormal   Collection Time: 03/27/17  3:57 AM  Result Value Ref Range   Glucose-Capillary 45 (L) 65 - 99 mg/dL  Glucose, capillary     Status: None   Collection Time: 03/27/17  5:10 AM  Result Value Ref Range   Glucose-Capillary 71 65 - 99 mg/dL  Glucose, capillary     Status: Abnormal   Collection Time: 03/27/17  6:09 AM  Result Value Ref Range   Glucose-Capillary 43 (LL) 65 - 99 mg/dL   Comment 1 Call MD NNP PA CNM   Glucose, capillary      Status: Abnormal   Collection Time: 03/27/17  6:11 AM  Result Value Ref Range   Glucose-Capillary 48 (L) 65 - 99 mg/dL  Glucose, capillary     Status: Abnormal   Collection Time: 03/27/17  7:15 AM  Result Value Ref Range   Glucose-Capillary 128 (H) 65 - 99 mg/dL  CBC     Status: Abnormal   Collection Time: 03/27/17  8:17 AM  Result Value Ref Range   WBC 3.6 3.6 - 11.0 K/uL   RBC 3.95 3.80 - 5.20 MIL/uL   Hemoglobin 11.3 (L) 12.0 - 16.0 g/dL   HCT 35.6 35.0 - 47.0 %   MCV 90.3 80.0 - 100.0 fL   MCH 28.5 26.0 - 34.0 pg   MCHC 31.6 (L) 32.0 - 36.0 g/dL   RDW 16.8 (H) 11.5 - 14.5 %   Platelets 99 (L) 150 - 440 K/uL  Comprehensive metabolic panel     Status: Abnormal   Collection Time: 03/27/17  8:17 AM  Result Value Ref Range   Sodium 147 (H) 135 - 145 mmol/L   Potassium 3.4 (L) 3.5 - 5.1 mmol/L   Chloride 113 (H) 101 - 111 mmol/L   CO2 29 22 - 32 mmol/L   Glucose, Bld 85 65 - 99 mg/dL   BUN 10 6 - 20 mg/dL   Creatinine, Ser 0.61 0.44 - 1.00 mg/dL   Calcium 8.9 8.9 - 10.3 mg/dL   Total Protein 5.8 (L) 6.5 - 8.1 g/dL   Albumin 2.6 (L) 3.5 - 5.0 g/dL   AST 61 (H) 15 - 41 U/L   ALT 47 14 - 54 U/L   Alkaline Phosphatase 60 38 - 126 U/L   Total Bilirubin 0.5 0.3 - 1.2 mg/dL   GFR calc non Af Amer >60 >60 mL/min   GFR calc Af Amer >60 >60 mL/min    Comment: (NOTE) The eGFR has been calculated using the CKD EPI equation. This calculation has not been validated in all clinical situations. eGFR's persistently <60 mL/min signify possible Chronic Kidney Disease.    Anion gap 5 5 - 15  TSH     Status: None   Collection Time: 03/27/17  8:17 AM  Result Value Ref Range   TSH 3.525 0.350 - 4.500 uIU/mL    Comment: Performed by a 3rd Generation assay with a functional sensitivity of <=0.01 uIU/mL.  Procalcitonin - Baseline     Status: None   Collection Time: 03/27/17  8:17 AM  Result Value Ref Range   Procalcitonin <0.10 ng/mL    Comment:        Interpretation: PCT  (Procalcitonin) <= 0.5 ng/mL: Systemic infection (sepsis) is not likely. Local bacterial infection is possible. (NOTE)         ICU PCT Algorithm               Non ICU PCT Algorithm    ----------------------------     ------------------------------         PCT < 0.25 ng/mL                 PCT < 0.1 ng/mL     Stopping of antibiotics            Stopping of antibiotics       strongly encouraged.               strongly encouraged.    ----------------------------     ------------------------------       PCT level decrease by               PCT < 0.25 ng/mL       >= 80% from  peak PCT       OR PCT 0.25 - 0.5 ng/mL          Stopping of antibiotics                                             encouraged.     Stopping of antibiotics           encouraged.    ----------------------------     ------------------------------       PCT level decrease by              PCT >= 0.25 ng/mL       < 80% from peak PCT        AND PCT >= 0.5 ng/mL            Continuin g antibiotics                                              encouraged.       Continuing antibiotics            encouraged.    ----------------------------     ------------------------------     PCT level increase compared          PCT > 0.5 ng/mL         with peak PCT AND          PCT >= 0.5 ng/mL             Escalation of antibiotics                                          strongly encouraged.      Escalation of antibiotics        strongly encouraged.   Glucose, capillary     Status: Abnormal   Collection Time: 03/27/17  8:48 AM  Result Value Ref Range   Glucose-Capillary 63 (L) 65 - 99 mg/dL  Glucose, capillary     Status: None   Collection Time: 03/27/17  9:29 AM  Result Value Ref Range   Glucose-Capillary 76 65 - 99 mg/dL   Comment 1 Notify RN    Comment 2 Document in Chart   Glucose, capillary     Status: None   Collection Time: 03/27/17 10:12 AM  Result Value Ref Range   Glucose-Capillary 73 65 - 99 mg/dL  Glucose, capillary      Status: Abnormal   Collection Time: 03/27/17 10:54 AM  Result Value Ref Range   Glucose-Capillary 64 (L) 65 - 99 mg/dL  Glucose, capillary     Status: None   Collection Time: 03/27/17 11:38 AM  Result Value Ref Range   Glucose-Capillary 81 65 - 99 mg/dL  MRSA PCR Screening     Status: None   Collection Time: 03/27/17 12:17 PM  Result Value Ref Range   MRSA by PCR NEGATIVE NEGATIVE    Comment:        The GeneXpert MRSA Assay (FDA approved for NASAL specimens only), is one component of a comprehensive MRSA colonization surveillance program. It is not intended to diagnose MRSA infection nor to guide or monitor treatment for MRSA  infections.   Glucose, capillary     Status: Abnormal   Collection Time: 03/27/17 12:39 PM  Result Value Ref Range   Glucose-Capillary 37 (LL) 65 - 99 mg/dL   Comment 1 Notify RN    Comment 2 Repeat Test   Glucose, capillary     Status: Abnormal   Collection Time: 03/27/17 12:41 PM  Result Value Ref Range   Glucose-Capillary 38 (LL) 65 - 99 mg/dL  Glucose, capillary     Status: None   Collection Time: 03/27/17  1:17 PM  Result Value Ref Range   Glucose-Capillary 72 65 - 99 mg/dL  Lactic acid, plasma     Status: None   Collection Time: 03/27/17  1:20 PM  Result Value Ref Range   Lactic Acid, Venous 1.8 0.5 - 1.9 mmol/L  Glucose, capillary     Status: Abnormal   Collection Time: 03/27/17  2:06 PM  Result Value Ref Range   Glucose-Capillary 56 (L) 65 - 99 mg/dL  Glucose, capillary     Status: Abnormal   Collection Time: 03/27/17  2:08 PM  Result Value Ref Range   Glucose-Capillary 50 (L) 65 - 99 mg/dL  Glucose, capillary     Status: Abnormal   Collection Time: 03/27/17  2:28 PM  Result Value Ref Range   Glucose-Capillary 133 (H) 65 - 99 mg/dL  Urinalysis, Complete w Microscopic     Status: Abnormal   Collection Time: 03/27/17  2:41 PM  Result Value Ref Range   Color, Urine YELLOW (A) YELLOW   APPearance CLEAR (A) CLEAR   Specific Gravity,  Urine 1.019 1.005 - 1.030   pH 5.0 5.0 - 8.0   Glucose, UA NEGATIVE NEGATIVE mg/dL   Hgb urine dipstick NEGATIVE NEGATIVE   Bilirubin Urine NEGATIVE NEGATIVE   Ketones, ur NEGATIVE NEGATIVE mg/dL   Protein, ur 30 (A) NEGATIVE mg/dL   Nitrite NEGATIVE NEGATIVE   Leukocytes, UA NEGATIVE NEGATIVE   RBC / HPF 0-5 0 - 5 RBC/hpf   WBC, UA 0-5 0 - 5 WBC/hpf   Bacteria, UA NONE SEEN NONE SEEN   Squamous Epithelial / LPF NONE SEEN NONE SEEN   Mucus PRESENT    Hyaline Casts, UA PRESENT   Glucose, capillary     Status: None   Collection Time: 03/27/17  3:06 PM  Result Value Ref Range   Glucose-Capillary 68 65 - 99 mg/dL  Glucose, capillary     Status: None   Collection Time: 03/27/17  3:28 PM  Result Value Ref Range   Glucose-Capillary 73 65 - 99 mg/dL  Lactic acid, plasma     Status: None   Collection Time: 03/27/17  4:07 PM  Result Value Ref Range   Lactic Acid, Venous 1.3 0.5 - 1.9 mmol/L  Glucose, capillary     Status: None   Collection Time: 03/27/17  5:13 PM  Result Value Ref Range   Glucose-Capillary 83 65 - 99 mg/dL  Glucose, capillary     Status: None   Collection Time: 03/27/17  6:01 PM  Result Value Ref Range   Glucose-Capillary 89 65 - 99 mg/dL  Glucose, capillary     Status: None   Collection Time: 03/27/17  6:52 PM  Result Value Ref Range   Glucose-Capillary 94 65 - 99 mg/dL  Glucose, capillary     Status: None   Collection Time: 03/27/17  8:12 PM  Result Value Ref Range   Glucose-Capillary 97 65 - 99 mg/dL    Current Facility-Administered Medications  Medication Dose Route Frequency Provider Last Rate Last Dose  . acetaminophen (TYLENOL) tablet 650 mg  650 mg Oral Q6H PRN Sarah Hurst, Aruna, MD       Or  . acetaminophen (TYLENOL) suppository 650 mg  650 mg Rectal Q6H PRN Sarah Hurst, Aruna, MD      . chlorhexidine gluconate (MEDLINE KIT) (PERIDEX) 0.12 % solution 15 mL  15 mL Mouth Rinse BID Awilda Bill, NP   15 mL at 03/27/17 2016  . dextrose 10 % 1,000 mL with sodium  chloride 0.45 %, potassium chloride 40 mEq/L infusion   Intravenous Continuous Awilda Bill, NP 100 mL/hr at 03/27/17 2000    . dextrose 50 % solution 50 mL  1 ampule Intravenous Once Sung, Jade J, MD      . dextrose 50 % solution 50 mL  1 ampule Intravenous Once Awilda Bill, NP      . MEDLINE mouth rinse  15 mL Mouth Rinse QID Awilda Bill, NP   15 mL at 03/27/17 1556  . norepinephrine (LEVOPHED) 16 mg in dextrose 5 % 250 mL (0.064 mg/mL) infusion  0-20 mcg/min Intravenous Titrated Wilhelmina Mcardle, MD 14.1 mL/hr at 03/27/17 2017 15 mcg/min at 03/27/17 2017  . OLANZapine (ZYPREXA) injection 10 mg  10 mg Intramuscular BID Darica Goren T, MD      . piperacillin-tazobactam (ZOSYN) IVPB 3.375 g  3.375 g Intravenous Q8H Pernell Dupre, RPH   Stopped at 03/27/17 1915    Musculoskeletal: Strength & Muscle Tone: decreased Gait & Station: unable to stand Patient leans: N/A  Psychiatric Specialty Exam: Physical Exam  Nursing note and vitals reviewed. Constitutional: She appears well-developed and well-nourished.  HENT:  Head: Normocephalic and atraumatic.  Eyes: Pupils are equal, round, and reactive to light. Conjunctivae are normal.  Neck: Normal range of motion.  Cardiovascular: Normal heart sounds.   Respiratory: Effort normal.  GI: Soft.  Musculoskeletal: Normal range of motion.  Skin: Skin is warm and dry.  Psychiatric: Her affect is blunt. She is noncommunicative.    Review of Systems  Unable to perform ROS: Patient unresponsive    Blood pressure (!) 91/51, pulse 74, temperature (!) 97 F (36.1 C), temperature source Rectal, resp. rate 15, height 5' 2"  (1.575 m), weight (!) 317 lb (143.8 kg), SpO2 100 %.Body mass index is 57.98 kg/m.  General Appearance: Disheveled  Eye Contact:  None  Speech:  Negative  Volume:  Decreased  Mood:  Negative  Affect:  Negative  Thought Process:  NA  Orientation:  Negative  Thought Content:  Negative  Suicidal Thoughts:  No   Homicidal Thoughts:  No  Memory:  Negative  Judgement:  Negative  Insight:  Negative  Psychomotor Activity:  Negative  Concentration:  Concentration: Negative  Recall:  Negative  Fund of Knowledge:  Negative  Language:  Negative  Akathisia:  Negative  Handed:  Right  AIMS (if indicated):     Assets:  Social Support  ADL's:  Impaired  Cognition:  Impaired,  Severe  Sleep:        Treatment Plan Summary: Daily contact with patient to assess and evaluate symptoms and progress in treatment, Medication management and Plan 56 year old woman with schizophrenia. Not responsive. No specific medical issue other than now her hypoglycemia. Neurologic workup had previously been negative. Behavior is most likely in large part related to mental health issues. 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 Normal treatment for catatonia would involve benzodiazepines but given her very  low blood sugars and blood pressure right now I am a little has been to add extra sedation in the form of benzodiazepines. I am going to replace her antipsychotics aggressively with Zyprexa 10 mg intramuscular twice a day. I will continue to follow-up regularly.  Disposition: Supportive therapy provided about ongoing stressors.  Alethia Berthold, MD 03/27/2017 8:33 PM

## 2017-03-27 NOTE — Progress Notes (Signed)
Dr. Tobi BastosPyreddy at bedside asking about patient.  MD updated on why patient was in ED, that patient has been in ED since 10/22, vitals, hypoglycemia, poor PO intake, marginal urine output.  Dr. Tobi BastosPyreddy verbalized that he would send colleague down to see patient.

## 2017-03-27 NOTE — Progress Notes (Signed)
Could not complete admission profile due to the patient being non verbal.

## 2017-03-27 NOTE — H&P (Signed)
[facility]   PATIENT NAME: Sarah CapeDeborah Hurst    MR#:  [id number]  DATE OF BIRTH:  December 01, 1960  DATE OF ADMISSION:  03/24/2017  PRIMARY CARE PHYSICIAN: System, Pcp Not In   REQUESTING/REFERRING PHYSICIAN: Schavitz  CHIEF COMPLAINT:  Hypoglycemia and hypothermia  HISTORY OF PRESENT ILLNESS:  Sarah Hurst  is a 56 y.o. female with a known history of schizoaffective disorder, GERD, sleep apnea) into the emergency department from a group home for frequent falls. Patient was evaluated by neurology and stroke ruled out, patient was seen by psychiatry Dr. Toni Amendclapacs for possible catatonia. According to the nurse's report patient was not eating or drinking from yesterday  Patient is profoundly hypoglycemic today, she has received several D50 ampules with no significant improvement and eventually patient is started on D5 half-normal saline, requiring frequent fingersticks patient is also hypothermic. Not communicating at all. No family members at bedside  PAST MEDICAL HISTORY:   Past Medical History:  Diagnosis Date  . Asthma   . GERD (gastroesophageal reflux disease)   . Mental disability   . Schizoaffective disorder, bipolar type ([facility])   . Sleep apnea    DM ? PAST SURGICAL HISTOIRY:   Past Surgical History:  Procedure Laterality Date  . WRIST SURGERY      SOCIAL HISTORY:   Social History  Substance Use Topics  . Smoking status: Former Games developermoker  . Smokeless tobacco: Never Used  . Alcohol use No    FAMILY HISTORY:  No family history on file.  DRUG ALLERGIES:  No Known Allergies  REVIEW OF SYSTEMS:  Review of systems unobtainable as the patient is delirious  MEDICATIONS AT HOME:   Prior to Admission medications   Medication Sig Start Date End Date Taking? Authorizing Provider  acetaminophen (TYLENOL) 500 MG tablet Take 1,000 mg by mouth every 4 (four) hours as needed for moderate pain, fever or headache.   Yes [provider]  albuterol (PROVENTIL HFA;VENTOLIN HFA) 108 (90 Base) MCG/ACT inhaler Inhale 2 puffs into the lungs every 4 (four) hours as needed for wheezing or shortness of breath.   Yes [provider]  benztropine (COGENTIN) 1 MG tablet Take 1 mg by mouth 2 (two) times daily.   Yes [provider]  famotidine (PEPCID) 20 MG tablet Take 20 mg by mouth daily.   Yes [provider]  insulin lispro (HUMALOG) 100 UNIT/ML injection Inject 0-10 Units into the skin 4 (four) times daily -  before meals and at bedtime. Patient takes per sliding scale; bg 70-149: 0 units, bg 150-200: 2 units, bg 201-250: 4 units, bg 251-300: 6 units, bg 301-350: 8 units, bg 351-400: 10 units, bg 401+: 12 units and contact medical doctor    Yes [provider]  lactobacillus acidophilus (BACID) TABS tablet Take 1 tablet by mouth 3 (three) times daily.   Yes [provider]  loperamide (ANTI-DIARRHEAL) 2 MG capsule Take 2 mg by mouth daily as needed for diarrhea or loose stools.   Yes [provider]  Multiple Vitamins-Minerals (THERA-M PO) Take 1 tablet by mouth daily.   Yes [provider]  ondansetron (ZOFRAN) 4 MG tablet Take 4 mg by mouth every 6 (six) hours as needed for nausea or vomiting.   Yes [provider]  paliperidone (INVEGA SUSTENNA) 156 MG/ML SUSP injection Inject 156 mg into the muscle every 30 (thirty) days.   Yes [provider]  risperiDONE (RISPERDAL) 2 MG tablet Take 2  mg by mouth 2 (two) times daily.    Yes [provider]  zolpidem (AMBIEN) 5 MG tablet Take 5 mg by mouth at bedtime as needed for sleep.   Yes [provider]      VITAL SIGNS:  Blood pressure 94/67, pulse (!) 59, temperature (!) 91.8 F (33.2 C), temperature source Rectal, resp. rate 16, height 5\' 2"  (1.575 m), weight (!) 143.8 kg (317 lb), SpO2 100 %.  PHYSICAL EXAMINATION:  GENERAL:  56 y.o.-year-old patient lying in the bed with no  acute distress.  EYES: Pupils equal, round, reactive to light and accommodation. No scleral icterus.  HEENT: Head atraumatic, normocephalic. Oropharynx and nasopharynx clear.  NECK:  Supple, no jugular venous distention. No thyroid enlargement, no tenderness.  LUNGS: Normal breath sounds bilaterally, no wheezing, rales,rhonchi or crepitation. No use of accessory muscles of respiration.  CARDIOVASCULAR: S1, S2 normal. No murmurs, rubs, or gallops.  ABDOMEN: Soft, nontender, nondistended. Bowel sounds present.   EXTREMITIES: No pedal edema, cyanosis, or clubbing.  NEUROLOGIC: Arousable but not answering any questions   PSYCHIATRIC: The patient is arousable but not answering any questions SKIN: no rash   LABORATORY PANEL:   CBC  Recent Labs Lab 03/27/17 0817  WBC 3.6  HGB 11.3*  HCT 35.6  PLT 99*   ------------------------------------------------------------------------------------------------------------------  Chemistries   Recent Labs Lab 03/27/17 0817  NA 147*  K 3.4*  CL 113*  CO2 29  GLUCOSE 85  BUN 10  CREATININE 0.61  CALCIUM 8.9  AST 61*  ALT 47  ALKPHOS 60  BILITOT 0.5   ------------------------------------------------------------------------------------------------------------------  Cardiac Enzymes  Recent Labs Lab 03/24/17 1258  TROPONINI <0.03   ------------------------------------------------------------------------------------------------------------------  RADIOLOGY:  No results found.  EKG:   Orders placed or performed during the hospital encounter of 03/24/17  . ED EKG  . ED EKG  . EKG 12-Lead  . EKG 12-Lead    IMPRESSION AND PLAN:     # Acute encephalopathy with  Hypoglycemia with hypothermia with possible sepsis /decreased by mouth intake   admit to stepdown unit   Accu-Chek monitoring every hour   pan cultures and chest x-ray Empiric IV antibiotics Zosyn and vancomycin IV fluids with D5  # Schizoaffective disorder  currently seems to be  Catatonic Dr. Toni Amend is actively following  #Thrombocytopenia with no active bleeding could be from sepsis versus medication adverse effects Monitor platelet count closely   # GERD -PPI   # History of asthma exacerbation neb treatments as needed   DVT prophylaxis with SCDs as patient is thrombocytopenic  All the records are reviewed and case discussed with ED provider. Management plans discussed with the patient, family and they are in agreement.  CODE STATUS: fc   TOTAL critical care TIME TAKING CARE OF THIS PATIENT: 43 minutes.   Note: This dictation was prepared with Dragon dictation along with smaller phrase technology. Any transcriptional errors that result from this process are unintentional.  Ramonita Lab M.D on 03/27/2017 at 10:33 AM  Between 7am to 6pm - Pager - [phone]  After 6pm go to [email or web address] - password [email or web address]  [facility]  Office  [phone]  CC: Primary care physician; System, Pcp Not In

## 2017-03-27 NOTE — Progress Notes (Signed)
Spoke with Annabelle Harmanana PA regarding patient not following commands, does have some resistance upper and lower extremities. Lab orders entered. At this point no CT ordered. PA will evaluate patient and place a line. PA will also change d5 to d10, amp of dextrose ordered due to patients blood sugars dropping.

## 2017-03-27 NOTE — Progress Notes (Signed)
Nurse concerned about patient new onset of hypoglycemia and being unable to obtain oral or axillary temperature.  Verbal orders obtained from ED physician to obtain blood work.  Nurse to attempt to get rectal temperature.

## 2017-03-27 NOTE — Procedures (Signed)
Central Venous Catheter Insertion Procedure Note Sarah CapeDeborah Hurst [id number] March 15, 1961  Procedure: Insertion of Central Venous Catheter Indications: Assessment of intravascular volume, Drug and/or fluid administration and Frequent blood sampling  Procedure Details Consent: Risks of procedure as well as the alternatives and risks of each were explained to the (patient/caregiver).  Consent for procedure obtained. Time Out: Verified patient identification, verified procedure, site/side was marked, verified correct patient position, special equipment/implants available, medications/allergies/relevent history reviewed, required imaging and test results available.  Performed  Maximum sterile technique was used including antiseptics, cap, gloves, gown, hand hygiene, mask and sheet. Skin prep: Chlorhexidine; local anesthetic administered A antimicrobial bonded/coated triple lumen catheter was placed in the right internal jugular vein using the Seldinger technique.  Evaluation Blood flow good Complications: No apparent complications Patient did tolerate procedure well. Chest X-ray ordered to verify placement.  CXR: normal.  Right internal jugular central line placed utilizing ultrasound no complications noted during or following procedure.  Sarah Hurst, AGNP  Pulmonary/Critical Care Pager [phone] (please enter 7 digits) PCCM Consult Pager [phone] (please enter 7 digits)  Billy Fischeravid Simonds, MD PCCM service Mobile [phone] Pager [phone] 03/27/2017 5:19 PM

## 2017-03-27 NOTE — Progress Notes (Signed)
Patient with blood glucose of 64 with continuous d5 1/2NS at 75.  Per hypoglycemic protocol 1 ampule of d50 to be administered.

## 2017-03-27 NOTE — Progress Notes (Signed)
Called lab, patient is now ready for culture draw. I cannot hang antibiotics until drawn.

## 2017-03-27 NOTE — Care Management (Signed)
Patient's admit date is actually 03/27/2017.  She presented to the ED on 10/22 from a family care home for "psychaitric issues." Neuro work up is negative. Freq falls. Very flat affect since presentation.  Required admission when became hypothermic, hypoglycemic and  marked decrease in po intake .  Recevied several doses of D 50.  She was evaluated 10/23 by physical therapy in the ED and initial recommendation was skilled nursing facility placement. She was able to participate 10/25.  Started on pressors this afternoon due to hypotension. has legal guardian

## 2017-03-27 NOTE — ED Notes (Addendum)
Pt's FSBS found to be 45 mg/dL. Dr Dolores FrameSung made aware and VORB to administer (1) 50 ml amp of 50% dextrose (D50).

## 2017-03-27 NOTE — Progress Notes (Signed)
Bladder scanned patient per Annabelle Harmanana PA. No urine output. Per scan 208 ml.

## 2017-03-27 NOTE — ED Provider Notes (Signed)
-----------------------------------------   6:13 AM on 03/27/2017 -----------------------------------------  Overnight patient had low blood sugar of 45.  1 amp D50 administered.  Blood sugar was checked an hour later without significant improvement.  Patient has been essentially catatonic and not eating or drinking.  Psychiatric medications held, will reconsult psychiatry.  Will initiate D5 half normal saline infusion and consult medicine for further recommendations.  Discussed with Dr. Tobi BastosPyreddy who will evaluate patient in the emergency department.   Irean HongSung, Natacia Chaisson J, MD 03/27/17 [phone]

## 2017-03-27 NOTE — Progress Notes (Signed)
Telephone report called to Cleda ClarksSandra Borba, RN.  Patient to be transported to ICU bed #4.

## 2017-03-27 NOTE — Progress Notes (Signed)
Spoke with Dr Bard HerbertSimmonds regarding patient blood pressure map less then 65. He will enter orders for pressor. This RN also asked if we were going to do a CT scan. Patient unable to follow any simple commands. At this time no orders for a CT scan.

## 2017-03-28 ENCOUNTER — Ambulatory Visit: Payer: Self-pay | Admitting: Obstetrics and Gynecology

## 2017-03-28 ENCOUNTER — Inpatient Hospital Stay: Payer: Medicaid Other

## 2017-03-28 DIAGNOSIS — R6521 Severe sepsis with septic shock: Secondary | ICD-10-CM

## 2017-03-28 DIAGNOSIS — A419 Sepsis, unspecified organism: Secondary | ICD-10-CM

## 2017-03-28 LAB — COMPREHENSIVE METABOLIC PANEL
ALT: 41 U/L (ref 14–54)
ANION GAP: 1 — AB (ref 5–15)
AST: 50 U/L — ABNORMAL HIGH (ref 15–41)
Albumin: 2.4 g/dL — ABNORMAL LOW (ref 3.5–5.0)
Alkaline Phosphatase: 51 U/L (ref 38–126)
BUN: 12 mg/dL (ref 6–20)
CHLORIDE: 119 mmol/L — AB (ref 101–111)
CO2: 28 mmol/L (ref 22–32)
Calcium: 8.1 mg/dL — ABNORMAL LOW (ref 8.9–10.3)
Creatinine, Ser: 1.12 mg/dL — ABNORMAL HIGH (ref 0.44–1.00)
GFR calc non Af Amer: 53 mL/min — ABNORMAL LOW (ref >60)
Glucose, Bld: 91 mg/dL (ref 65–99)
Potassium: 4.1 mmol/L (ref 3.5–5.1)
SODIUM: 148 mmol/L — AB (ref 135–145)
Total Bilirubin: 0.3 mg/dL (ref 0.3–1.2)
Total Protein: 5.4 g/dL — ABNORMAL LOW (ref 6.5–8.1)

## 2017-03-28 LAB — GLUCOSE, CAPILLARY
GLUCOSE-CAPILLARY: 43 mg/dL — AB (ref 65–99)
GLUCOSE-CAPILLARY: 37 mg/dL — AB (ref 65–99)
GLUCOSE-CAPILLARY: 71 mg/dL (ref 65–99)
GLUCOSE-CAPILLARY: 82 mg/dL (ref 65–99)
GLUCOSE-CAPILLARY: 103 mg/dL — AB (ref 65–99)
GLUCOSE-CAPILLARY: 91 mg/dL (ref 65–99)
GLUCOSE-CAPILLARY: 105 mg/dL — AB (ref 65–99)
GLUCOSE-CAPILLARY: 113 mg/dL — AB (ref 65–99)
GLUCOSE-CAPILLARY: 106 mg/dL — AB (ref 65–99)
GLUCOSE-CAPILLARY: 126 mg/dL — AB (ref 65–99)
GLUCOSE-CAPILLARY: 118 mg/dL — AB (ref 65–99)
Glucose-Capillary: 105 mg/dL — ABNORMAL HIGH (ref 65–99)
Glucose-Capillary: 85 mg/dL (ref 65–99)
Glucose-Capillary: 75 mg/dL (ref 65–99)
Glucose-Capillary: 97 mg/dL (ref 65–99)
Glucose-Capillary: 97 mg/dL (ref 65–99)
Glucose-Capillary: 99 mg/dL (ref 65–99)
Glucose-Capillary: 111 mg/dL — ABNORMAL HIGH (ref 65–99)

## 2017-03-28 LAB — CBC WITH DIFFERENTIAL/PLATELET
Basophils Absolute: 0.1 10*3/uL (ref 0–0.1)
Basophils Relative: 1 %
EOS PCT: 0 %
Eosinophils Absolute: 0 10*3/uL (ref 0–0.7)
HEMATOCRIT: 33.7 % — AB (ref 35.0–47.0)
Hemoglobin: 10.5 g/dL — ABNORMAL LOW (ref 12.0–16.0)
Lymphocytes Relative: 21 %
Lymphs Abs: 1.2 10*3/uL (ref 1.0–3.6)
MCH: 28.2 pg (ref 26.0–34.0)
MCHC: 31.1 g/dL — AB (ref 32.0–36.0)
MCV: 90.4 fL (ref 80.0–100.0)
MONOS PCT: 6 %
Monocytes Absolute: 0.3 10*3/uL (ref 0.2–0.9)
NEUTROS PCT: 72 %
Neutro Abs: 4 10*3/uL (ref 1.4–6.5)
Platelets: 101 10*3/uL — ABNORMAL LOW (ref 150–440)
RBC: 3.72 MIL/uL — AB (ref 3.80–5.20)
RDW: 17.3 % — ABNORMAL HIGH (ref 11.5–14.5)
WBC: 5.6 10*3/uL (ref 3.6–11.0)

## 2017-03-28 LAB — BLOOD CULTURE ID PANEL (REFLEXED)
Acinetobacter baumannii: NOT DETECTED
CANDIDA GLABRATA: NOT DETECTED
CANDIDA TROPICALIS: NOT DETECTED
Candida albicans: NOT DETECTED
Candida krusei: NOT DETECTED
Candida parapsilosis: NOT DETECTED
ENTEROBACTER CLOACAE COMPLEX: NOT DETECTED
ENTEROCOCCUS SPECIES: NOT DETECTED
ESCHERICHIA COLI: NOT DETECTED
Enterobacteriaceae species: NOT DETECTED
Haemophilus influenzae: NOT DETECTED
Klebsiella oxytoca: NOT DETECTED
Klebsiella pneumoniae: NOT DETECTED
LISTERIA MONOCYTOGENES: NOT DETECTED
Methicillin resistance: NOT DETECTED
NEISSERIA MENINGITIDIS: NOT DETECTED
PROTEUS SPECIES: NOT DETECTED
PSEUDOMONAS AERUGINOSA: NOT DETECTED
SERRATIA MARCESCENS: NOT DETECTED
STAPHYLOCOCCUS AUREUS BCID: NOT DETECTED
STAPHYLOCOCCUS SPECIES: DETECTED — AB
Streptococcus agalactiae: NOT DETECTED
Streptococcus pneumoniae: NOT DETECTED
Streptococcus pyogenes: NOT DETECTED
Streptococcus species: NOT DETECTED

## 2017-03-28 LAB — CORTISOL
CORTISOL PLASMA: 6.5 ug/dL
Cortisol, Plasma: 9.3 ug/dL

## 2017-03-28 LAB — URINE DRUG SCREEN, QUALITATIVE (ARMC ONLY)
AMPHETAMINES, UR SCREEN: NOT DETECTED
Barbiturates, Ur Screen: NOT DETECTED
Benzodiazepine, Ur Scrn: NOT DETECTED
Cannabinoid 50 Ng, Ur ~~LOC~~: NOT DETECTED
Cocaine Metabolite,Ur ~~LOC~~: NOT DETECTED
MDMA (ECSTASY) UR SCREEN: NOT DETECTED
METHADONE SCREEN, URINE: NOT DETECTED
Opiate, Ur Screen: NOT DETECTED
Phencyclidine (PCP) Ur S: NOT DETECTED
TRICYCLIC, UR SCREEN: NOT DETECTED

## 2017-03-28 LAB — URINE CULTURE: CULTURE: NO GROWTH

## 2017-03-28 LAB — HIV ANTIBODY (ROUTINE TESTING W REFLEX): HIV SCREEN 4TH GENERATION: NONREACTIVE

## 2017-03-28 LAB — PROCALCITONIN: Procalcitonin: <0.1 ng/mL

## 2017-03-28 MED ORDER — NOREPINEPHRINE-SODIUM CHLORIDE 16-0.9 MG/250ML-% IV SOLN: 0.0000 ug/min | INTRAVENOUS | Status: DC

## 2017-03-28 MED ORDER — FUROSEMIDE 10 MG/ML IJ SOLN
40.0000 mg | Freq: Once | INTRAMUSCULAR | Status: AC
  Administered 2017-03-28: 40 mg via INTRAVENOUS
  Filled 2017-03-28: qty 4

## 2017-03-28 MED ORDER — NOREPINEPHRINE-SODIUM CHLORIDE 16-0.9 MG/250ML-% IV SOLN
INTRAVENOUS | Status: DC
  Filled 2017-03-28 (×3): qty 250

## 2017-03-28 MED ORDER — VASOPRESSIN 20 UNIT/ML IV SOLN
0.0300 [IU]/min | INTRAVENOUS | Status: DC
  Administered 2017-03-28: 0.03 [IU]/min via INTRAVENOUS
  Filled 2017-03-28: qty 2

## 2017-03-28 MED ORDER — SODIUM CHLORIDE 0.9 % IV SOLN
INTRAVENOUS | Status: DC
  Administered 2017-03-28: 02:00:00 via INTRAVENOUS
  Filled 2017-03-28: qty 250

## 2017-03-28 MED ORDER — ENOXAPARIN SODIUM 40 MG/0.4ML ~~LOC~~ SOLN
40.0000 mg | SUBCUTANEOUS | Status: DC
  Administered 2017-03-28 – 2017-03-29 (×2): 40 mg via SUBCUTANEOUS
  Filled 2017-03-28 (×2): qty 0.4

## 2017-03-28 MED ORDER — NOREPINEPHRINE BITARTRATE 1 MG/ML IV SOLN
0.0000 ug/min | INTRAVENOUS | Status: DC
  Administered 2017-03-28: 35 ug/min via INTRAVENOUS
  Filled 2017-03-28 (×3): qty 16

## 2017-03-28 MED ORDER — HYDROCORTISONE NA SUCCINATE PF 100 MG IJ SOLR
50.0000 mg | Freq: Four times a day (QID) | INTRAMUSCULAR | Status: DC
  Administered 2017-03-28 – 2017-04-02 (×19): 50 mg via INTRAVENOUS
  Filled 2017-03-28: qty 1
  Filled 2017-03-28: qty 2
  Filled 2017-03-28: qty 1
  Filled 2017-03-28 (×2): qty 2
  Filled 2017-03-28 (×2): qty 1
  Filled 2017-03-28 (×4): qty 2
  Filled 2017-03-28: qty 1
  Filled 2017-03-28 (×3): qty 2
  Filled 2017-03-28: qty 1
  Filled 2017-03-28 (×2): qty 2
  Filled 2017-03-28 (×2): qty 1
  Filled 2017-03-28: qty 2

## 2017-03-28 NOTE — Progress Notes (Signed)
Pharmacy Antibiotic Note  Sarah Hurst is a 56 y.o. female admitted on 03/24/2017 with suspected sepsis of unknown origin with hypothermia. 10/26 AM labs showed procalcitonin < 0.01. Pharmacy has been consulted for Zosyn dosing.  Plan: Continue Zosyn 3.375 IV EI every 8 hours. Pharmacy will continue to monitor patient SCr and adjust dose as needed.   Height: 5\' 2"  (157.5 cm) Weight: (!) 317 lb (143.8 kg) IBW/kg (Calculated) : 50.1  Temp (24hrs), Avg:97.8 F (36.6 C), Min:93.4 F (34.1 C), Max:99.3 F (37.4 C)   Recent Labs Lab 03/24/17 1258 03/26/17 1750 03/27/17 0817 03/27/17 1320 03/27/17 1607 03/28/17 0410  WBC 4.2 4.0 3.6  --   --  5.6  CREATININE 0.61 0.49 0.61  --   --  1.12*  LATICACIDVEN  --   --   --  1.8 1.3  --     Estimated Creatinine Clearance: 76.6 mL/min (A) (by C-G formula based on SCr of 1.12 mg/dL (H)).    No Known Allergies  Antimicrobials this admission: 10/25 vancomycin  >> 10/25 10/25 Zosyn >>   Dose adjustments this admission:  Microbiology results: 10/25  MRSA PCR: Negative 10/25 BCx: pending   Thank you for allowing pharmacy to be a part of this patient's care.  Yolanda BonineHannah Layli Hurst, PharmD Pharmacy Resident 03/28/2017 12:54 PM

## 2017-03-28 NOTE — Consult Note (Signed)
[facility] Infectious Disease     Reason for Consult:Sepsis   Referring Physician: Gouru, A Date of Admission:  03/24/2017   Principal Problem:   Schizoaffective disorder, bipolar type ([place]) Active Problems:   Hypoglycemia   HPI: Sarah Hurst is a 56 y.o. female admitted with frequent falls and after some time in ED was found to be hypoglycemic, hypothermic and hypotensive.On admit wbc was 93.4, wbc 4.   UA neg. Has had imaging of Chest, CT head, MRI brain and no obvious etiology. Started on zosyn and IVF. Rmains on pressors.  Past Medical History:  Diagnosis Date  . Asthma   . GERD (gastroesophageal reflux disease)   . Mental disability   . Schizoaffective disorder, bipolar type ([place])   . Sleep apnea    Past Surgical History:  Procedure Laterality Date  . WRIST SURGERY     Social History  Substance Use Topics  . Smoking status: Former Research scientist (life sciences)  . Smokeless tobacco: Never Used  . Alcohol use No   No family history on file.  Allergies: No Known Allergies  Current antibiotics: Antibiotics Given (last 72 hours)    Date/Time Action Medication Dose Rate   03/27/17 1515 New Bag/Given  [awaiting for blood culture- lab]   piperacillin-tazobactam (ZOSYN) IVPB 3.375 g 3.375 g 12.5 mL/hr   03/27/17 2127 New Bag/Given   piperacillin-tazobactam (ZOSYN) IVPB 3.375 g 3.375 g 12.5 mL/hr   03/28/17 0518 New Bag/Given   piperacillin-tazobactam (ZOSYN) IVPB 3.375 g 3.375 g 12.5 mL/hr   03/28/17 1301 New Bag/Given   piperacillin-tazobactam (ZOSYN) IVPB 3.375 g 3.375 g 12.5 mL/hr      MEDICATIONS: . chlorhexidine gluconate (MEDLINE KIT)  15 mL Mouth Rinse BID  . dextrose  1 ampule Intravenous Once  . dextrose  1 ampule Intravenous Once  . enoxaparin (LOVENOX) injection  40 mg Subcutaneous Q24H  . hydrocortisone sod succinate (SOLU-CORTEF) inj  50 mg Intravenous Q6H  . mouth rinse  15 mL Mouth Rinse QID  . OLANZapine  10 mg Intramuscular BID    Review of Systems - unable to  obtain   OBJECTIVE: Temp:  [94.5 F (34.7 C)-99.3 F (37.4 C)] 97.3 F (36.3 C) (10/26 1400) Pulse Rate:  [59-102] 85 (10/26 1300) Resp:  [10-25] 16 (10/26 1400) BP: (67-168)/(37-84) 97/61 (10/26 1400) SpO2:  [91 %-100 %] 100 % (10/26 1300) Physical Exam  Constitutional:  Obese, obtunded, snoring loudly HENT: [place]/AT, PERRLA, no scleral icterus Mouth/Throat: Oropharynx is clear and dry . No oropharyngeal exudate.  Cardiovascular: Normal rate, regular rhythm and normal heart sounds. Exam reveals no gallop and no friction rub.  No murmur heard.  Pulmonary/Chest: Effort normal and breath sounds normal. No respiratory distress.  has no wheezes.  Neck = supple, no nuchal rigidity Abdominal: Soft. Bowel sounds are normal.  exhibits no distension. There is no tenderness.  Lymphadenopathy: no cervical adenopathy. No axillary adenopathy Neurological: lethargic Skin: Skin is warm and dry. No rash noted. No erythema.  Psychiatric: lethargic  LABS: Results for orders placed or performed during the hospital encounter of 03/24/17 (from the past 48 hour(s))  Glucose, capillary     Status: None   Collection Time: 03/26/17  5:20 PM  Result Value Ref Range   Glucose-Capillary 67 65 - 99 mg/dL  CBC     Status: Abnormal   Collection Time: 03/26/17  5:50 PM  Result Value Ref Range   WBC 4.0 3.6 - 11.0 K/uL   RBC 3.78 (L) 3.80 - 5.20 MIL/uL  Hemoglobin 10.7 (L) 12.0 - 16.0 g/dL   HCT 34.2 (L) 35.0 - 47.0 %   MCV 90.4 80.0 - 100.0 fL   MCH 28.3 26.0 - 34.0 pg   MCHC 31.3 (L) 32.0 - 36.0 g/dL   RDW 16.7 (H) 11.5 - 14.5 %   Platelets 104 (L) 150 - 440 K/uL  Comprehensive metabolic panel     Status: Abnormal   Collection Time: 03/26/17  5:50 PM  Result Value Ref Range   Sodium 148 (H) 135 - 145 mmol/L   Potassium 3.3 (L) 3.5 - 5.1 mmol/L   Chloride 117 (H) 101 - 111 mmol/L   CO2 24 22 - 32 mmol/L   Glucose, Bld 74 65 - 99 mg/dL   BUN 9 6 - 20 mg/dL   Creatinine, Ser 0.49 0.44 - 1.00 mg/dL    Calcium 8.0 (L) 8.9 - 10.3 mg/dL   Total Protein 5.2 (L) 6.5 - 8.1 g/dL   Albumin 2.4 (L) 3.5 - 5.0 g/dL   AST 64 (H) 15 - 41 U/L   ALT 42 14 - 54 U/L   Alkaline Phosphatase 44 38 - 126 U/L   Total Bilirubin 0.6 0.3 - 1.2 mg/dL   GFR calc non Af Amer >60 >60 mL/min   GFR calc Af Amer >60 >60 mL/min    Comment: (NOTE) The eGFR has been calculated using the CKD EPI equation. This calculation has not been validated in all clinical situations. eGFR's persistently <60 mL/min signify possible Chronic Kidney Disease.    Anion gap 7 5 - 15  Glucose, capillary     Status: Abnormal   Collection Time: 03/26/17  8:16 PM  Result Value Ref Range   Glucose-Capillary 61 (L) 65 - 99 mg/dL  Glucose, capillary     Status: None   Collection Time: 03/26/17 10:52 PM  Result Value Ref Range   Glucose-Capillary 84 65 - 99 mg/dL  Glucose, capillary     Status: Abnormal   Collection Time: 03/27/17  3:57 AM  Result Value Ref Range   Glucose-Capillary 45 (L) 65 - 99 mg/dL  Glucose, capillary     Status: None   Collection Time: 03/27/17  5:10 AM  Result Value Ref Range   Glucose-Capillary 71 65 - 99 mg/dL  Glucose, capillary     Status: Abnormal   Collection Time: 03/27/17  6:09 AM  Result Value Ref Range   Glucose-Capillary 43 (LL) 65 - 99 mg/dL   Comment 1 Call MD NNP PA CNM    Comment 2 REPEATED TO VERIFY, CHARGE CREDITED     Comment: Performed at [facility], [address] [place], [place] [address]  Glucose, capillary     Status: Abnormal   Collection Time: 03/27/17  6:11 AM  Result Value Ref Range   Glucose-Capillary 48 (L) 65 - 99 mg/dL  Glucose, capillary     Status: Abnormal   Collection Time: 03/27/17  7:15 AM  Result Value Ref Range   Glucose-Capillary 128 (H) 65 - 99 mg/dL  CBC     Status: Abnormal   Collection Time: 03/27/17  8:17 AM  Result Value Ref Range   WBC 3.6 3.6 - 11.0 K/uL   RBC 3.95 3.80 - 5.20 MIL/uL   Hemoglobin 11.3 (L) 12.0 - 16.0 g/dL   HCT 35.6 35.0 -  47.0 %   MCV 90.3 80.0 - 100.0 fL   MCH 28.5 26.0 - 34.0 pg   MCHC 31.6 (L) 32.0 - 36.0 g/dL  RDW 16.8 (H) 11.5 - 14.5 %   Platelets 99 (L) 150 - 440 K/uL  Comprehensive metabolic panel     Status: Abnormal   Collection Time: 03/27/17  8:17 AM  Result Value Ref Range   Sodium 147 (H) 135 - 145 mmol/L   Potassium 3.4 (L) 3.5 - 5.1 mmol/L   Chloride 113 (H) 101 - 111 mmol/L   CO2 29 22 - 32 mmol/L   Glucose, Bld 85 65 - 99 mg/dL   BUN 10 6 - 20 mg/dL   Creatinine, Ser 0.61 0.44 - 1.00 mg/dL   Calcium 8.9 8.9 - 10.3 mg/dL   Total Protein 5.8 (L) 6.5 - 8.1 g/dL   Albumin 2.6 (L) 3.5 - 5.0 g/dL   AST 61 (H) 15 - 41 U/L   ALT 47 14 - 54 U/L   Alkaline Phosphatase 60 38 - 126 U/L   Total Bilirubin 0.5 0.3 - 1.2 mg/dL   GFR calc non Af Amer >60 >60 mL/min   GFR calc Af Amer >60 >60 mL/min    Comment: (NOTE) The eGFR has been calculated using the CKD EPI equation. This calculation has not been validated in all clinical situations. eGFR's persistently <60 mL/min signify possible Chronic Kidney Disease.    Anion gap 5 5 - 15  TSH     Status: None   Collection Time: 03/27/17  8:17 AM  Result Value Ref Range   TSH 3.525 0.350 - 4.500 uIU/mL    Comment: Performed by a 3rd Generation assay with a functional sensitivity of <=0.01 uIU/mL.  Cortisol     Status: None   Collection Time: 03/27/17  8:17 AM  Result Value Ref Range   Cortisol, Plasma 6.5 ug/dL    Comment: (NOTE) AM    6.7 - 22.6 ug/dL PM   <10.0       ug/dL Performed at [facility] [address], [place], [place] [address]   Procalcitonin - Baseline     Status: None   Collection Time: 03/27/17  8:17 AM  Result Value Ref Range   Procalcitonin <0.10 ng/mL    Comment:        Interpretation: PCT (Procalcitonin) <= 0.5 ng/mL: Systemic infection (sepsis) is not likely. Local bacterial infection is possible. (NOTE)         ICU PCT Algorithm               Non ICU PCT Algorithm    ----------------------------      ------------------------------         PCT < 0.25 ng/mL                 PCT < 0.1 ng/mL     Stopping of antibiotics            Stopping of antibiotics       strongly encouraged.               strongly encouraged.    ----------------------------     ------------------------------       PCT level decrease by               PCT < 0.25 ng/mL       >= 80% from peak PCT       OR PCT 0.25 - 0.5 ng/mL          Stopping of antibiotics  encouraged.     Stopping of antibiotics           encouraged.    ----------------------------     ------------------------------       PCT level decrease by              PCT >= 0.25 ng/mL       < 80% from peak PCT        AND PCT >= 0.5 ng/mL            Continuin g antibiotics                                              encouraged.       Continuing antibiotics            encouraged.    ----------------------------     ------------------------------     PCT level increase compared          PCT > 0.5 ng/mL         with peak PCT AND          PCT >= 0.5 ng/mL             Escalation of antibiotics                                          strongly encouraged.      Escalation of antibiotics        strongly encouraged.   Glucose, capillary     Status: Abnormal   Collection Time: 03/27/17  8:48 AM  Result Value Ref Range   Glucose-Capillary 63 (L) 65 - 99 mg/dL  Glucose, capillary     Status: None   Collection Time: 03/27/17  9:29 AM  Result Value Ref Range   Glucose-Capillary 76 65 - 99 mg/dL   Comment 1 Notify RN    Comment 2 Document in Chart   Glucose, capillary     Status: None   Collection Time: 03/27/17 10:12 AM  Result Value Ref Range   Glucose-Capillary 73 65 - 99 mg/dL  Glucose, capillary     Status: Abnormal   Collection Time: 03/27/17 10:54 AM  Result Value Ref Range   Glucose-Capillary 64 (L) 65 - 99 mg/dL  Glucose, capillary     Status: None   Collection Time: 03/27/17 11:38 AM  Result Value Ref Range    Glucose-Capillary 81 65 - 99 mg/dL  MRSA PCR Screening     Status: None   Collection Time: 03/27/17 12:17 PM  Result Value Ref Range   MRSA by PCR NEGATIVE NEGATIVE    Comment:        The GeneXpert MRSA Assay (FDA approved for NASAL specimens only), is one component of a comprehensive MRSA colonization surveillance program. It is not intended to diagnose MRSA infection nor to guide or monitor treatment for MRSA infections.   Glucose, capillary     Status: Abnormal   Collection Time: 03/27/17 12:39 PM  Result Value Ref Range   Glucose-Capillary 37 (LL) 65 - 99 mg/dL   Comment 1 Notify RN    Comment 2 Repeat Test    Comment 3 CHARGE CREDITED     Comment: Performed at [facility] [address] [place], [place] [address]  Glucose, capillary     Status: Abnormal   Collection Time: 03/27/17 12:41 PM  Result Value Ref Range   Glucose-Capillary 38 (LL) 65 - 99 mg/dL  Glucose, capillary     Status: None   Collection Time: 03/27/17  1:17 PM  Result Value Ref Range   Glucose-Capillary 72 65 - 99 mg/dL  Lactic acid, plasma     Status: None   Collection Time: 03/27/17  1:20 PM  Result Value Ref Range   Lactic Acid, Venous 1.8 0.5 - 1.9 mmol/L  CULTURE, BLOOD (ROUTINE X 2) w Reflex to ID Panel     Status: None (Preliminary result)   Collection Time: 03/27/17  1:51 PM  Result Value Ref Range   Specimen Description BLOOD RIGHT HAND    Special Requests      BOTTLES DRAWN AEROBIC AND ANAEROBIC Blood Culture adequate volume   Culture  Setup Time      GRAM POSITIVE COCCI AEROBIC BOTTLE ONLY CRITICAL RESULT CALLED TO, READ BACK BY AND VERIFIED WITH: LISA CLUTTS AT 1605 03/28/17. MSS    Culture NO GROWTH < 24 HOURS    Report Status PENDING   Blood Culture ID Panel (Reflexed)     Status: Abnormal   Collection Time: 03/27/17  1:51 PM  Result Value Ref Range   Enterococcus species NOT DETECTED NOT DETECTED   Listeria monocytogenes NOT DETECTED NOT DETECTED   Staphylococcus  species DETECTED (A) NOT DETECTED    Comment: Methicillin (oxacillin) susceptible coagulase negative staphylococcus. Possible blood culture contaminant (unless isolated from more than one blood culture draw or clinical case suggests pathogenicity). No antibiotic treatment is indicated for blood  culture contaminants. CRITICAL RESULT CALLED TO, READ BACK BY AND VERIFIED WITH: LISA CLUTTS AT 1605 03/28/17. MSS    Staphylococcus aureus NOT DETECTED NOT DETECTED   Methicillin resistance NOT DETECTED NOT DETECTED   Streptococcus species NOT DETECTED NOT DETECTED   Streptococcus agalactiae NOT DETECTED NOT DETECTED   Streptococcus pneumoniae NOT DETECTED NOT DETECTED   Streptococcus pyogenes NOT DETECTED NOT DETECTED   Acinetobacter baumannii NOT DETECTED NOT DETECTED   Enterobacteriaceae species NOT DETECTED NOT DETECTED   Enterobacter cloacae complex NOT DETECTED NOT DETECTED   Escherichia coli NOT DETECTED NOT DETECTED   Klebsiella oxytoca NOT DETECTED NOT DETECTED   Klebsiella pneumoniae NOT DETECTED NOT DETECTED   Proteus species NOT DETECTED NOT DETECTED   Serratia marcescens NOT DETECTED NOT DETECTED   Haemophilus influenzae NOT DETECTED NOT DETECTED   Neisseria meningitidis NOT DETECTED NOT DETECTED   Pseudomonas aeruginosa NOT DETECTED NOT DETECTED   Candida albicans NOT DETECTED NOT DETECTED   Candida glabrata NOT DETECTED NOT DETECTED   Candida krusei NOT DETECTED NOT DETECTED   Candida parapsilosis NOT DETECTED NOT DETECTED   Candida tropicalis NOT DETECTED NOT DETECTED  Glucose, capillary     Status: Abnormal   Collection Time: 03/27/17  2:06 PM  Result Value Ref Range   Glucose-Capillary 56 (L) 65 - 99 mg/dL  Glucose, capillary     Status: Abnormal   Collection Time: 03/27/17  2:08 PM  Result Value Ref Range   Glucose-Capillary 50 (L) 65 - 99 mg/dL  Glucose, capillary     Status: Abnormal   Collection Time: 03/27/17  2:28 PM  Result Value Ref Range   Glucose-Capillary  133 (H) 65 - 99 mg/dL  Urine Culture     Status: None   Collection Time: 03/27/17  2:41 PM  Result Value Ref Range   Specimen  Description URINE, RANDOM    Special Requests NONE    Culture      NO GROWTH Performed at [facility], [place] [address] [place], [place] [address]    Report Status 03/28/2017 FINAL   Urinalysis, Complete w Microscopic     Status: Abnormal   Collection Time: 03/27/17  2:41 PM  Result Value Ref Range   Color, Urine YELLOW (A) YELLOW   APPearance CLEAR (A) CLEAR   Specific Gravity, Urine 1.019 1.005 - 1.030   pH 5.0 5.0 - 8.0   Glucose, UA NEGATIVE NEGATIVE mg/dL   Hgb urine dipstick NEGATIVE NEGATIVE   Bilirubin Urine NEGATIVE NEGATIVE   Ketones, ur NEGATIVE NEGATIVE mg/dL   Protein, ur 30 (A) NEGATIVE mg/dL   Nitrite NEGATIVE NEGATIVE   Leukocytes, UA NEGATIVE NEGATIVE   RBC / HPF 0-5 0 - 5 RBC/hpf   WBC, UA 0-5 0 - 5 WBC/hpf   Bacteria, UA NONE SEEN NONE SEEN   Squamous Epithelial / LPF NONE SEEN NONE SEEN   Mucus PRESENT    Hyaline Casts, UA PRESENT   Glucose, capillary     Status: None   Collection Time: 03/27/17  3:06 PM  Result Value Ref Range   Glucose-Capillary 68 65 - 99 mg/dL  CULTURE, BLOOD (ROUTINE X 2) w Reflex to ID Panel     Status: None (Preliminary result)   Collection Time: 03/27/17  3:10 PM  Result Value Ref Range   Specimen Description BLOOD BLOOD RIGHT HAND    Special Requests      BOTTLES DRAWN AEROBIC AND ANAEROBIC Blood Culture adequate volume   Culture NO GROWTH < 24 HOURS    Report Status PENDING   Glucose, capillary     Status: None   Collection Time: 03/27/17  3:28 PM  Result Value Ref Range   Glucose-Capillary 73 65 - 99 mg/dL  Lactic acid, plasma     Status: None   Collection Time: 03/27/17  4:07 PM  Result Value Ref Range   Lactic Acid, Venous 1.3 0.5 - 1.9 mmol/L  Glucose, capillary     Status: None   Collection Time: 03/27/17  5:13 PM  Result Value Ref Range   Glucose-Capillary 83 65 - 99 mg/dL  HIV  antibody (Routine Testing)     Status: None   Collection Time: 03/27/17  6:00 PM  Result Value Ref Range   HIV Screen 4th Generation wRfx Non Reactive Non Reactive    Comment: (NOTE) Performed At: [facility] [place], [place] [id number] Lindon Romp MD GG:[id number]   Glucose, capillary     Status: None   Collection Time: 03/27/17  6:01 PM  Result Value Ref Range   Glucose-Capillary 89 65 - 99 mg/dL  Glucose, capillary     Status: None   Collection Time: 03/27/17  6:52 PM  Result Value Ref Range   Glucose-Capillary 94 65 - 99 mg/dL  Glucose, capillary     Status: None   Collection Time: 03/27/17  8:12 PM  Result Value Ref Range   Glucose-Capillary 97 65 - 99 mg/dL  Glucose, capillary     Status: None   Collection Time: 03/27/17  9:13 PM  Result Value Ref Range   Glucose-Capillary 98 65 - 99 mg/dL  Glucose, capillary     Status: None   Collection Time: 03/27/17 10:29 PM  Result Value Ref Range   Glucose-Capillary 94 65 - 99 mg/dL  Glucose, capillary  Status: None   Collection Time: 03/27/17 11:38 PM  Result Value Ref Range   Glucose-Capillary 94 65 - 99 mg/dL  Glucose, capillary     Status: Abnormal   Collection Time: 03/28/17  1:41 AM  Result Value Ref Range   Glucose-Capillary 105 (H) 65 - 99 mg/dL  Glucose, capillary     Status: None   Collection Time: 03/28/17  2:30 AM  Result Value Ref Range   Glucose-Capillary 85 65 - 99 mg/dL  Glucose, capillary     Status: None   Collection Time: 03/28/17  3:44 AM  Result Value Ref Range   Glucose-Capillary 75 65 - 99 mg/dL  Comprehensive metabolic panel     Status: Abnormal   Collection Time: 03/28/17  4:10 AM  Result Value Ref Range   Sodium 148 (H) 135 - 145 mmol/L   Potassium 4.1 3.5 - 5.1 mmol/L   Chloride 119 (H) 101 - 111 mmol/L   CO2 28 22 - 32 mmol/L   Glucose, Bld 91 65 - 99 mg/dL   BUN 12 6 - 20 mg/dL   Creatinine, Ser 1.12 (H) 0.44 - 1.00 mg/dL   Calcium 8.1 (L) 8.9 - 10.3 mg/dL    Total Protein 5.4 (L) 6.5 - 8.1 g/dL   Albumin 2.4 (L) 3.5 - 5.0 g/dL   AST 50 (H) 15 - 41 U/L   ALT 41 14 - 54 U/L   Alkaline Phosphatase 51 38 - 126 U/L   Total Bilirubin 0.3 0.3 - 1.2 mg/dL   GFR calc non Af Amer 53 (L) >60 mL/min   GFR calc Af Amer >60 >60 mL/min    Comment: (NOTE) The eGFR has been calculated using the CKD EPI equation. This calculation has not been validated in all clinical situations. eGFR's persistently <60 mL/min signify possible Chronic Kidney Disease.    Anion gap 1 (L) 5 - 15  Procalcitonin     Status: None   Collection Time: 03/28/17  4:10 AM  Result Value Ref Range   Procalcitonin <0.10 ng/mL    Comment:        Interpretation: PCT (Procalcitonin) <= 0.5 ng/mL: Systemic infection (sepsis) is not likely. Local bacterial infection is possible. (NOTE)         ICU PCT Algorithm               Non ICU PCT Algorithm    ----------------------------     ------------------------------         PCT < 0.25 ng/mL                 PCT < 0.1 ng/mL     Stopping of antibiotics            Stopping of antibiotics       strongly encouraged.               strongly encouraged.    ----------------------------     ------------------------------       PCT level decrease by               PCT < 0.25 ng/mL       >= 80% from peak PCT       OR PCT 0.25 - 0.5 ng/mL          Stopping of antibiotics  encouraged.     Stopping of antibiotics           encouraged.    ----------------------------     ------------------------------       PCT level decrease by              PCT >= 0.25 ng/mL       < 80% from peak PCT        AND PCT >= 0.5 ng/mL            Continuin g antibiotics                                              encouraged.       Continuing antibiotics            encouraged.    ----------------------------     ------------------------------     PCT level increase compared          PCT > 0.5 ng/mL         with peak PCT AND           PCT >= 0.5 ng/mL             Escalation of antibiotics                                          strongly encouraged.      Escalation of antibiotics        strongly encouraged.   CBC with Differential/Platelet     Status: Abnormal   Collection Time: 03/28/17  4:10 AM  Result Value Ref Range   WBC 5.6 3.6 - 11.0 K/uL   RBC 3.72 (L) 3.80 - 5.20 MIL/uL   Hemoglobin 10.5 (L) 12.0 - 16.0 g/dL   HCT 33.7 (L) 35.0 - 47.0 %   MCV 90.4 80.0 - 100.0 fL   MCH 28.2 26.0 - 34.0 pg   MCHC 31.1 (L) 32.0 - 36.0 g/dL   RDW 17.3 (H) 11.5 - 14.5 %   Platelets 101 (L) 150 - 440 K/uL   Neutrophils Relative % 72 %   Lymphocytes Relative 21 %   Monocytes Relative 6 %   Eosinophils Relative 0 %   Basophils Relative 1 %   Neutro Abs 4.0 1.4 - 6.5 K/uL   Lymphs Abs 1.2 1.0 - 3.6 K/uL   Monocytes Absolute 0.3 0.2 - 0.9 K/uL   Eosinophils Absolute 0.0 0 - 0.7 K/uL   Basophils Absolute 0.1 0 - 0.1 K/uL   Smear Review MORPHOLOGY UNREMARKABLE   Cortisol     Status: None   Collection Time: 03/28/17  4:10 AM  Result Value Ref Range   Cortisol, Plasma 9.3 ug/dL    Comment: (NOTE) AM    6.7 - 22.6 ug/dL PM   <10.0       ug/dL Performed at [facility], [address] [place], [place] [address]   Glucose, capillary     Status: None   Collection Time: 03/28/17  4:46 AM  Result Value Ref Range   Glucose-Capillary 71 65 - 99 mg/dL  Glucose, capillary     Status: None   Collection Time: 03/28/17  6:51 AM  Result Value Ref Range   Glucose-Capillary 97 65 - 99 mg/dL  Glucose, capillary     Status: None   Collection Time: 03/28/17  7:53 AM  Result Value Ref Range   Glucose-Capillary 97 65 - 99 mg/dL  Glucose, capillary     Status: None   Collection Time: 03/28/17  8:51 AM  Result Value Ref Range   Glucose-Capillary 82 65 - 99 mg/dL  Glucose, capillary     Status: Abnormal   Collection Time: 03/28/17 10:33 AM  Result Value Ref Range   Glucose-Capillary 103 (H) 65 - 99 mg/dL  Glucose,  capillary     Status: None   Collection Time: 03/28/17 11:09 AM  Result Value Ref Range   Glucose-Capillary 91 65 - 99 mg/dL  Glucose, capillary     Status: None   Collection Time: 03/28/17 12:56 PM  Result Value Ref Range   Glucose-Capillary 99 65 - 99 mg/dL  Glucose, capillary     Status: Abnormal   Collection Time: 03/28/17  1:58 PM  Result Value Ref Range   Glucose-Capillary 105 (H) 65 - 99 mg/dL  Glucose, capillary     Status: Abnormal   Collection Time: 03/28/17  3:02 PM  Result Value Ref Range   Glucose-Capillary 111 (H) 65 - 99 mg/dL  Glucose, capillary     Status: Abnormal   Collection Time: 03/28/17  4:03 PM  Result Value Ref Range   Glucose-Capillary 113 (H) 65 - 99 mg/dL   No components found for: ESR, C REACTIVE PROTEIN MICRO: Recent Results (from the past 720 hour(s))  MRSA PCR Screening     Status: None   Collection Time: 03/27/17 12:17 PM  Result Value Ref Range Status   MRSA by PCR NEGATIVE NEGATIVE Final    Comment:        The GeneXpert MRSA Assay (FDA approved for NASAL specimens only), is one component of a comprehensive MRSA colonization surveillance program. It is not intended to diagnose MRSA infection nor to guide or monitor treatment for MRSA infections.   CULTURE, BLOOD (ROUTINE X 2) w Reflex to ID Panel     Status: None (Preliminary result)   Collection Time: 03/27/17  1:51 PM  Result Value Ref Range Status   Specimen Description BLOOD RIGHT HAND  Final   Special Requests   Final    BOTTLES DRAWN AEROBIC AND ANAEROBIC Blood Culture adequate volume   Culture  Setup Time   Final    GRAM POSITIVE COCCI AEROBIC BOTTLE ONLY CRITICAL RESULT CALLED TO, READ BACK BY AND VERIFIED WITH: LISA CLUTTS AT 1605 03/28/17. MSS    Culture NO GROWTH < 24 HOURS  Final   Report Status PENDING  Incomplete  Blood Culture ID Panel (Reflexed)     Status: Abnormal   Collection Time: 03/27/17  1:51 PM  Result Value Ref Range Status   Enterococcus species NOT  DETECTED NOT DETECTED Final   Listeria monocytogenes NOT DETECTED NOT DETECTED Final   Staphylococcus species DETECTED (A) NOT DETECTED Final    Comment: Methicillin (oxacillin) susceptible coagulase negative staphylococcus. Possible blood culture contaminant (unless isolated from more than one blood culture draw or clinical case suggests pathogenicity). No antibiotic treatment is indicated for blood  culture contaminants. CRITICAL RESULT CALLED TO, READ BACK BY AND VERIFIED WITH: LISA CLUTTS AT 1605 03/28/17. MSS    Staphylococcus aureus NOT DETECTED NOT DETECTED Final   Methicillin resistance NOT DETECTED NOT DETECTED Final   Streptococcus species NOT DETECTED NOT DETECTED Final   Streptococcus agalactiae NOT DETECTED NOT DETECTED Final   Streptococcus  pneumoniae NOT DETECTED NOT DETECTED Final   Streptococcus pyogenes NOT DETECTED NOT DETECTED Final   Acinetobacter baumannii NOT DETECTED NOT DETECTED Final   Enterobacteriaceae species NOT DETECTED NOT DETECTED Final   Enterobacter cloacae complex NOT DETECTED NOT DETECTED Final   Escherichia coli NOT DETECTED NOT DETECTED Final   Klebsiella oxytoca NOT DETECTED NOT DETECTED Final   Klebsiella pneumoniae NOT DETECTED NOT DETECTED Final   Proteus species NOT DETECTED NOT DETECTED Final   Serratia marcescens NOT DETECTED NOT DETECTED Final   Haemophilus influenzae NOT DETECTED NOT DETECTED Final   Neisseria meningitidis NOT DETECTED NOT DETECTED Final   Pseudomonas aeruginosa NOT DETECTED NOT DETECTED Final   Candida albicans NOT DETECTED NOT DETECTED Final   Candida glabrata NOT DETECTED NOT DETECTED Final   Candida krusei NOT DETECTED NOT DETECTED Final   Candida parapsilosis NOT DETECTED NOT DETECTED Final   Candida tropicalis NOT DETECTED NOT DETECTED Final  Urine Culture     Status: None   Collection Time: 03/27/17  2:41 PM  Result Value Ref Range Status   Specimen Description URINE, RANDOM  Final   Special Requests NONE  Final    Culture   Final    NO GROWTH Performed at [facility], [place] [address] [place], [place] [address]    Report Status 03/28/2017 FINAL  Final  CULTURE, BLOOD (ROUTINE X 2) w Reflex to ID Panel     Status: None (Preliminary result)   Collection Time: 03/27/17  3:10 PM  Result Value Ref Range Status   Specimen Description BLOOD BLOOD RIGHT HAND  Final   Special Requests   Final    BOTTLES DRAWN AEROBIC AND ANAEROBIC Blood Culture adequate volume   Culture NO GROWTH < 24 HOURS  Final   Report Status PENDING  Incomplete    IMAGING: Dg Chest 2 View  Result Date: 03/24/2017 CLINICAL DATA:  Several recent falls EXAM: CHEST  2 VIEW COMPARISON:  November 25, 2016 FINDINGS: There is atelectatic change in the right base. Lungs elsewhere are clear. Heart is upper normal in size with pulmonary vascularity within normal limits. No pneumothorax. No bone lesions. IMPRESSION: Right lower lobe atelectatic change. No edema or consolidation. Stable cardiac silhouette. No pneumothorax. Electronically Signed   By: Lowella Grip III M.D.   On: 03/24/2017 12:39   Dg Pelvis 1-2 Views  Result Date: 03/24/2017 CLINICAL DATA:  Pain following recent falls EXAM: PELVIS - 1-2 VIEW COMPARISON:  None. FINDINGS: There is no evidence of pelvic fracture or dislocation. There is slight symmetric narrowing of both hip joints. Sacroiliac joints appear symmetric bilaterally. IMPRESSION: No acute fracture or dislocation. Mild symmetric narrowing of both hip joints. Electronically Signed   By: Lowella Grip III M.D.   On: 03/24/2017 12:40   Dg Abdomen 1 View  Result Date: 03/24/2017 CLINICAL DATA:  MRI clearance, poor historian EXAM: ABDOMEN - 1 VIEW COMPARISON:  03/24/2017 FINDINGS: Nonobstructed gas pattern. Calcified phleboliths in the left pelvis. No metallic density foreign bodies are seen. IMPRESSION: Negative. Electronically Signed   By: Donavan Foil M.D.   On: 03/24/2017 14:45   Ct Head Wo  Contrast  Result Date: 03/28/2017 CLINICAL DATA:  Altered mental status and hypothermia today. EXAM: CT HEAD WITHOUT CONTRAST TECHNIQUE: Contiguous axial images were obtained from the base of the skull through the vertex without intravenous contrast. COMPARISON:  Brain MRI and head CT scan 03/24/2017. FINDINGS: Brain: Appears normal without hemorrhage, infarct, mass lesion, mass effect, midline shift or  abnormal extra-axial fluid collection. No hydrocephalus or pneumocephalus. Vascular: No hyperdense vessel or unexpected calcification. Skull: Intact. Sinuses/Orbits: Negative. Other: None. IMPRESSION: Negative head CT. Electronically Signed   By: Inge Rise M.D.   On: 03/28/2017 14:47   Ct Head Wo Contrast  Result Date: 03/24/2017 CLINICAL DATA:  56 year old female with frequent falls over the past 2 weeks. Reason episode of altered mental status. Now baseline. Initial encounter. EXAM: CT HEAD WITHOUT CONTRAST TECHNIQUE: Contiguous axial images were obtained from the base of the skull through the vertex without intravenous contrast. COMPARISON:  11/25/2016 FINDINGS: Brain: Exam motion degraded. Images repeated. No obvious intracranial hemorrhage or CT evidence of large acute infarct. No intracranial mass lesion noted on this unenhanced exam. Vascular: No hyperdense vessel. Skull: No skull fracture. Hyperostosis frontalis interna incidentally noted. Sinuses/Orbits: Exophthalmos without acute orbital abnormality. Visualized paranasal sinuses are clear. Other: Minimal partial opacification right mastoid air cells. IMPRESSION: Motion degraded examination without obvious intracranial hemorrhage or acute infarct. No skull fracture. Minimal partial opacification right mastoid air cells. Electronically Signed   By: Genia Del M.D.   On: 03/24/2017 12:57   Mr Brain Wo Contrast  Result Date: 03/24/2017 CLINICAL DATA:  Recurrent falls. Underlying posttreatment disability on the CTs bite disorder, and  bipolar disorder. Neuro deficits, subacute. EXAM: MRI HEAD WITHOUT CONTRAST TECHNIQUE: Multiplanar, multiecho pulse sequences of the brain and surrounding structures were obtained without intravenous contrast. COMPARISON:  None. FINDINGS: Brain: The study is severely degraded by patient motion. The diffusion-weighted images are relatively immune to motion and demonstrate no acute or subacute infarction. No acute hemorrhage is present. There is no significant white matter disease. The brainstem and cerebellum are within normal limits. Ventricles are of normal size. No definite extra-axial fluid collection is present. Vascular: Flow is present in the major intracranial arteries. Skull and upper cervical spine: The skullbase is within normal limits. The craniocervical junction is unremarkable. Sinuses/Orbits: The paranasal sinuses and mastoid air cells are clear. Globes and orbits are unremarkable. IMPRESSION: 1. No acute intracranial abnormality.  Normal MRI the brain for age. 2. The study is severely degraded by patient motion, limiting evaluation for small lesions. Electronically Signed   By: San Morelle M.D.   On: 03/24/2017 15:18   Dg Chest Port 1 View  Result Date: 03/27/2017 CLINICAL DATA:  Central line placement EXAM: PORTABLE CHEST 1 VIEW COMPARISON:  03/24/2017 chest radiograph. FINDINGS: Right internal jugular central venous catheter terminates in the middle third of the superior vena cava. Stable cardiomediastinal silhouette with top-normal heart size. No pneumothorax. No pleural effusion. Stable hazy curvilinear opacities in the parahilar right lung. No acute consolidative airspace disease. IMPRESSION: 1. Right internal jugular central venous catheter terminates in the middle third of the superior vena cava. No pneumothorax. 2. Stable hazy curvilinear right parahilar lung opacities, favor scarring or atelectasis. Electronically Signed   By: Ilona Sorrel M.D.   On: 03/27/2017 13:50     Assessment:   Sarah Hurst is a 56 y.o. female with a history of schizoaffective disorder, GERD, sleep apnea admitted with hypoglycemia, hypotension and hypothermia. WBC normal, UA neg and procalcitonin normal. No obvious source of infection at this point.    Recommendations Would continue zosyn and monitor wbc, temperature and cultures Follow procalcitonin and if clinically improving and PC remains low can dc abx Thank you very much for allowing me to participate in the care of this patient. Please call with questions.   Cheral Marker. Ola Spurr, MD

## 2017-03-28 NOTE — Progress Notes (Signed)
Dr Belia HemanKasa in to evaluate patient. She has been non-verbal, does not follow any commands. Requested CT, he will review chart and make decision.

## 2017-03-28 NOTE — Progress Notes (Signed)
LCSW in ED received [facility] from [facility] Camille BalKim Oniel  [phone] assisted in locating a patient in our hospital and provided information that patient is no longer in ED and is ICU. In review of notes Hilbert OdorVanda Thomas was notified that patient was sent to ICU on the 25th by weekday LCSW.  [facility]  contact information. No further needs.  Delta Air LinesClaudine Mieko Kneebone LCSW [phone]

## 2017-03-28 NOTE — Progress Notes (Signed)
PHARMACY - PHYSICIAN COMMUNICATION CRITICAL VALUE ALERT - BLOOD CULTURE IDENTIFICATION (BCID)  Results for orders placed or performed during the hospital encounter of 03/24/17  Blood Culture ID Panel (Reflexed) (Collected: 03/27/2017  1:51 PM)  Result Value Ref Range   Enterococcus species NOT DETECTED NOT DETECTED   Listeria monocytogenes NOT DETECTED NOT DETECTED   Staphylococcus species DETECTED (A) NOT DETECTED   Staphylococcus aureus NOT DETECTED NOT DETECTED   Methicillin resistance NOT DETECTED NOT DETECTED   Streptococcus species NOT DETECTED NOT DETECTED   Streptococcus agalactiae NOT DETECTED NOT DETECTED   Streptococcus pneumoniae NOT DETECTED NOT DETECTED   Streptococcus pyogenes NOT DETECTED NOT DETECTED   Acinetobacter baumannii NOT DETECTED NOT DETECTED   Enterobacteriaceae species NOT DETECTED NOT DETECTED   Enterobacter cloacae complex NOT DETECTED NOT DETECTED   Escherichia coli NOT DETECTED NOT DETECTED   Klebsiella oxytoca NOT DETECTED NOT DETECTED   Klebsiella pneumoniae NOT DETECTED NOT DETECTED   Proteus species NOT DETECTED NOT DETECTED   Serratia marcescens NOT DETECTED NOT DETECTED   Haemophilus influenzae NOT DETECTED NOT DETECTED   Neisseria meningitidis NOT DETECTED NOT DETECTED   Pseudomonas aeruginosa NOT DETECTED NOT DETECTED   Candida albicans NOT DETECTED NOT DETECTED   Candida glabrata NOT DETECTED NOT DETECTED   Candida krusei NOT DETECTED NOT DETECTED   Candida parapsilosis NOT DETECTED NOT DETECTED   Candida tropicalis NOT DETECTED NOT DETECTED    Name of physician (or Provider) Contacted: Sonda Rumbleana Blakeney  Changes to prescribed antibiotics required: Continue with current therapy of Zosyn  Clovia CuffLisa Jaiyon Wander, PharmD, BCPS 03/28/2017 6:05 PM

## 2017-03-28 NOTE — Progress Notes (Deleted)
Interpreter came to sign with patient to translate today. Patient unable to focus and keep eyes open continuously to be able to see translator. Dana PA aware. Will keep patient in ICU for continued monitoring verses sending to the floor.  

## 2017-03-28 NOTE — Progress Notes (Signed)
[facility] [place] [facility]   PATIENT NAME: Sarah Hurst    MR#:  [id number]  DATE OF BIRTH:  1961-04-26  SUBJECTIVE:  CHIEF COMPLAINT:  Patient is encephalopathic  REVIEW OF SYSTEMS:  Patient is delirious  DRUG ALLERGIES:  No Known Allergies  VITALS:  Blood pressure 97/61, pulse 85, temperature (!) 97.3 F (36.3 C), resp. rate 16, height 5\' 2"  (1.575 m), weight (!) 143.8 kg (317 lb), SpO2 100 %.  PHYSICAL EXAMINATION:  GENERAL:  56 y.o.-year-old patient lying in the bed with no acute distress.  EYES: Pupils equal, round, reactive to light and accommodation. No scleral icterus. HEENT: Head atraumatic, normocephalic. Oropharynx and nasopharynx clear.  NECK:  Supple, no jugular venous distention. No thyroid enlargement, no tenderness.  LUNGS: Diminished breath sounds bilaterally, no wheezing, rales,rhonchi or crepitation. No use of accessory muscles of respiration.  CARDIOVASCULAR: S1, S2 normal. No murmurs, rubs, or gallops.  ABDOMEN: Soft, nontender, nondistended. Bowel sounds present. No organomegaly or mass.  EXTREMITIES: No pedal edema, cyanosis, or clubbing.  NEUROLOGIC: Encephalopathic  PSYCHIATRIC: The patient is disoriented  SKIN: No obvious rash, lesion, or ulcer.    LABORATORY PANEL:   CBC  Recent Labs Lab 03/28/17 0410  WBC 5.6  HGB 10.5*  HCT 33.7*  PLT 101*   ------------------------------------------------------------------------------------------------------------------  Chemistries   Recent Labs Lab 03/28/17 0410  NA 148*  K 4.1  CL 119*  CO2 28  GLUCOSE 91  BUN 12  CREATININE 1.12*  CALCIUM 8.1*  AST 50*  ALT 41  ALKPHOS 51  BILITOT 0.3   ------------------------------------------------------------------------------------------------------------------  Cardiac Enzymes  Recent Labs Lab 03/24/17 1258  TROPONINI <0.03    ------------------------------------------------------------------------------------------------------------------  RADIOLOGY:  Ct Head Wo Contrast  Result Date: 03/28/2017 CLINICAL DATA:  Altered mental status and hypothermia today. EXAM: CT HEAD WITHOUT CONTRAST TECHNIQUE: Contiguous axial images were obtained from the base of the skull through the vertex without intravenous contrast. COMPARISON:  Brain MRI and head CT scan 03/24/2017. FINDINGS: Brain: Appears normal without hemorrhage, infarct, mass lesion, mass effect, midline shift or abnormal extra-axial fluid collection. No hydrocephalus or pneumocephalus. Vascular: No hyperdense vessel or unexpected calcification. Skull: Intact. Sinuses/Orbits: Negative. Other: None. IMPRESSION: Negative head CT. Electronically Signed   By: Drusilla Kanner M.D.   On: 03/28/2017 14:47   Dg Chest Port 1 View  Result Date: 03/27/2017 CLINICAL DATA:  Central line placement EXAM: PORTABLE CHEST 1 VIEW COMPARISON:  03/24/2017 chest radiograph. FINDINGS: Right internal jugular central venous catheter terminates in the middle third of the superior vena cava. Stable cardiomediastinal silhouette with top-normal heart size. No pneumothorax. No pleural effusion. Stable hazy curvilinear opacities in the parahilar right lung. No acute consolidative airspace disease. IMPRESSION: 1. Right internal jugular central venous catheter terminates in the middle third of the superior vena cava. No pneumothorax. 2. Stable hazy curvilinear right parahilar lung opacities, favor scarring or atelectasis. Electronically Signed   By: Delbert Phenix M.D.   On: 03/27/2017 13:50    EKG:   Orders placed or performed during the hospital encounter of 03/24/17  . ED EKG  . ED EKG  . EKG 12-Lead  . EKG 12-Lead    ASSESSMENT AND PLAN:    # Acute encephalopathy with  Hypoglycemia ,hypothermia -septic shock    blood culture with Staphylococcus  Follow-up urine culture and  sensitivity Chest x-ray negative but possible aspiration-with perihilar opacities Empiric IV antibiotics Zosyn and vancomycin IV fluids with D10; aggressive hydration and supportive care Infectious  disease consult placed IV pressors CT head negative Medication normal, pro-calcitonin less than 0.10  #Hypoglycemia On IV fluids with D10  # Schizoaffective disorder currently seems to be  Catatonic Dr. Toni Amendclapacs is actively following  #Thrombocytopenia with no active bleeding could be from sepsis versus medication adverse effects Monitor platelet count closely    All the records are reviewed and case discussed with Care Management/Social Workerr. Management plans discussed with the ID  CODE STATUS: fc   TOTAL TIME TAKING CARE OF THIS PATIENT: 36  minutes.   POSSIBLE D/C IN ? DAYS, DEPENDING ON CLINICAL CONDITION.  Note: This dictation was prepared with Dragon dictation along with smaller phrase technology. Any transcriptional errors that result from this process are unintentional.   Ramonita LabGouru, Telma Pyeatt M.D on 03/28/2017 at 4:35 PM  Between 7am to 6pm - Pager - [phone] After 6pm go to [email or web address] - password [email or web address]  [facility]  Office  [phone]  CC: Primary care physician; System, Pcp Not In

## 2017-03-28 NOTE — Progress Notes (Signed)
Initial Nutrition Assessment  DOCUMENTATION CODES:   Morbid obesity  INTERVENTION:  Monitor for needs, GOC Per CCM re-assess for nutrition needs within 24 hours Patient may need tubefeeding,  Has been admitted since 10/22, obtunded, unable to meet needs via PO intake.  If tubefeeding is initiated, recommend begin trickle feeds of Vital High Protein at 3420mL/hr, Pro-stat 60mL TID given multiple pressors, monitor tolerance Provides 1080 calories, 132 grams of protein, 401mL free water  If tubefeeding initiated, Recommend D/C D10  If she tolerates feeding for 24 hrs and MAP >65, switch to Vital 1.2 at 4850mL/hr, Pro-stat 30mL TID provides 1740 calories, 135 grams of protein, 973mL free water  NUTRITION DIAGNOSIS:   Inadequate oral intake related to inability to eat (AMS) as evidenced by meal completion < 25%.  GOAL:   Patient will meet greater than or equal to 90% of their needs  MONITOR:   I & O's, Labs, Weight trends, Diet advancement  REASON FOR ASSESSMENT:   Rounds    ASSESSMENT:   56 yo female with PMH of OSA, Schizoaffective disorder, mental disability, GERD [facility], hypothermia, acute encephalopathy, and poor PO intake.  Currently obtunded. High risk for intubation, may be adrenal insufficiency per CCM.  Labs reviewed:  Na 148  Medications reviewed and include:  D10 1/2 NS 40K+ at 19200mL/hr --> 816 calories Levo gtt, Vaso gtt   Intake/Output Summary (Last 24 hours) at 03/28/17 1333 Last data filed at 03/28/17 1301  Gross per 24 hour  Intake          2580.86 ml  Output             1475 ml  Net          1105.86 ml  UOP 1125mL last 24 hours, 1.68L positive MAP: 73, 64, 67, 65, 77, 57   NUTRITION - FOCUSED PHYSICAL EXAM:    Most Recent Value  Orbital Region  No depletion  Upper Arm Region  No depletion  Thoracic and Lumbar Region  No depletion  Buccal Region  No depletion  Temple Region  No depletion  Clavicle Bone Region  No  depletion  Clavicle and Acromion Bone Region  No depletion  Scapular Bone Region  No depletion  Dorsal Hand  No depletion  Patellar Region  No depletion  Anterior Thigh Region  No depletion  Posterior Calf Region  No depletion  Edema (RD Assessment)  None  Hair  Other (Comment) [breaks easily, easily plucked]  Eyes  Reviewed  Mouth  Unable to assess  Skin  Reviewed  Nails  Reviewed       Diet Order:  Diet clear liquid Room service appropriate? Yes; Fluid consistency: Thin  EDUCATION NEEDS:   Not appropriate for education at this time  Skin:  Skin Assessment: Reviewed RN Assessment  Last BM:  PTA  Height:   Ht Readings from Last 1 Encounters:  03/24/17 5\' 2"  (1.575 m)    Weight:   Wt Readings from Last 1 Encounters:  03/24/17 (!) 317 lb (143.8 kg)    Ideal Body Weight:  50 kg  BMI:  Body mass index is 57.98 kg/m.  Estimated Nutritional Needs:   Kcal:  1500-1750 calories (IBW x30-35)  Protein:  >125 grams (2.5g/kg IBW)  Fluid:  >1.5L  Dionne AnoWilliam M. Navaya Wiatrek, MS, RD LDN Inpatient Clinical Dietitian Pager [phone]

## 2017-03-28 NOTE — Consult Note (Signed)
Name: Sarah Hurst MRN: [id number] DOB: [birth year]    ADMISSION DATE:  03/24/2017 CONSULTATION DATE: 03/27/2017  REFERRING MD : Dr. Amado Coe  CHIEF COMPLAINT: Hypoglycemia   BRIEF PATIENT DESCRIPTION: 56 yo female admitted 10/25 due to hypoglycemia, hypothermia and hypotension concerning for sepsis   SIGNIFICANT EVENTS  10/25-Pt admitted to ICU   STUDIES:  None   HISTORY OF PRESENT ILLNESS:   This is a 56 yo female with a PMH of OSA, Schizoaffective Disorder, Mental Disability, GERD, and Asthma.  She presented to [facility] ER 10/25 from a group home with hypoglycemia and poor po intake.  Per ER notes the pt was recently evaluated in the ER by neurology and psychiatry on 10/22 due to multiple falls and somnolence, MRI and CT head results negative at that time and symptoms thought to be psych related, however psychiatrist did not think pt needed to be admitted to behavioral unit at that time and could be discharged home.  This presentation in the ER pt found to be hypoglycemic requiring multiple doses of D50, hypothermic rectal temp 91.2 F, and nonverbal unable to follow commands.  She was subsequently admitted to ICU by hospitalist team for further workup and treatment PCCM consulted.    Patient remains obtunded Has sonorous respiration patient on very high doses of vasopressorss Patient is critically ill    REVIEW OF SYSTEMS:   Unable to assess pt nonverbal at this time.  SUBJECTIVE:  Patient on multiple vasopressors High risk for intubation Patient is critically ill Prognosis is guarded  VITAL SIGNS: Temp:  [91.2 F (32.9 C)-99.3 F (37.4 C)] 98.2 F (36.8 C) (10/26 0700) Pulse Rate:  [59-102] 96 (10/26 0610) Resp:  [9-25] 15 (10/26 0700) BP: (65-168)/(37-84) 112/58 (10/26 0700) SpO2:  [96 %-100 %] 100 % (10/26 0700)  PHYSICAL EXAMINATION: General: well developed, well nourished female, NAD  Neuro: nonverbal, not following commands, withdraws from pain, PERRL   HEENT:  supple, no JVD  Cardiovascular: nsr, s1s2, no M/R/G Lungs: rhonchi throughout, even, non labored  Abdomen: +BS x4, soft, obese, non distended  Musculoskeletal: normal bulk and tone, no edema  Skin: intact no rashes or lesions    Recent Labs Lab 03/26/17 1750 03/27/17 0817 03/28/17 0410  NA 148* 147* 148*  K 3.3* 3.4* 4.1  CL 117* 113* 119*  CO2 24 29 28   BUN 9 10 12   CREATININE 0.49 0.61 1.12*  GLUCOSE 74 85 91    Recent Labs Lab 03/26/17 1750 03/27/17 0817 03/28/17 0410  HGB 10.7* 11.3* 10.5*  HCT 34.2* 35.6 33.7*  WBC 4.0 3.6 5.6  PLT 104* 99* 101*   Dg Chest Port 1 View  Result Date: 03/27/2017 CLINICAL DATA:  Central line placement EXAM: PORTABLE CHEST 1 VIEW COMPARISON:  03/24/2017 chest radiograph. FINDINGS: Right internal jugular central venous catheter terminates in the middle third of the superior vena cava. Stable cardiomediastinal silhouette with top-normal heart size. No pneumothorax. No pleural effusion. Stable hazy curvilinear opacities in the parahilar right lung. No acute consolidative airspace disease. IMPRESSION: 1. Right internal jugular central venous catheter terminates in the middle third of the superior vena cava. No pneumothorax. 2. Stable hazy curvilinear right parahilar lung opacities, favor scarring or atelectasis. Electronically Signed   By: Delbert Phenix M.D.   On: 03/27/2017 13:50    ASSESSMENT / PLAN: Acute encephalopathy of unknown etiology -high risk for aspiration Possible sepsis although source unclear Hypotension secondary to possible sepsis vs. volume depletion  Hypoglycemia  Hypokalemia Hx: OSA and  Schizoaffective Disorder  P: Prn supplemental O2 to maintain O2 sats >92% or for dyspnea  Psychiatry consulted appreciate input Avoid sedating medications Frequent reorientation  Trend WBC and monitor fever curve Trend PCT and lactic acid Follow cultures Continue abx for now  Aggressive iv fluid resuscitation  Maintain map  >65 D10W1/2 NS with 40 meq KCL @75  ml/hr CBG's q1hr until 4 consecutive readings >90 Hold po outpatient medications until mentation improves Obtain neurology consult  Patient with severe septic shock on high doses of vasopressors will reevaluate antibiotic therapy and review microbiology will also consider stress dose steroids   Critical Care Time devoted to patient care services described in this note is 40 minutes.   Overall, patient is critically ill, prognosis is guarded.    Lucie LeatherKurian David Raaga Maeder, M.D.  [facility]  Medical Director [facility] Medical Director [facility] Card[facility] Department

## 2017-03-28 NOTE — Consult Note (Signed)
[facility] Consult   Reason for Consult:  Consult for 56 year old woman with schizophrenia currently on the intensive care unit with hypoglycemia Referring Physician:  Gouru Patient Identification: Sarah Hurst MRN:  [id number] Principal Diagnosis: Schizoaffective disorder, bipolar type [facility]) Diagnosis:   Patient Active Problem List   Diagnosis Date Noted  . Hypoglycemia [E16.2] 03/27/2017  . Schizoaffective disorder, bipolar type ([place]) [F25.0] 03/24/2017    Total Time spent with patient: 15 minutes  Subjective:   Sarah Hurst is a 56 y.o. female patient admitted with patient not able to give information.  Follow-up for Friday the 74th.  56 year old woman with a history of schizophrenia who is currently in the intensive care unit.  Patient appears to be extremely sick.  Requiring pressors and intravenous sugar just to maintain blood pressure and blood sugar.  Patient is unresponsive today.  HPI:  This is a 56 year old woman with a history of schizophrenia who came to the emergency room initially with medical complaints but then became more confused. Neurologic workup was negative. Patient has stopped eating and become less and less responsive. Currently the patient is in the intensive care unit and is not responding verbally to me. Hard to tell if she is listening or how awake she is. Patient had been continued on her oral antipsychotics until yesterday. She is normally also on long-acting injectable medicine but it is not known when the last dosage was given.  Social history: Patient has a legal guardian. Lives in a group home.  Medical history: Patient has diabetes. Overweight. Currently hypoglycemic not eating.  Substance abuse history: None known  Past Psychiatric History: Patient has a long history of schizophrenia multiple prior hospitalizations. Had been seen several times at the psychiatric facilities of our system recently. Often with physical complaints.  Usually was able to be sent back home to her group home without difficulty. Unknown if she has been suicidal in the past.  Risk to Self: Is patient at risk for suicide?: No Risk to Others:   Prior Inpatient Therapy:   Prior Outpatient Therapy:    Past Medical History:  Past Medical History:  Diagnosis Date  . Asthma   . GERD (gastroesophageal reflux disease)   . Mental disability   . Schizoaffective disorder, bipolar type ([place])   . Sleep apnea     Past Surgical History:  Procedure Laterality Date  . WRIST SURGERY     Family History: No family history on file. Family Psychiatric  History: Unknown Social History:  History  Alcohol Use No     History  Drug Use No    Social History   Social History  . Marital status: Single    Spouse name: N/A  . Number of children: N/A  . Years of education: N/A   Social History Main Topics  . Smoking status: Former Research scientist (life sciences)  . Smokeless tobacco: Never Used  . Alcohol use No  . Drug use: No  . Sexual activity: Not Asked   Other Topics Concern  . None   Social History Narrative  . None   Additional Social History:    Allergies:  No Known Allergies  Labs:  Results for orders placed or performed during the hospital encounter of 03/24/17 (from the past 48 hour(s))  Glucose, capillary     Status: None   Collection Time: 03/26/17  5:20 PM  Result Value Ref Range   Glucose-Capillary 67 65 - 99 mg/dL  CBC     Status: Abnormal   Collection Time:  03/26/17  5:50 PM  Result Value Ref Range   WBC 4.0 3.6 - 11.0 K/uL   RBC 3.78 (L) 3.80 - 5.20 MIL/uL   Hemoglobin 10.7 (L) 12.0 - 16.0 g/dL   HCT 34.2 (L) 35.0 - 47.0 %   MCV 90.4 80.0 - 100.0 fL   MCH 28.3 26.0 - 34.0 pg   MCHC 31.3 (L) 32.0 - 36.0 g/dL   RDW 16.7 (H) 11.5 - 14.5 %   Platelets 104 (L) 150 - 440 K/uL  Comprehensive metabolic panel     Status: Abnormal   Collection Time: 03/26/17  5:50 PM  Result Value Ref Range   Sodium 148 (H) 135 - 145 mmol/L   Potassium 3.3  (L) 3.5 - 5.1 mmol/L   Chloride 117 (H) 101 - 111 mmol/L   CO2 24 22 - 32 mmol/L   Glucose, Bld 74 65 - 99 mg/dL   BUN 9 6 - 20 mg/dL   Creatinine, Ser 0.49 0.44 - 1.00 mg/dL   Calcium 8.0 (L) 8.9 - 10.3 mg/dL   Total Protein 5.2 (L) 6.5 - 8.1 g/dL   Albumin 2.4 (L) 3.5 - 5.0 g/dL   AST 64 (H) 15 - 41 U/L   ALT 42 14 - 54 U/L   Alkaline Phosphatase 44 38 - 126 U/L   Total Bilirubin 0.6 0.3 - 1.2 mg/dL   GFR calc non Af Amer >60 >60 mL/min   GFR calc Af Amer >60 >60 mL/min    Comment: (NOTE) The eGFR has been calculated using the CKD EPI equation. This calculation has not been validated in all clinical situations. eGFR's persistently <60 mL/min signify possible Chronic Kidney Disease.    Anion gap 7 5 - 15  Glucose, capillary     Status: Abnormal   Collection Time: 03/26/17  8:16 PM  Result Value Ref Range   Glucose-Capillary 61 (L) 65 - 99 mg/dL  Glucose, capillary     Status: None   Collection Time: 03/26/17 10:52 PM  Result Value Ref Range   Glucose-Capillary 84 65 - 99 mg/dL  Glucose, capillary     Status: Abnormal   Collection Time: 03/27/17  3:57 AM  Result Value Ref Range   Glucose-Capillary 45 (L) 65 - 99 mg/dL  Glucose, capillary     Status: None   Collection Time: 03/27/17  5:10 AM  Result Value Ref Range   Glucose-Capillary 71 65 - 99 mg/dL  Glucose, capillary     Status: Abnormal   Collection Time: 03/27/17  6:09 AM  Result Value Ref Range   Glucose-Capillary 43 (LL) 65 - 99 mg/dL   Comment 1 Call MD NNP PA CNM    Comment 2 REPEATED TO VERIFY, CHARGE CREDITED     Comment: Performed at [facility], [address], [place], [place] [address]  Glucose, capillary     Status: Abnormal   Collection Time: 03/27/17  6:11 AM  Result Value Ref Range   Glucose-Capillary 48 (L) 65 - 99 mg/dL  Glucose, capillary     Status: Abnormal   Collection Time: 03/27/17  7:15 AM  Result Value Ref Range   Glucose-Capillary 128 (H) 65 - 99 mg/dL  CBC     Status: Abnormal    Collection Time: 03/27/17  8:17 AM  Result Value Ref Range   WBC 3.6 3.6 - 11.0 K/uL   RBC 3.95 3.80 - 5.20 MIL/uL   Hemoglobin 11.3 (L) 12.0 - 16.0 g/dL   HCT 35.6 35.0 -  47.0 %   MCV 90.3 80.0 - 100.0 fL   MCH 28.5 26.0 - 34.0 pg   MCHC 31.6 (L) 32.0 - 36.0 g/dL   RDW 16.8 (H) 11.5 - 14.5 %   Platelets 99 (L) 150 - 440 K/uL  Comprehensive metabolic panel     Status: Abnormal   Collection Time: 03/27/17  8:17 AM  Result Value Ref Range   Sodium 147 (H) 135 - 145 mmol/L   Potassium 3.4 (L) 3.5 - 5.1 mmol/L   Chloride 113 (H) 101 - 111 mmol/L   CO2 29 22 - 32 mmol/L   Glucose, Bld 85 65 - 99 mg/dL   BUN 10 6 - 20 mg/dL   Creatinine, Ser 0.61 0.44 - 1.00 mg/dL   Calcium 8.9 8.9 - 10.3 mg/dL   Total Protein 5.8 (L) 6.5 - 8.1 g/dL   Albumin 2.6 (L) 3.5 - 5.0 g/dL   AST 61 (H) 15 - 41 U/L   ALT 47 14 - 54 U/L   Alkaline Phosphatase 60 38 - 126 U/L   Total Bilirubin 0.5 0.3 - 1.2 mg/dL   GFR calc non Af Amer >60 >60 mL/min   GFR calc Af Amer >60 >60 mL/min    Comment: (NOTE) The eGFR has been calculated using the CKD EPI equation. This calculation has not been validated in all clinical situations. eGFR's persistently <60 mL/min signify possible Chronic Kidney Disease.    Anion gap 5 5 - 15  TSH     Status: None   Collection Time: 03/27/17  8:17 AM  Result Value Ref Range   TSH 3.525 0.350 - 4.500 uIU/mL    Comment: Performed by a 3rd Generation assay with a functional sensitivity of <=0.01 uIU/mL.  Cortisol     Status: None   Collection Time: 03/27/17  8:17 AM  Result Value Ref Range   Cortisol, Plasma 6.5 ug/dL    Comment: (NOTE) AM    6.7 - 22.6 ug/dL PM   <10.0       ug/dL Performed at [facility] [address] [place], [place] [address]   Procalcitonin - Baseline     Status: None   Collection Time: 03/27/17  8:17 AM  Result Value Ref Range   Procalcitonin <0.10 ng/mL    Comment:        Interpretation: PCT (Procalcitonin) <= 0.5 ng/mL: Systemic  infection (sepsis) is not likely. Local bacterial infection is possible. (NOTE)         ICU PCT Algorithm               Non ICU PCT Algorithm    ----------------------------     ------------------------------         PCT < 0.25 ng/mL                 PCT < 0.1 ng/mL     Stopping of antibiotics            Stopping of antibiotics       strongly encouraged.               strongly encouraged.    ----------------------------     ------------------------------       PCT level decrease by               PCT < 0.25 ng/mL       >= 80% from peak PCT       OR PCT 0.25 - 0.5 ng/mL  Stopping of antibiotics                                             encouraged.     Stopping of antibiotics           encouraged.    ----------------------------     ------------------------------       PCT level decrease by              PCT >= 0.25 ng/mL       < 80% from peak PCT        AND PCT >= 0.5 ng/mL            Continuin g antibiotics                                              encouraged.       Continuing antibiotics            encouraged.    ----------------------------     ------------------------------     PCT level increase compared          PCT > 0.5 ng/mL         with peak PCT AND          PCT >= 0.5 ng/mL             Escalation of antibiotics                                          strongly encouraged.      Escalation of antibiotics        strongly encouraged.   Glucose, capillary     Status: Abnormal   Collection Time: 03/27/17  8:48 AM  Result Value Ref Range   Glucose-Capillary 63 (L) 65 - 99 mg/dL  Glucose, capillary     Status: None   Collection Time: 03/27/17  9:29 AM  Result Value Ref Range   Glucose-Capillary 76 65 - 99 mg/dL   Comment 1 Notify RN    Comment 2 Document in Chart   Glucose, capillary     Status: None   Collection Time: 03/27/17 10:12 AM  Result Value Ref Range   Glucose-Capillary 73 65 - 99 mg/dL  Glucose, capillary     Status: Abnormal   Collection Time:  03/27/17 10:54 AM  Result Value Ref Range   Glucose-Capillary 64 (L) 65 - 99 mg/dL  Glucose, capillary     Status: None   Collection Time: 03/27/17 11:38 AM  Result Value Ref Range   Glucose-Capillary 81 65 - 99 mg/dL  MRSA PCR Screening     Status: None   Collection Time: 03/27/17 12:17 PM  Result Value Ref Range   MRSA by PCR NEGATIVE NEGATIVE    Comment:        The GeneXpert MRSA Assay (FDA approved for NASAL specimens only), is one component of a comprehensive MRSA colonization surveillance program. It is not intended to diagnose MRSA infection nor to guide or monitor treatment for MRSA infections.   Glucose, capillary     Status: Abnormal   Collection Time: 03/27/17 12:39 PM  Result Value Ref Range  Glucose-Capillary 37 (LL) 65 - 99 mg/dL   Comment 1 Notify RN    Comment 2 Repeat Test    Comment 3 CHARGE CREDITED     Comment: Performed at [facility], [place] [address], [place], [place] [address]  Glucose, capillary     Status: Abnormal   Collection Time: 03/27/17 12:41 PM  Result Value Ref Range   Glucose-Capillary 38 (LL) 65 - 99 mg/dL  Glucose, capillary     Status: None   Collection Time: 03/27/17  1:17 PM  Result Value Ref Range   Glucose-Capillary 72 65 - 99 mg/dL  Lactic acid, plasma     Status: None   Collection Time: 03/27/17  1:20 PM  Result Value Ref Range   Lactic Acid, Venous 1.8 0.5 - 1.9 mmol/L  CULTURE, BLOOD (ROUTINE X 2) w Reflex to ID Panel     Status: None (Preliminary result)   Collection Time: 03/27/17  1:51 PM  Result Value Ref Range   Specimen Description BLOOD RIGHT HAND    Special Requests      BOTTLES DRAWN AEROBIC AND ANAEROBIC Blood Culture adequate volume   Culture  Setup Time      GRAM POSITIVE COCCI AEROBIC BOTTLE ONLY CRITICAL RESULT CALLED TO, READ BACK BY AND VERIFIED WITH: LISA CLUTTS AT 1605 03/28/17. MSS    Culture NO GROWTH < 24 HOURS    Report Status PENDING   Blood Culture ID Panel (Reflexed)     Status:  Abnormal   Collection Time: 03/27/17  1:51 PM  Result Value Ref Range   Enterococcus species NOT DETECTED NOT DETECTED   Listeria monocytogenes NOT DETECTED NOT DETECTED   Staphylococcus species DETECTED (A) NOT DETECTED    Comment: Methicillin (oxacillin) susceptible coagulase negative staphylococcus. Possible blood culture contaminant (unless isolated from more than one blood culture draw or clinical case suggests pathogenicity). No antibiotic treatment is indicated for blood  culture contaminants. CRITICAL RESULT CALLED TO, READ BACK BY AND VERIFIED WITH: LISA CLUTTS AT 1605 03/28/17. MSS    Staphylococcus aureus NOT DETECTED NOT DETECTED   Methicillin resistance NOT DETECTED NOT DETECTED   Streptococcus species NOT DETECTED NOT DETECTED   Streptococcus agalactiae NOT DETECTED NOT DETECTED   Streptococcus pneumoniae NOT DETECTED NOT DETECTED   Streptococcus pyogenes NOT DETECTED NOT DETECTED   Acinetobacter baumannii NOT DETECTED NOT DETECTED   Enterobacteriaceae species NOT DETECTED NOT DETECTED   Enterobacter cloacae complex NOT DETECTED NOT DETECTED   Escherichia coli NOT DETECTED NOT DETECTED   Klebsiella oxytoca NOT DETECTED NOT DETECTED   Klebsiella pneumoniae NOT DETECTED NOT DETECTED   Proteus species NOT DETECTED NOT DETECTED   Serratia marcescens NOT DETECTED NOT DETECTED   Haemophilus influenzae NOT DETECTED NOT DETECTED   Neisseria meningitidis NOT DETECTED NOT DETECTED   Pseudomonas aeruginosa NOT DETECTED NOT DETECTED   Candida albicans NOT DETECTED NOT DETECTED   Candida glabrata NOT DETECTED NOT DETECTED   Candida krusei NOT DETECTED NOT DETECTED   Candida parapsilosis NOT DETECTED NOT DETECTED   Candida tropicalis NOT DETECTED NOT DETECTED  Glucose, capillary     Status: Abnormal   Collection Time: 03/27/17  2:06 PM  Result Value Ref Range   Glucose-Capillary 56 (L) 65 - 99 mg/dL  Glucose, capillary     Status: Abnormal   Collection Time: 03/27/17  2:08 PM   Result Value Ref Range   Glucose-Capillary 50 (L) 65 - 99 mg/dL  Glucose, capillary     Status: Abnormal  Collection Time: 03/27/17  2:28 PM  Result Value Ref Range   Glucose-Capillary 133 (H) 65 - 99 mg/dL  Urine Culture     Status: None   Collection Time: 03/27/17  2:41 PM  Result Value Ref Range   Specimen Description URINE, RANDOM    Special Requests NONE    Culture      NO GROWTH Performed at [facility], [place] [address] [place], [place] [address]    Report Status 03/28/2017 FINAL   Urinalysis, Complete w Microscopic     Status: Abnormal   Collection Time: 03/27/17  2:41 PM  Result Value Ref Range   Color, Urine YELLOW (A) YELLOW   APPearance CLEAR (A) CLEAR   Specific Gravity, Urine 1.019 1.005 - 1.030   pH 5.0 5.0 - 8.0   Glucose, UA NEGATIVE NEGATIVE mg/dL   Hgb urine dipstick NEGATIVE NEGATIVE   Bilirubin Urine NEGATIVE NEGATIVE   Ketones, ur NEGATIVE NEGATIVE mg/dL   Protein, ur 30 (A) NEGATIVE mg/dL   Nitrite NEGATIVE NEGATIVE   Leukocytes, UA NEGATIVE NEGATIVE   RBC / HPF 0-5 0 - 5 RBC/hpf   WBC, UA 0-5 0 - 5 WBC/hpf   Bacteria, UA NONE SEEN NONE SEEN   Squamous Epithelial / LPF NONE SEEN NONE SEEN   Mucus PRESENT    Hyaline Casts, UA PRESENT   Glucose, capillary     Status: None   Collection Time: 03/27/17  3:06 PM  Result Value Ref Range   Glucose-Capillary 68 65 - 99 mg/dL  CULTURE, BLOOD (ROUTINE X 2) w Reflex to ID Panel     Status: None (Preliminary result)   Collection Time: 03/27/17  3:10 PM  Result Value Ref Range   Specimen Description BLOOD BLOOD RIGHT HAND    Special Requests      BOTTLES DRAWN AEROBIC AND ANAEROBIC Blood Culture adequate volume   Culture NO GROWTH < 24 HOURS    Report Status PENDING   Glucose, capillary     Status: None   Collection Time: 03/27/17  3:28 PM  Result Value Ref Range   Glucose-Capillary 73 65 - 99 mg/dL  Lactic acid, plasma     Status: None   Collection Time: 03/27/17  4:07 PM  Result Value Ref  Range   Lactic Acid, Venous 1.3 0.5 - 1.9 mmol/L  Glucose, capillary     Status: None   Collection Time: 03/27/17  5:13 PM  Result Value Ref Range   Glucose-Capillary 83 65 - 99 mg/dL  HIV antibody (Routine Testing)     Status: None   Collection Time: 03/27/17  6:00 PM  Result Value Ref Range   HIV Screen 4th Generation wRfx Non Reactive Non Reactive    Comment: (NOTE) Performed At: [facility] [address] [place], [place] [id number] Lindon Romp MD [id number]   Glucose, capillary     Status: None   Collection Time: 03/27/17  6:01 PM  Result Value Ref Range   Glucose-Capillary 89 65 - 99 mg/dL  Glucose, capillary     Status: None   Collection Time: 03/27/17  6:52 PM  Result Value Ref Range   Glucose-Capillary 94 65 - 99 mg/dL  Glucose, capillary     Status: None   Collection Time: 03/27/17  8:12 PM  Result Value Ref Range   Glucose-Capillary 97 65 - 99 mg/dL  Glucose, capillary     Status: None   Collection Time: 03/27/17  9:13 PM  Result Value Ref  Range   Glucose-Capillary 98 65 - 99 mg/dL  Glucose, capillary     Status: None   Collection Time: 03/27/17 10:29 PM  Result Value Ref Range   Glucose-Capillary 94 65 - 99 mg/dL  Glucose, capillary     Status: None   Collection Time: 03/27/17 11:38 PM  Result Value Ref Range   Glucose-Capillary 94 65 - 99 mg/dL  Glucose, capillary     Status: Abnormal   Collection Time: 03/28/17  1:41 AM  Result Value Ref Range   Glucose-Capillary 105 (H) 65 - 99 mg/dL  Glucose, capillary     Status: None   Collection Time: 03/28/17  2:30 AM  Result Value Ref Range   Glucose-Capillary 85 65 - 99 mg/dL  Glucose, capillary     Status: None   Collection Time: 03/28/17  3:44 AM  Result Value Ref Range   Glucose-Capillary 75 65 - 99 mg/dL  Comprehensive metabolic panel     Status: Abnormal   Collection Time: 03/28/17  4:10 AM  Result Value Ref Range   Sodium 148 (H) 135 - 145 mmol/L   Potassium 4.1 3.5 - 5.1 mmol/L    Chloride 119 (H) 101 - 111 mmol/L   CO2 28 22 - 32 mmol/L   Glucose, Bld 91 65 - 99 mg/dL   BUN 12 6 - 20 mg/dL   Creatinine, Ser 1.12 (H) 0.44 - 1.00 mg/dL   Calcium 8.1 (L) 8.9 - 10.3 mg/dL   Total Protein 5.4 (L) 6.5 - 8.1 g/dL   Albumin 2.4 (L) 3.5 - 5.0 g/dL   AST 50 (H) 15 - 41 U/L   ALT 41 14 - 54 U/L   Alkaline Phosphatase 51 38 - 126 U/L   Total Bilirubin 0.3 0.3 - 1.2 mg/dL   GFR calc non Af Amer 53 (L) >60 mL/min   GFR calc Af Amer >60 >60 mL/min    Comment: (NOTE) The eGFR has been calculated using the CKD EPI equation. This calculation has not been validated in all clinical situations. eGFR's persistently <60 mL/min signify possible Chronic Kidney Disease.    Anion gap 1 (L) 5 - 15  Procalcitonin     Status: None   Collection Time: 03/28/17  4:10 AM  Result Value Ref Range   Procalcitonin <0.10 ng/mL    Comment:        Interpretation: PCT (Procalcitonin) <= 0.5 ng/mL: Systemic infection (sepsis) is not likely. Local bacterial infection is possible. (NOTE)         ICU PCT Algorithm               Non ICU PCT Algorithm    ----------------------------     ------------------------------         PCT < 0.25 ng/mL                 PCT < 0.1 ng/mL     Stopping of antibiotics            Stopping of antibiotics       strongly encouraged.               strongly encouraged.    ----------------------------     ------------------------------       PCT level decrease by               PCT < 0.25 ng/mL       >= 80% from peak PCT       OR PCT 0.25 - 0.5 ng/mL  Stopping of antibiotics                                             encouraged.     Stopping of antibiotics           encouraged.    ----------------------------     ------------------------------       PCT level decrease by              PCT >= 0.25 ng/mL       < 80% from peak PCT        AND PCT >= 0.5 ng/mL            Continuin g antibiotics                                              encouraged.        Continuing antibiotics            encouraged.    ----------------------------     ------------------------------     PCT level increase compared          PCT > 0.5 ng/mL         with peak PCT AND          PCT >= 0.5 ng/mL             Escalation of antibiotics                                          strongly encouraged.      Escalation of antibiotics        strongly encouraged.   CBC with Differential/Platelet     Status: Abnormal   Collection Time: 03/28/17  4:10 AM  Result Value Ref Range   WBC 5.6 3.6 - 11.0 K/uL   RBC 3.72 (L) 3.80 - 5.20 MIL/uL   Hemoglobin 10.5 (L) 12.0 - 16.0 g/dL   HCT 33.7 (L) 35.0 - 47.0 %   MCV 90.4 80.0 - 100.0 fL   MCH 28.2 26.0 - 34.0 pg   MCHC 31.1 (L) 32.0 - 36.0 g/dL   RDW 17.3 (H) 11.5 - 14.5 %   Platelets 101 (L) 150 - 440 K/uL   Neutrophils Relative % 72 %   Lymphocytes Relative 21 %   Monocytes Relative 6 %   Eosinophils Relative 0 %   Basophils Relative 1 %   Neutro Abs 4.0 1.4 - 6.5 K/uL   Lymphs Abs 1.2 1.0 - 3.6 K/uL   Monocytes Absolute 0.3 0.2 - 0.9 K/uL   Eosinophils Absolute 0.0 0 - 0.7 K/uL   Basophils Absolute 0.1 0 - 0.1 K/uL   Smear Review MORPHOLOGY UNREMARKABLE   Cortisol     Status: None   Collection Time: 03/28/17  4:10 AM  Result Value Ref Range   Cortisol, Plasma 9.3 ug/dL    Comment: (NOTE) AM    6.7 - 22.6 ug/dL PM   <10.0       ug/dL Performed at [facility], [address] [place], [place] [address]   Glucose, capillary     Status: None   Collection Time: 03/28/17  4:46 AM  Result Value Ref Range   Glucose-Capillary 71 65 - 99 mg/dL  Glucose, capillary     Status: None   Collection Time: 03/28/17  6:51 AM  Result Value Ref Range   Glucose-Capillary 97 65 - 99 mg/dL  Glucose, capillary     Status: None   Collection Time: 03/28/17  7:53 AM  Result Value Ref Range   Glucose-Capillary 97 65 - 99 mg/dL  Glucose, capillary     Status: None   Collection Time: 03/28/17  8:51 AM  Result Value Ref Range    Glucose-Capillary 82 65 - 99 mg/dL  Glucose, capillary     Status: Abnormal   Collection Time: 03/28/17 10:33 AM  Result Value Ref Range   Glucose-Capillary 103 (H) 65 - 99 mg/dL  Glucose, capillary     Status: None   Collection Time: 03/28/17 11:09 AM  Result Value Ref Range   Glucose-Capillary 91 65 - 99 mg/dL  Glucose, capillary     Status: None   Collection Time: 03/28/17 12:56 PM  Result Value Ref Range   Glucose-Capillary 99 65 - 99 mg/dL  Glucose, capillary     Status: Abnormal   Collection Time: 03/28/17  1:58 PM  Result Value Ref Range   Glucose-Capillary 105 (H) 65 - 99 mg/dL  Glucose, capillary     Status: Abnormal   Collection Time: 03/28/17  3:02 PM  Result Value Ref Range   Glucose-Capillary 111 (H) 65 - 99 mg/dL  Glucose, capillary     Status: Abnormal   Collection Time: 03/28/17  4:03 PM  Result Value Ref Range   Glucose-Capillary 113 (H) 65 - 99 mg/dL    Current Facility-Administered Medications  Medication Dose Route Frequency Provider Last Rate Last Dose  . 0.9 %  sodium chloride infusion   Intravenous Continuous Varughese, Bincy S, NP   Stopped at 03/28/17 0453  . acetaminophen (TYLENOL) tablet 650 mg  650 mg Oral Q6H PRN Gouru, Aruna, MD       Or  . acetaminophen (TYLENOL) suppository 650 mg  650 mg Rectal Q6H PRN Gouru, Aruna, MD      . chlorhexidine gluconate (MEDLINE KIT) (PERIDEX) 0.12 % solution 15 mL  15 mL Mouth Rinse BID Awilda Bill, NP   15 mL at 03/28/17 0815  . dextrose 10 % 1,000 mL with sodium chloride 0.45 %, potassium chloride 40 mEq/L infusion   Intravenous Continuous Awilda Bill, NP 100 mL/hr at 03/28/17 0618    . dextrose 50 % solution 50 mL  1 ampule Intravenous Once Sung, Jade J, MD      . dextrose 50 % solution 50 mL  1 ampule Intravenous Once Awilda Bill, NP      . enoxaparin (LOVENOX) injection 40 mg  40 mg Subcutaneous Q24H Flora Lipps, MD   40 mg at 03/28/17 1613  . hydrocortisone sodium succinate (SOLU-CORTEF) 100  MG injection 50 mg  50 mg Intravenous Q6H Gouru, Aruna, MD   50 mg at 03/28/17 1614  . MEDLINE mouth rinse  15 mL Mouth Rinse QID Awilda Bill, NP   15 mL at 03/28/17 1619  . norepinephrine (LEVOPHED) 16 mg in dextrose 5 % 250 mL (0.064 mg/mL) infusion  0-40 mcg/min Intravenous Titrated Gouru, Aruna, MD 14.1 mL/hr at 03/28/17 1620 15 mcg/min at 03/28/17 1620  . phenylephrine (NEO-SYNEPHRINE) 40 mg in sodium chloride 0.9 % 250 mL (0.16 mg/mL) infusion  0-400 mcg/min Intravenous Titrated Varughese, Azucena Cecil, NP  Stopped at 03/28/17 1259  . piperacillin-tazobactam (ZOSYN) IVPB 3.375 g  3.375 g Intravenous Q8H Hallaji, Sheema M, RPH 12.5 mL/hr at 03/28/17 1301 3.375 g at 03/28/17 1301  . vasopressin (PITRESSIN) 40 Units in sodium chloride 0.9 % 250 mL (0.16 Units/mL) infusion  0.03 Units/min Intravenous Continuous Kasa, Kurian, MD 11.3 mL/hr at 03/28/17 1259 0.03 Units/min at 03/28/17 1259    Musculoskeletal: Strength & Muscle Tone: decreased Gait & Station: unable to stand Patient leans: N/A  Psychiatric Specialty Exam: Physical Exam  Nursing note and vitals reviewed. Constitutional: She appears well-developed and well-nourished.  HENT:  Head: Normocephalic and atraumatic.  Eyes: Pupils are equal, round, and reactive to light. Conjunctivae are normal.  Neck: Normal range of motion.  Cardiovascular: Normal heart sounds.   Respiratory: Effort normal.  GI: Soft.  Musculoskeletal: Normal range of motion.  Skin: Skin is warm and dry.  Psychiatric: Her affect is blunt. She is noncommunicative.    Review of Systems  Unable to perform ROS: Patient unresponsive    Blood pressure 97/61, pulse 85, temperature (!) 97.3 F (36.3 C), resp. rate 16, height _0  (1.575 m), weight (!) 317 lb (143.8 kg), SpO2 100 %.Body mass index is 57.98 kg/m.  General Appearance: Disheveled  Eye Contact:  None  Speech:  Negative  Volume:  Decreased  Mood:  Negative  Affect:  Negative  Thought Process:  NA   Orientation:  Negative  Thought Content:  Negative  Suicidal Thoughts:  No  Homicidal Thoughts:  No  Memory:  Negative  Judgement:  Negative  Insight:  Negative  Psychomotor Activity:  Negative  Concentration:  Concentration: Negative  Recall:  Negative  Fund of Knowledge:  Negative  Language:  Negative  Akathisia:  Negative  Handed:  Right  AIMS (if indicated):     Assets:  Social Support  ADL's:  Impaired  Cognition:  Impaired,  Severe  Sleep:        Treatment Plan Summary: Daily contact with patient to assess and evaluate symptoms and progress in treatment, Medication management and Plan 56 year old woman with schizophrenia. Not responsive. No specific medical issue other than now her hypoglycemia. Neurologic workup had previously been negative. Behavior is most likely in large part related to mental health issues. 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 Normal treatment for catatonia would involve benzodiazepines but given her very low blood sugars and blood pressure right now I am a little has been to add extra sedation in the form of benzodiazepines. I am going to replace her antipsychotics aggressively with Zyprexa 10 mg intramuscular twice a day. I will continue to follow-up regularly.  Disposition: No evidence of imminent risk to self or others at present.   Patient does not meet criteria for psychiatric inpatient admission. Supportive therapy provided about ongoing stressors.  Alethia Berthold, MD 03/28/2017 5:13 PM

## 2017-03-28 NOTE — Progress Notes (Signed)
PT Cancellation Note  Patient Details Name: Sarah CapeDeborah Hurst MRN: [id number] DOB: [birth year]   Cancelled Treatment:    Reason Eval/Treat Not Completed: Patient not medically ready Spoke with nursing again today who states that "PT is a no go right now."  We will complete PT orders at this time, it pt becomes appropriate for PT please re-consult.  Will sign-off.  Malachi ProGalen R Thomasene Dubow, DPT 03/28/2017, 11:28 AM

## 2017-03-29 ENCOUNTER — Inpatient Hospital Stay (HOSPITAL_COMMUNITY)
Admit: 2017-03-29 | Discharge: 2017-03-29 | Disposition: A | Discharge status: Home / Self Care | Payer: Medicaid Other | Attending: Pulmonary Disease | Admitting: Pulmonary Disease

## 2017-03-29 DIAGNOSIS — I5030 Unspecified diastolic (congestive) heart failure: Secondary | ICD-10-CM

## 2017-03-29 DIAGNOSIS — R579 Shock, unspecified: Secondary | ICD-10-CM

## 2017-03-29 LAB — ECHOCARDIOGRAM COMPLETE
Area-P 1/2: 2.75 cm2
CHL CUP MV DEC (S): 275
E decel time: 275 msec
E/e' ratio: 15.13
FS: 31 % (ref 28–44)
HEIGHTINCHES: 62 in
IVS/LV PW RATIO, ED: 0.89
LA diam end sys: 35 mm
LA diam index: 1.34 cm/m2
LA vol A4C: 51.7 ml
LASIZE: 35 mm
LV PW d: 10.6 mm — AB (ref 0.6–1.1)
LVEEAVG: 15.13
LVEEMED: 15.13
MV Peak grad: 3 mmHg
MV pk A vel: 114 m/s
MVPKEVEL: 88.8 m/s
MVSPHT: 80 ms
TDI e' medial: 5.87
WEIGHTICAEL: 5072 [oz_av]

## 2017-03-29 LAB — CBC
HEMATOCRIT: 32.4 % — AB (ref 35.0–47.0)
HEMOGLOBIN: 10.4 g/dL — AB (ref 12.0–16.0)
MCH: 29.2 pg (ref 26.0–34.0)
MCHC: 32.1 g/dL (ref 32.0–36.0)
MCV: 91 fL (ref 80.0–100.0)
Platelets: 80 10*3/uL — ABNORMAL LOW (ref 150–440)
RBC: 3.56 MIL/uL — ABNORMAL LOW (ref 3.80–5.20)
RDW: 17.3 % — AB (ref 11.5–14.5)
WBC: 6.5 10*3/uL (ref 3.6–11.0)

## 2017-03-29 LAB — BASIC METABOLIC PANEL
ANION GAP: 6 (ref 5–15)
BUN: 12 mg/dL (ref 6–20)
CHLORIDE: 113 mmol/L — AB (ref 101–111)
CO2: 27 mmol/L (ref 22–32)
Calcium: 8.4 mg/dL — ABNORMAL LOW (ref 8.9–10.3)
Creatinine, Ser: 0.8 mg/dL (ref 0.44–1.00)
GFR calc non Af Amer: >60 mL/min (ref >60)
GLUCOSE: 163 mg/dL — AB (ref 65–99)
POTASSIUM: 4.3 mmol/L (ref 3.5–5.1)
Sodium: 146 mmol/L — ABNORMAL HIGH (ref 135–145)

## 2017-03-29 LAB — GLUCOSE, CAPILLARY
GLUCOSE-CAPILLARY: 151 mg/dL — AB (ref 65–99)
GLUCOSE-CAPILLARY: 91 mg/dL (ref 65–99)
Glucose-Capillary: 158 mg/dL — ABNORMAL HIGH (ref 65–99)
Glucose-Capillary: 159 mg/dL — ABNORMAL HIGH (ref 65–99)
Glucose-Capillary: 101 mg/dL — ABNORMAL HIGH (ref 65–99)

## 2017-03-29 LAB — PHOSPHORUS: PHOSPHORUS: 2.3 mg/dL — AB (ref 2.5–4.6)

## 2017-03-29 LAB — MAGNESIUM: Magnesium: 1.4 mg/dL — ABNORMAL LOW (ref 1.7–2.4)

## 2017-03-29 LAB — PROCALCITONIN

## 2017-03-29 MED ORDER — SODIUM CHLORIDE 4 MEQ/ML IV SOLN
INTRAVENOUS | Status: DC
  Filled 2017-03-29: qty 1000

## 2017-03-29 NOTE — Consult Note (Signed)
Name: Sarah Hurst MRN: [id number] DOB: July 13, 1960    ADMISSION DATE:  03/24/2017 CONSULTATION DATE: 03/27/2017  REFERRING MD : Dr. Amado Coe  CHIEF COMPLAINT: Hypoglycemia   BRIEF PATIENT DESCRIPTION: 56 yo female admitted 10/25 due to hypoglycemia, hypothermia and hypotension concerning for sepsis   SIGNIFICANT EVENTS  10/25-Pt admitted to ICU   STUDIES:  None   HISTORY OF PRESENT ILLNESS:   This is a 56 yo female with a PMH of OSA, Schizoaffective Disorder, Mental Disability, GERD, and Asthma.  She presented to [facility] ER 10/25 from a group home with hypoglycemia and poor po intake.  Per ER notes the pt was recently evaluated in the ER by neurology and psychiatry on 10/22 due to multiple falls and somnolence, MRI and CT head results negative at that time and symptoms thought to be psych related, however psychiatrist did not think pt needed to be admitted to behavioral unit at that time and could be discharged home.  This presentation in the ER pt found to be hypoglycemic requiring multiple doses of D50, hypothermic rectal temp 91.2 F, and nonverbal unable to follow commands.  She was subsequently admitted to ICU by hospitalist team for further workup and treatment PCCM consulted.    Patient remains lethargic arousal to vocal stimuli On vasopressin only    REVIEW OF SYSTEMS:   Unable to assess pt nonverbal at this time.  SUBJECTIVE:  Patient on  vasopressors High risk for intubation Patient is critically ill Prognosis is guarded  VITAL SIGNS: Temp:  [96.6 F (35.9 C)-98.6 F (37 C)] 98.2 F (36.8 C) (10/27 0600) Pulse Rate:  [65-92] 68 (10/27 0600) Resp:  [0-24] 24 (10/27 0600) BP: (80-128)/(39-82) 128/77 (10/27 0600) SpO2:  [91 %-100 %] 99 % (10/27 0600)  PHYSICAL EXAMINATION: General: well developed, well nourished female, NAD  Neuro: nonverbal, not following commands, withdraws from pain, PERRL   HEENT: supple, no JVD  Cardiovascular: nsr, s1s2, no M/R/G Lungs:  rhonchi throughout, even, non labored  Abdomen: +BS x4, soft, obese, non distended  Musculoskeletal: normal bulk and tone, no edema  Skin: intact no rashes or lesions    Recent Labs Lab 03/27/17 0817 03/28/17 0410 03/29/17 0355  NA 147* 148* 146*  K 3.4* 4.1 4.3  CL 113* 119* 113*  CO2 29 28 27   BUN 10 12 12   CREATININE 0.61 1.12* 0.80  GLUCOSE 85 91 163*    Recent Labs Lab 03/27/17 0817 03/28/17 0410 03/29/17 0355  HGB 11.3* 10.5* 10.4*  HCT 35.6 33.7* 32.4*  WBC 3.6 5.6 6.5  PLT 99* 101* 80*   ASSESSMENT / PLAN: Acute encephalopathy of unknown etiology -high risk for aspiration Possible sepsis although source unclear Hypotension secondary to possible sepsis vs. volume depletion  Hypoglycemia  Hypokalemia Hx: OSA and Schizoaffective Disorder  P: Prn supplemental O2 to maintain O2 sats >92% or for dyspnea  Psychiatry consulted appreciate input Avoid sedating medications Frequent reorientation  Trend WBC and monitor fever curve Trend PCT and lactic acid Follow cultures Continue abx for now  Aggressive iv fluid resuscitation  Maintain map >65 D10W1/2 NS with 40 meq KCL @75  ml/hr CBG's q1hr until 4 consecutive readings >90 Hold po outpatient medications until mentation improves neurology consult follow up   Patient with severe septic shock on  vasopressors will reevaluate antibiotic therapy and review microbiology will continue  stress dose steroids   Critical Care Time devoted to patient care services described in this note is 32 minutes.   Overall, patient is critically  ill, prognosis is guarded.    Lucie LeatherKurian David Tremont Gavitt, M.D.  [facility]  Medical Director [facility] Medical Director [facility]

## 2017-03-29 NOTE — Progress Notes (Signed)
Subjective:  Slightly more awake and opens eyes but does not track.    Objective: Current vital signs: BP 115/73   Pulse 61   Temp (!) 97.2 F (36.2 C)   Resp 17   Ht _0  (1.575 m)   Wt (!) 143.8 kg (317 lb)   SpO2 99%   BMI 57.98 kg/m  Vital signs in last 24 hours: Temp:  [96.6 F (35.9 C)-98.6 F (37 C)] 97.2 F (36.2 C) (10/27 1100) Pulse Rate:  [61-92] 61 (10/27 0900) Resp:  [0-24] 17 (10/27 1100) BP: (80-141)/(39-89) 115/73 (10/27 1100) SpO2:  [95 %-100 %] 99 % (10/27 0900)  Intake/Output from previous day: 10/26 0701 - 10/27 0700 In: 1457.2 [I.V.:1357.2; IV Piggyback:100] Out: 1450 [Urine:1450] Intake/Output this shift: No intake/output data recorded. Nutritional status:    Neurologic Exam:  Neurological Examination   Mental Status: Opens eyes but does not track Cranial Nerves: II: Discs flat bilaterally III,IV, VI: ptosis not present, extra-ocular motions intact bilaterally V,VII: unable to access  VIII: hearing normal bilaterally IX,X: gag reflex present XI: not tested   Motor: Able to withdraw from painful stimuli  Tone and bulk:normal tone throughout; no atrophy noted      Lab Results: Results for orders placed or performed during the hospital encounter of 03/24/17 (from the past 48 hour(s))  Glucose, capillary     Status: None   Collection Time: 03/27/17 11:38 AM  Result Value Ref Range   Glucose-Capillary 81 65 - 99 mg/dL  MRSA PCR Screening     Status: None   Collection Time: 03/27/17 12:17 PM  Result Value Ref Range   MRSA by PCR NEGATIVE NEGATIVE    Comment:        The GeneXpert MRSA Assay (FDA approved for NASAL specimens only), is one component of a comprehensive MRSA colonization surveillance program. It is not intended to diagnose MRSA infection nor to guide or monitor treatment for MRSA infections.   Glucose, capillary     Status: Abnormal   Collection Time: 03/27/17 12:39 PM  Result Value Ref Range   Glucose-Capillary 37 (LL) 65 - 99 mg/dL   Comment 1 Notify RN    Comment 2 Repeat Test    Comment 3 CHARGE CREDITED     Comment: Performed at [facility] [address] [place], [place] [address]  Glucose, capillary     Status: Abnormal   Collection Time: 03/27/17 12:41 PM  Result Value Ref Range   Glucose-Capillary 38 (LL) 65 - 99 mg/dL  Glucose, capillary     Status: None   Collection Time: 03/27/17  1:17 PM  Result Value Ref Range   Glucose-Capillary 72 65 - 99 mg/dL  Lactic acid, plasma     Status: None   Collection Time: 03/27/17  1:20 PM  Result Value Ref Range   Lactic Acid, Venous 1.8 0.5 - 1.9 mmol/L  CULTURE, BLOOD (ROUTINE X 2) w Reflex to ID Panel     Status: None (Preliminary result)   Collection Time: 03/27/17  1:51 PM  Result Value Ref Range   Specimen Description BLOOD RIGHT HAND    Special Requests      BOTTLES DRAWN AEROBIC AND ANAEROBIC Blood Culture adequate volume   Culture  Setup Time      GRAM POSITIVE COCCI AEROBIC BOTTLE ONLY CRITICAL RESULT CALLED TO, READ BACK BY AND VERIFIED WITH: LISA CLUTTS AT 1605 03/28/17. MSS    Culture GRAM POSITIVE COCCI    Report Status PENDING  Blood Culture ID Panel (Reflexed)     Status: Abnormal   Collection Time: 03/27/17  1:51 PM  Result Value Ref Range   Enterococcus species NOT DETECTED NOT DETECTED   Listeria monocytogenes NOT DETECTED NOT DETECTED   Staphylococcus species DETECTED (A) NOT DETECTED    Comment: Methicillin (oxacillin) susceptible coagulase negative staphylococcus. Possible blood culture contaminant (unless isolated from more than one blood culture draw or clinical case suggests pathogenicity). No antibiotic treatment is indicated for blood  culture contaminants. CRITICAL RESULT CALLED TO, READ BACK BY AND VERIFIED WITH: LISA CLUTTS AT 1605 03/28/17. MSS    Staphylococcus aureus NOT DETECTED NOT DETECTED   Methicillin resistance NOT DETECTED NOT DETECTED   Streptococcus species NOT  DETECTED NOT DETECTED   Streptococcus agalactiae NOT DETECTED NOT DETECTED   Streptococcus pneumoniae NOT DETECTED NOT DETECTED   Streptococcus pyogenes NOT DETECTED NOT DETECTED   Acinetobacter baumannii NOT DETECTED NOT DETECTED   Enterobacteriaceae species NOT DETECTED NOT DETECTED   Enterobacter cloacae complex NOT DETECTED NOT DETECTED   Escherichia coli NOT DETECTED NOT DETECTED   Klebsiella oxytoca NOT DETECTED NOT DETECTED   Klebsiella pneumoniae NOT DETECTED NOT DETECTED   Proteus species NOT DETECTED NOT DETECTED   Serratia marcescens NOT DETECTED NOT DETECTED   Haemophilus influenzae NOT DETECTED NOT DETECTED   Neisseria meningitidis NOT DETECTED NOT DETECTED   Pseudomonas aeruginosa NOT DETECTED NOT DETECTED   Candida albicans NOT DETECTED NOT DETECTED   Candida glabrata NOT DETECTED NOT DETECTED   Candida krusei NOT DETECTED NOT DETECTED   Candida parapsilosis NOT DETECTED NOT DETECTED   Candida tropicalis NOT DETECTED NOT DETECTED  Glucose, capillary     Status: Abnormal   Collection Time: 03/27/17  2:06 PM  Result Value Ref Range   Glucose-Capillary 56 (L) 65 - 99 mg/dL  Glucose, capillary     Status: Abnormal   Collection Time: 03/27/17  2:08 PM  Result Value Ref Range   Glucose-Capillary 50 (L) 65 - 99 mg/dL  Glucose, capillary     Status: Abnormal   Collection Time: 03/27/17  2:28 PM  Result Value Ref Range   Glucose-Capillary 133 (H) 65 - 99 mg/dL  Urine Culture     Status: None   Collection Time: 03/27/17  2:41 PM  Result Value Ref Range   Specimen Description URINE, RANDOM    Special Requests NONE    Culture      NO GROWTH Performed at [facility], [address] [place],  [address]    Report Status 03/28/2017 FINAL   Urinalysis, Complete w Microscopic     Status: Abnormal   Collection Time: 03/27/17  2:41 PM  Result Value Ref Range   Color, Urine YELLOW (A) YELLOW   APPearance CLEAR (A) CLEAR   Specific Gravity, Urine 1.019 1.005 -  1.030   pH 5.0 5.0 - 8.0   Glucose, UA NEGATIVE NEGATIVE mg/dL   Hgb urine dipstick NEGATIVE NEGATIVE   Bilirubin Urine NEGATIVE NEGATIVE   Ketones, ur NEGATIVE NEGATIVE mg/dL   Protein, ur 30 (A) NEGATIVE mg/dL   Nitrite NEGATIVE NEGATIVE   Leukocytes, UA NEGATIVE NEGATIVE   RBC / HPF 0-5 0 - 5 RBC/hpf   WBC, UA 0-5 0 - 5 WBC/hpf   Bacteria, UA NONE SEEN NONE SEEN   Squamous Epithelial / LPF NONE SEEN NONE SEEN   Mucus PRESENT    Hyaline Casts, UA PRESENT   Glucose, capillary     Status: None  Collection Time: 03/27/17  3:06 PM  Result Value Ref Range   Glucose-Capillary 68 65 - 99 mg/dL  CULTURE, BLOOD (ROUTINE X 2) w Reflex to ID Panel     Status: None (Preliminary result)   Collection Time: 03/27/17  3:10 PM  Result Value Ref Range   Specimen Description BLOOD BLOOD RIGHT HAND    Special Requests      BOTTLES DRAWN AEROBIC AND ANAEROBIC Blood Culture adequate volume   Culture NO GROWTH 2 DAYS    Report Status PENDING   Glucose, capillary     Status: None   Collection Time: 03/27/17  3:28 PM  Result Value Ref Range   Glucose-Capillary 73 65 - 99 mg/dL  Lactic acid, plasma     Status: None   Collection Time: 03/27/17  4:07 PM  Result Value Ref Range   Lactic Acid, Venous 1.3 0.5 - 1.9 mmol/L  Glucose, capillary     Status: None   Collection Time: 03/27/17  5:13 PM  Result Value Ref Range   Glucose-Capillary 83 65 - 99 mg/dL  HIV antibody (Routine Testing)     Status: None   Collection Time: 03/27/17  6:00 PM  Result Value Ref Range   HIV Screen 4th Generation wRfx Non Reactive Non Reactive    Comment: (NOTE) Performed At: [facility] [address] [place], [place] [id number] Lindon Romp MD WU:[id number]   Glucose, capillary     Status: None   Collection Time: 03/27/17  6:01 PM  Result Value Ref Range   Glucose-Capillary 89 65 - 99 mg/dL  Glucose, capillary     Status: None   Collection Time: 03/27/17  6:52 PM  Result Value Ref Range    Glucose-Capillary 94 65 - 99 mg/dL  Glucose, capillary     Status: None   Collection Time: 03/27/17  8:12 PM  Result Value Ref Range   Glucose-Capillary 97 65 - 99 mg/dL  Glucose, capillary     Status: None   Collection Time: 03/27/17  9:13 PM  Result Value Ref Range   Glucose-Capillary 98 65 - 99 mg/dL  Glucose, capillary     Status: None   Collection Time: 03/27/17 10:29 PM  Result Value Ref Range   Glucose-Capillary 94 65 - 99 mg/dL  Glucose, capillary     Status: None   Collection Time: 03/27/17 11:38 PM  Result Value Ref Range   Glucose-Capillary 94 65 - 99 mg/dL  Glucose, capillary     Status: Abnormal   Collection Time: 03/28/17  1:41 AM  Result Value Ref Range   Glucose-Capillary 105 (H) 65 - 99 mg/dL  Glucose, capillary     Status: None   Collection Time: 03/28/17  2:30 AM  Result Value Ref Range   Glucose-Capillary 85 65 - 99 mg/dL  Glucose, capillary     Status: None   Collection Time: 03/28/17  3:44 AM  Result Value Ref Range   Glucose-Capillary 75 65 - 99 mg/dL  Comprehensive metabolic panel     Status: Abnormal   Collection Time: 03/28/17  4:10 AM  Result Value Ref Range   Sodium 148 (H) 135 - 145 mmol/L   Potassium 4.1 3.5 - 5.1 mmol/L   Chloride 119 (H) 101 - 111 mmol/L   CO2 28 22 - 32 mmol/L   Glucose, Bld 91 65 - 99 mg/dL   BUN 12 6 - 20 mg/dL   Creatinine, Ser 1.12 (H) 0.44 - 1.00 mg/dL   Calcium 8.1 (  L) 8.9 - 10.3 mg/dL   Total Protein 5.4 (L) 6.5 - 8.1 g/dL   Albumin 2.4 (L) 3.5 - 5.0 g/dL   AST 50 (H) 15 - 41 U/L   ALT 41 14 - 54 U/L   Alkaline Phosphatase 51 38 - 126 U/L   Total Bilirubin 0.3 0.3 - 1.2 mg/dL   GFR calc non Af Amer 53 (L) >60 mL/min   GFR calc Af Amer >60 >60 mL/min    Comment: (NOTE) The eGFR has been calculated using the CKD EPI equation. This calculation has not been validated in all clinical situations. eGFR's persistently <60 mL/min signify possible Chronic Kidney Disease.    Anion gap 1 (L) 5 - 15  Procalcitonin      Status: None   Collection Time: 03/28/17  4:10 AM  Result Value Ref Range   Procalcitonin <0.10 ng/mL    Comment:        Interpretation: PCT (Procalcitonin) <= 0.5 ng/mL: Systemic infection (sepsis) is not likely. Local bacterial infection is possible. (NOTE)         ICU PCT Algorithm               Non ICU PCT Algorithm    ----------------------------     ------------------------------         PCT < 0.25 ng/mL                 PCT < 0.1 ng/mL     Stopping of antibiotics            Stopping of antibiotics       strongly encouraged.               strongly encouraged.    ----------------------------     ------------------------------       PCT level decrease by               PCT < 0.25 ng/mL       >= 80% from peak PCT       OR PCT 0.25 - 0.5 ng/mL          Stopping of antibiotics                                             encouraged.     Stopping of antibiotics           encouraged.    ----------------------------     ------------------------------       PCT level decrease by              PCT >= 0.25 ng/mL       < 80% from peak PCT        AND PCT >= 0.5 ng/mL            Continuin g antibiotics                                              encouraged.       Continuing antibiotics            encouraged.    ----------------------------     ------------------------------     PCT level increase compared          PCT > 0.5 ng/mL  with peak PCT AND          PCT >= 0.5 ng/mL             Escalation of antibiotics                                          strongly encouraged.      Escalation of antibiotics        strongly encouraged.   CBC with Differential/Platelet     Status: Abnormal   Collection Time: 03/28/17  4:10 AM  Result Value Ref Range   WBC 5.6 3.6 - 11.0 K/uL   RBC 3.72 (L) 3.80 - 5.20 MIL/uL   Hemoglobin 10.5 (L) 12.0 - 16.0 g/dL   HCT 33.7 (L) 35.0 - 47.0 %   MCV 90.4 80.0 - 100.0 fL   MCH 28.2 26.0 - 34.0 pg   MCHC 31.1 (L) 32.0 - 36.0 g/dL   RDW 17.3 (H) 11.5  - 14.5 %   Platelets 101 (L) 150 - 440 K/uL   Neutrophils Relative % 72 %   Lymphocytes Relative 21 %   Monocytes Relative 6 %   Eosinophils Relative 0 %   Basophils Relative 1 %   Neutro Abs 4.0 1.4 - 6.5 K/uL   Lymphs Abs 1.2 1.0 - 3.6 K/uL   Monocytes Absolute 0.3 0.2 - 0.9 K/uL   Eosinophils Absolute 0.0 0 - 0.7 K/uL   Basophils Absolute 0.1 0 - 0.1 K/uL   Smear Review MORPHOLOGY UNREMARKABLE   Cortisol     Status: None   Collection Time: 03/28/17  4:10 AM  Result Value Ref Range   Cortisol, Plasma 9.3 ug/dL    Comment: (NOTE) AM    6.7 - 22.6 ug/dL PM   <10.0       ug/dL Performed at [facility], [address], [place], [place] [address]   Glucose, capillary     Status: None   Collection Time: 03/28/17  4:46 AM  Result Value Ref Range   Glucose-Capillary 71 65 - 99 mg/dL  Glucose, capillary     Status: None   Collection Time: 03/28/17  6:51 AM  Result Value Ref Range   Glucose-Capillary 97 65 - 99 mg/dL  Glucose, capillary     Status: None   Collection Time: 03/28/17  7:53 AM  Result Value Ref Range   Glucose-Capillary 97 65 - 99 mg/dL  Glucose, capillary     Status: None   Collection Time: 03/28/17  8:51 AM  Result Value Ref Range   Glucose-Capillary 82 65 - 99 mg/dL  Glucose, capillary     Status: Abnormal   Collection Time: 03/28/17 10:33 AM  Result Value Ref Range   Glucose-Capillary 103 (H) 65 - 99 mg/dL  Glucose, capillary     Status: None   Collection Time: 03/28/17 11:09 AM  Result Value Ref Range   Glucose-Capillary 91 65 - 99 mg/dL  Glucose, capillary     Status: None   Collection Time: 03/28/17 12:56 PM  Result Value Ref Range   Glucose-Capillary 99 65 - 99 mg/dL  Glucose, capillary     Status: Abnormal   Collection Time: 03/28/17  1:58 PM  Result Value Ref Range   Glucose-Capillary 105 (H) 65 - 99 mg/dL  Glucose, capillary     Status: Abnormal   Collection Time: 03/28/17  3:02 PM  Result Value Ref Range   Glucose-Capillary 111 (H) 65  - 99 mg/dL  Glucose, capillary     Status: Abnormal   Collection Time: 03/28/17  4:03 PM  Result Value Ref Range   Glucose-Capillary 113 (H) 65 - 99 mg/dL  Urine Drug Screen, Qualitative ([facility] only)     Status: None   Collection Time: 03/28/17  4:28 PM  Result Value Ref Range   Tricyclic, Ur Screen NONE DETECTED NONE DETECTED   Amphetamines, Ur Screen NONE DETECTED NONE DETECTED   MDMA (Ecstasy)Ur Screen NONE DETECTED NONE DETECTED   Cocaine Metabolite,Ur [place] NONE DETECTED NONE DETECTED   Opiate, Ur Screen NONE DETECTED NONE DETECTED   Phencyclidine (PCP) Ur S NONE DETECTED NONE DETECTED   Cannabinoid 50 Ng, Ur [place] NONE DETECTED NONE DETECTED   Barbiturates, Ur Screen NONE DETECTED NONE DETECTED   Benzodiazepine, Ur Scrn NONE DETECTED NONE DETECTED   Methadone Scn, Ur NONE DETECTED NONE DETECTED    Comment: (NOTE) 390  Tricyclics, urine               Cutoff 1000 ng/mL 200  Amphetamines, urine             Cutoff 1000 ng/mL 300  MDMA (Ecstasy), urine           Cutoff 500 ng/mL 400  Cocaine Metabolite, urine       Cutoff 300 ng/mL 500  Opiate, urine                   Cutoff 300 ng/mL 600  Phencyclidine (PCP), urine      Cutoff 25 ng/mL 700  Cannabinoid, urine              Cutoff 50 ng/mL 800  Barbiturates, urine             Cutoff 200 ng/mL 900  Benzodiazepine, urine           Cutoff 200 ng/mL 1000 Methadone, urine                Cutoff 300 ng/mL 1100 1200 The urine drug screen provides only a preliminary, unconfirmed 1300 analytical test result and should not be used for non-medical 1400 purposes. Clinical consideration and professional judgment should 1500 be applied to any positive drug screen result due to possible 1600 interfering substances. A more specific alternate chemical method 1700 must be used in order to obtain a confirmed analytical result.  1800 Gas chromato graphy / mass spectrometry (GC/MS) is the preferred 1900 confirmatory method.   Glucose, capillary      Status: Abnormal   Collection Time: 03/28/17  5:15 PM  Result Value Ref Range   Glucose-Capillary 106 (H) 65 - 99 mg/dL  Glucose, capillary     Status: Abnormal   Collection Time: 03/28/17  6:26 PM  Result Value Ref Range   Glucose-Capillary 126 (H) 65 - 99 mg/dL  Glucose, capillary     Status: Abnormal   Collection Time: 03/28/17  7:32 PM  Result Value Ref Range   Glucose-Capillary 118 (H) 65 - 99 mg/dL  Glucose, capillary     Status: Abnormal   Collection Time: 03/29/17 12:57 AM  Result Value Ref Range   Glucose-Capillary 158 (H) 65 - 99 mg/dL  Glucose, capillary     Status: Abnormal   Collection Time: 03/29/17  3:48 AM  Result Value Ref Range   Glucose-Capillary 159 (H) 65 - 99 mg/dL  Procalcitonin     Status: None  Collection Time: 03/29/17  3:55 AM  Result Value Ref Range   Procalcitonin <0.10 ng/mL    Comment:        Interpretation: PCT (Procalcitonin) <= 0.5 ng/mL: Systemic infection (sepsis) is not likely. Local bacterial infection is possible. (NOTE)         ICU PCT Algorithm               Non ICU PCT Algorithm    ----------------------------     ------------------------------         PCT < 0.25 ng/mL                 PCT < 0.1 ng/mL     Stopping of antibiotics            Stopping of antibiotics       strongly encouraged.               strongly encouraged.    ----------------------------     ------------------------------       PCT level decrease by               PCT < 0.25 ng/mL       >= 80% from peak PCT       OR PCT 0.25 - 0.5 ng/mL          Stopping of antibiotics                                             encouraged.     Stopping of antibiotics           encouraged.    ----------------------------     ------------------------------       PCT level decrease by              PCT >= 0.25 ng/mL       < 80% from peak PCT        AND PCT >= 0.5 ng/mL            Continuin g antibiotics                                              encouraged.       Continuing  antibiotics            encouraged.    ----------------------------     ------------------------------     PCT level increase compared          PCT > 0.5 ng/mL         with peak PCT AND          PCT >= 0.5 ng/mL             Escalation of antibiotics                                          strongly encouraged.      Escalation of antibiotics        strongly encouraged.   CBC     Status: Abnormal   Collection Time: 03/29/17  3:55 AM  Result Value Ref Range   WBC 6.5 3.6 - 11.0 K/uL   RBC 3.56 (L)  3.80 - 5.20 MIL/uL   Hemoglobin 10.4 (L) 12.0 - 16.0 g/dL   HCT 32.4 (L) 35.0 - 47.0 %   MCV 91.0 80.0 - 100.0 fL   MCH 29.2 26.0 - 34.0 pg   MCHC 32.1 32.0 - 36.0 g/dL   RDW 17.3 (H) 11.5 - 14.5 %   Platelets 80 (L) 150 - 440 K/uL  Basic metabolic panel     Status: Abnormal   Collection Time: 03/29/17  3:55 AM  Result Value Ref Range   Sodium 146 (H) 135 - 145 mmol/L    Comment: ELECTROLYTES REPEATED.PMH   Potassium 4.3 3.5 - 5.1 mmol/L   Chloride 113 (H) 101 - 111 mmol/L   CO2 27 22 - 32 mmol/L   Glucose, Bld 163 (H) 65 - 99 mg/dL   BUN 12 6 - 20 mg/dL   Creatinine, Ser 0.80 0.44 - 1.00 mg/dL   Calcium 8.4 (L) 8.9 - 10.3 mg/dL   GFR calc non Af Amer >60 >60 mL/min   GFR calc Af Amer >60 >60 mL/min    Comment: (NOTE) The eGFR has been calculated using the CKD EPI equation. This calculation has not been validated in all clinical situations. eGFR's persistently <60 mL/min signify possible Chronic Kidney Disease.    Anion gap 6 5 - 15  Magnesium     Status: Abnormal   Collection Time: 03/29/17  3:55 AM  Result Value Ref Range   Magnesium 1.4 (L) 1.7 - 2.4 mg/dL  Phosphorus     Status: Abnormal   Collection Time: 03/29/17  3:55 AM  Result Value Ref Range   Phosphorus 2.3 (L) 2.5 - 4.6 mg/dL  Glucose, capillary     Status: Abnormal   Collection Time: 03/29/17  8:09 AM  Result Value Ref Range   Glucose-Capillary 151 (H) 65 - 99 mg/dL  Glucose, capillary     Status: Abnormal    Collection Time: 03/29/17 10:59 AM  Result Value Ref Range   Glucose-Capillary 101 (H) 65 - 99 mg/dL    Recent Results (from the past 240 hour(s))  MRSA PCR Screening     Status: None   Collection Time: 03/27/17 12:17 PM  Result Value Ref Range Status   MRSA by PCR NEGATIVE NEGATIVE Final    Comment:        The GeneXpert MRSA Assay (FDA approved for NASAL specimens only), is one component of a comprehensive MRSA colonization surveillance program. It is not intended to diagnose MRSA infection nor to guide or monitor treatment for MRSA infections.   CULTURE, BLOOD (ROUTINE X 2) w Reflex to ID Panel     Status: None (Preliminary result)   Collection Time: 03/27/17  1:51 PM  Result Value Ref Range Status   Specimen Description BLOOD RIGHT HAND  Final   Special Requests   Final    BOTTLES DRAWN AEROBIC AND ANAEROBIC Blood Culture adequate volume   Culture  Setup Time   Final    GRAM POSITIVE COCCI AEROBIC BOTTLE ONLY CRITICAL RESULT CALLED TO, READ BACK BY AND VERIFIED WITH: LISA CLUTTS AT 1605 03/28/17. MSS    Culture GRAM POSITIVE COCCI  Final   Report Status PENDING  Incomplete  Blood Culture ID Panel (Reflexed)     Status: Abnormal   Collection Time: 03/27/17  1:51 PM  Result Value Ref Range Status   Enterococcus species NOT DETECTED NOT DETECTED Final   Listeria monocytogenes NOT DETECTED NOT DETECTED Final   Staphylococcus species DETECTED (A) NOT DETECTED  Final    Comment: Methicillin (oxacillin) susceptible coagulase negative staphylococcus. Possible blood culture contaminant (unless isolated from more than one blood culture draw or clinical case suggests pathogenicity). No antibiotic treatment is indicated for blood  culture contaminants. CRITICAL RESULT CALLED TO, READ BACK BY AND VERIFIED WITH: LISA CLUTTS AT 1605 03/28/17. MSS    Staphylococcus aureus NOT DETECTED NOT DETECTED Final   Methicillin resistance NOT DETECTED NOT DETECTED Final   Streptococcus species  NOT DETECTED NOT DETECTED Final   Streptococcus agalactiae NOT DETECTED NOT DETECTED Final   Streptococcus pneumoniae NOT DETECTED NOT DETECTED Final   Streptococcus pyogenes NOT DETECTED NOT DETECTED Final   Acinetobacter baumannii NOT DETECTED NOT DETECTED Final   Enterobacteriaceae species NOT DETECTED NOT DETECTED Final   Enterobacter cloacae complex NOT DETECTED NOT DETECTED Final   Escherichia coli NOT DETECTED NOT DETECTED Final   Klebsiella oxytoca NOT DETECTED NOT DETECTED Final   Klebsiella pneumoniae NOT DETECTED NOT DETECTED Final   Proteus species NOT DETECTED NOT DETECTED Final   Serratia marcescens NOT DETECTED NOT DETECTED Final   Haemophilus influenzae NOT DETECTED NOT DETECTED Final   Neisseria meningitidis NOT DETECTED NOT DETECTED Final   Pseudomonas aeruginosa NOT DETECTED NOT DETECTED Final   Candida albicans NOT DETECTED NOT DETECTED Final   Candida glabrata NOT DETECTED NOT DETECTED Final   Candida krusei NOT DETECTED NOT DETECTED Final   Candida parapsilosis NOT DETECTED NOT DETECTED Final   Candida tropicalis NOT DETECTED NOT DETECTED Final  Urine Culture     Status: None   Collection Time: 03/27/17  2:41 PM  Result Value Ref Range Status   Specimen Description URINE, RANDOM  Final   Special Requests NONE  Final   Culture   Final    NO GROWTH Performed at [facility], [address], [place], [place] [address]    Report Status 03/28/2017 FINAL  Final  CULTURE, BLOOD (ROUTINE X 2) w Reflex to ID Panel     Status: None (Preliminary result)   Collection Time: 03/27/17  3:10 PM  Result Value Ref Range Status   Specimen Description BLOOD BLOOD RIGHT HAND  Final   Special Requests   Final    BOTTLES DRAWN AEROBIC AND ANAEROBIC Blood Culture adequate volume   Culture NO GROWTH 2 DAYS  Final   Report Status PENDING  Incomplete    Lipid Panel No results for input(s): CHOL, TRIG, HDL, CHOLHDL, VLDL, LDLCALC in the last 72 hours.  Studies/Results: Ct  Head Wo Contrast  Result Date: 03/28/2017 CLINICAL DATA:  Altered mental status and hypothermia today. EXAM: CT HEAD WITHOUT CONTRAST TECHNIQUE: Contiguous axial images were obtained from the base of the skull through the vertex without intravenous contrast. COMPARISON:  Brain MRI and head CT scan 03/24/2017. FINDINGS: Brain: Appears normal without hemorrhage, infarct, mass lesion, mass effect, midline shift or abnormal extra-axial fluid collection. No hydrocephalus or pneumocephalus. Vascular: No hyperdense vessel or unexpected calcification. Skull: Intact. Sinuses/Orbits: Negative. Other: None. IMPRESSION: Negative head CT. Electronically Signed   By: Inge Rise M.D.   On: 03/28/2017 14:47   Dg Chest Port 1 View  Result Date: 03/27/2017 CLINICAL DATA:  Central line placement EXAM: PORTABLE CHEST 1 VIEW COMPARISON:  03/24/2017 chest radiograph. FINDINGS: Right internal jugular central venous catheter terminates in the middle third of the superior vena cava. Stable cardiomediastinal silhouette with top-normal heart size. No pneumothorax. No pleural effusion. Stable hazy curvilinear opacities in the parahilar right lung. No acute consolidative airspace disease.  IMPRESSION: 1. Right internal jugular central venous catheter terminates in the middle third of the superior vena cava. No pneumothorax. 2. Stable hazy curvilinear right parahilar lung opacities, favor scarring or atelectasis. Electronically Signed   By: Ilona Sorrel M.D.   On: 03/27/2017 13:50      Assessment/Plan:  56 y/o F admitted with hypoglycemia, hypothermia and suspicion of septic shock in setting of positive blood cx. On broad spectrum antibiotics  Pt is slighly more awake toady and is off pressors.  Does not track but opens eyes.  Last CTH done yesterday that doesn't show any abnormalities WBC 6.5 and no fever. Would hold off LP I think this is likely metabolic in nature in setting of hypoglycemia and infection  Since she  is improving would hold off MRI repeat but if doesn't improve any further would repeat MRI brain with and w/out contrast.  Last MRI done 10/22        LOS: 2 days    03/29/2017  11:32 AM

## 2017-03-29 NOTE — Progress Notes (Signed)
Patient ID: Sarah Hurst, female   DOB: 03-15-1961, 56 y.o.   MRN: [id number]  [facility] PROGRESS NOTE  Reshanda Lewey HTX:[id number] DOB: [birth year] DOA: 03/24/2017 PCP: System, Pcp Not In  HPI/Subjective: Patient withdrew from painful stimuli of the lower extremities.  Unresponsive to sternal rub or upper extremity painful stimuli.  Objective: Vitals:   03/29/17 1100 03/29/17 1200  BP: 115/73 114/80  Pulse:    Resp: 17 17  Temp: (!) 97.2 F (36.2 C) (!) 97.2 F (36.2 C)  SpO2:  98%    Filed Weights   03/24/17 1210  Weight: (!) 143.8 kg (317 lb)    ROS: Review of Systems  Unable to perform ROS: Acuity of condition   Exam: Physical Exam  HENT:  Nose: No mucosal edema.  Unable to look in her mouth  Eyes: EOM and lids are normal.  Pupils pinpoint  Neck: Carotid bruit is not present.  Respiratory: No respiratory distress. She has decreased breath sounds in the right lower field and the left lower field. She has no wheezes. She has no rhonchi. She has no rales.  GI: Soft. Bowel sounds are normal. There is no tenderness.  Musculoskeletal:       Right ankle: She exhibits swelling.       Left ankle: She exhibits swelling.  Neurological: She is unresponsive.  Withdraws to painful stimuli of the lower extremity  Skin: Skin is warm. No rash noted. Nails show no clubbing.  Psychiatric:  Unable to assess secondary to altered mental status      Data Reviewed: Basic Metabolic Panel:  Recent Labs Lab 03/24/17 1258 03/26/17 1750 03/27/17 0817 03/28/17 0410 03/29/17 0355  NA 149* 148* 147* 148* 146*  K 3.8 3.3* 3.4* 4.1 4.3  CL 112* 117* 113* 119* 113*  CO2 28 24 29 28 27   GLUCOSE 77 74 85 91 163*  BUN 14 9 10 12 12   CREATININE 0.61 0.49 0.61 1.12* 0.80  CALCIUM 9.8 8.0* 8.9 8.1* 8.4*  MG  --   --   --   --  1.4*  PHOS  --   --   --   --  2.3*   Liver Function Tests:  Recent Labs Lab 03/24/17 1258 03/26/17 1750 03/27/17 0817 03/28/17 0410  AST  60* 64* 61* 50*  ALT 37 42 47 41  ALKPHOS 64 44 60 51  BILITOT 0.6 0.6 0.5 0.3  PROT 7.1 5.2* 5.8* 5.4*  ALBUMIN 3.5 2.4* 2.6* 2.4*   No results for input(s): LIPASE, AMYLASE in the last 168 hours.  Recent Labs Lab 03/24/17 1420  AMMONIA 19   CBC:  Recent Labs Lab 03/24/17 1258 03/26/17 1750 03/27/17 0817 03/28/17 0410 03/29/17 0355  WBC 4.2 4.0 3.6 5.6 6.5  NEUTROABS  --   --   --  4.0  --   HGB 10.7* 10.7* 11.3* 10.5* 10.4*  HCT 33.5* 34.2* 35.6 33.7* 32.4*  MCV 90.7 90.4 90.3 90.4 91.0  PLT 98* 104* 99* 101* 80*   Cardiac Enzymes:  Recent Labs Lab 03/24/17 1258  TROPONINI <0.03   BNP (last 3 results)  Recent Labs  11/25/16 0110  BNP 91.0     CBG:  Recent Labs Lab 03/28/17 1932 03/29/17 0057 03/29/17 0348 03/29/17 0809 03/29/17 1059  GLUCAP 118* 158* 159* 151* 101*    Recent Results (from the past 240 hour(s))  MRSA PCR Screening     Status: None   Collection Time: 03/27/17 12:17 PM  Result Value Ref  Range Status   MRSA by PCR NEGATIVE NEGATIVE Final    Comment:        The GeneXpert MRSA Assay (FDA approved for NASAL specimens only), is one component of a comprehensive MRSA colonization surveillance program. It is not intended to diagnose MRSA infection nor to guide or monitor treatment for MRSA infections.   CULTURE, BLOOD (ROUTINE X 2) w Reflex to ID Panel     Status: None (Preliminary result)   Collection Time: 03/27/17  1:51 PM  Result Value Ref Range Status   Specimen Description BLOOD RIGHT HAND  Final   Special Requests   Final    BOTTLES DRAWN AEROBIC AND ANAEROBIC Blood Culture adequate volume   Culture  Setup Time   Final    GRAM POSITIVE COCCI AEROBIC BOTTLE ONLY CRITICAL RESULT CALLED TO, READ BACK BY AND VERIFIED WITH: LISA CLUTTS AT 1605 03/28/17. MSS    Culture GRAM POSITIVE COCCI  Final   Report Status PENDING  Incomplete  Blood Culture ID Panel (Reflexed)     Status: Abnormal   Collection Time: 03/27/17  1:51  PM  Result Value Ref Range Status   Enterococcus species NOT DETECTED NOT DETECTED Final   Listeria monocytogenes NOT DETECTED NOT DETECTED Final   Staphylococcus species DETECTED (A) NOT DETECTED Final    Comment: Methicillin (oxacillin) susceptible coagulase negative staphylococcus. Possible blood culture contaminant (unless isolated from more than one blood culture draw or clinical case suggests pathogenicity). No antibiotic treatment is indicated for blood  culture contaminants. CRITICAL RESULT CALLED TO, READ BACK BY AND VERIFIED WITH: LISA CLUTTS AT 1605 03/28/17. MSS    Staphylococcus aureus NOT DETECTED NOT DETECTED Final   Methicillin resistance NOT DETECTED NOT DETECTED Final   Streptococcus species NOT DETECTED NOT DETECTED Final   Streptococcus agalactiae NOT DETECTED NOT DETECTED Final   Streptococcus pneumoniae NOT DETECTED NOT DETECTED Final   Streptococcus pyogenes NOT DETECTED NOT DETECTED Final   Acinetobacter baumannii NOT DETECTED NOT DETECTED Final   Enterobacteriaceae species NOT DETECTED NOT DETECTED Final   Enterobacter cloacae complex NOT DETECTED NOT DETECTED Final   Escherichia coli NOT DETECTED NOT DETECTED Final   Klebsiella oxytoca NOT DETECTED NOT DETECTED Final   Klebsiella pneumoniae NOT DETECTED NOT DETECTED Final   Proteus species NOT DETECTED NOT DETECTED Final   Serratia marcescens NOT DETECTED NOT DETECTED Final   Haemophilus influenzae NOT DETECTED NOT DETECTED Final   Neisseria meningitidis NOT DETECTED NOT DETECTED Final   Pseudomonas aeruginosa NOT DETECTED NOT DETECTED Final   Candida albicans NOT DETECTED NOT DETECTED Final   Candida glabrata NOT DETECTED NOT DETECTED Final   Candida krusei NOT DETECTED NOT DETECTED Final   Candida parapsilosis NOT DETECTED NOT DETECTED Final   Candida tropicalis NOT DETECTED NOT DETECTED Final  Urine Culture     Status: None   Collection Time: 03/27/17  2:41 PM  Result Value Ref Range Status   Specimen  Description URINE, RANDOM  Final   Special Requests NONE  Final   Culture   Final    NO GROWTH Performed at [facility], [address], [place], [place] [address]    Report Status 03/28/2017 FINAL  Final  CULTURE, BLOOD (ROUTINE X 2) w Reflex to ID Panel     Status: None (Preliminary result)   Collection Time: 03/27/17  3:10 PM  Result Value Ref Range Status   Specimen Description BLOOD BLOOD RIGHT HAND  Final   Special Requests   Final  BOTTLES DRAWN AEROBIC AND ANAEROBIC Blood Culture adequate volume   Culture NO GROWTH 2 DAYS  Final   Report Status PENDING  Incomplete     Studies: Ct Head Wo Contrast  Result Date: 03/28/2017 CLINICAL DATA:  Altered mental status and hypothermia today. EXAM: CT HEAD WITHOUT CONTRAST TECHNIQUE: Contiguous axial images were obtained from the base of the skull through the vertex without intravenous contrast. COMPARISON:  Brain MRI and head CT scan 03/24/2017. FINDINGS: Brain: Appears normal without hemorrhage, infarct, mass lesion, mass effect, midline shift or abnormal extra-axial fluid collection. No hydrocephalus or pneumocephalus. Vascular: No hyperdense vessel or unexpected calcification. Skull: Intact. Sinuses/Orbits: Negative. Other: None. IMPRESSION: Negative head CT. Electronically Signed   By: Inge Rise M.D.   On: 03/28/2017 14:47   Dg Chest Port 1 View  Result Date: 03/27/2017 CLINICAL DATA:  Central line placement EXAM: PORTABLE CHEST 1 VIEW COMPARISON:  03/24/2017 chest radiograph. FINDINGS: Right internal jugular central venous catheter terminates in the middle third of the superior vena cava. Stable cardiomediastinal silhouette with top-normal heart size. No pneumothorax. No pleural effusion. Stable hazy curvilinear opacities in the parahilar right lung. No acute consolidative airspace disease. IMPRESSION: 1. Right internal jugular central venous catheter terminates in the middle third of the superior vena cava. No  pneumothorax. 2. Stable hazy curvilinear right parahilar lung opacities, favor scarring or atelectasis. Electronically Signed   By: Ilona Sorrel M.D.   On: 03/27/2017 13:50    Scheduled Meds: . chlorhexidine gluconate (MEDLINE KIT)  15 mL Mouth Rinse BID  . dextrose  1 ampule Intravenous Once  . dextrose  1 ampule Intravenous Once  . enoxaparin (LOVENOX) injection  40 mg Subcutaneous Q24H  . hydrocortisone sod succinate (SOLU-CORTEF) inj  50 mg Intravenous Q6H  . mouth rinse  15 mL Mouth Rinse QID   Continuous Infusions: . D-10-0.45% Sodium Chloride with KCL 40 meq/L 1000 ml 25 mL/hr at 03/29/17 1237  . D-10-0.45% Sodium Chloride with KCL 40 meq/L 1000 ml    . norepinephrine (LEVOPHED) Adult infusion Stopped (03/29/17 0200)  . phenylephrine (NEO-SYNEPHRINE) Adult infusion Stopped (03/28/17 1259)  . piperacillin-tazobactam (ZOSYN)  IV 3.375 g (03/29/17 0601)  . vasopressin (PITRESSIN) infusion - *FOR SHOCK* Stopped (03/29/17 0800)    Assessment/Plan:  1. Clinical sepsis but source unclear.  Patient off pressors at this point.  Hypotension improved.  Hypothermia improved.  Empiric antibiotics.  Staph species in 1 blood culture bottle could be a contamination.  Pro-calcitonin negative x3.  The patient placed on stress dose steroids. 2. Hypoglycemia.  On low-dose D5 drip. 3. Acute encephalopathy.  Could be secondary to psychiatric issues versus clinical sepsis.  Appreciate psychiatric and neurology consultations.  Patient withdrawing from painful stimuli of the lower extremities. 4. History of schizoaffective disorder 5. History of sleep apnea 6. History of GERD 7. Thrombocytopenia.  Continue to monitor closely. 8. Hypomagnesemia.  Replace as per protocol in ICU.  Code Status:     Code Status Orders        Start     Ordered   03/27/17 1700  Full code  Continuous     03/27/17 1659    Code Status History    Date Active Date Inactive Code Status Order ID Comments User Context    01/24/2017  5:35 PM 01/28/2017  8:51 PM Full Code [id number]  Julianne Rice, MD ED   01/10/2017 10:02 PM 01/11/2017  6:32 PM Full Code [id number]  Daleen Bo, MD ED   12/26/2016  5:18  PM 12/26/2016 11:20 PM Full Code [id number]  Julianne Rice, MD ED   12/13/2016  3:16 PM 12/14/2016 11:02 PM Full Code [id number]  Noemi Chapel, MD ED     Family Communication: As per critical care specialist Disposition Plan: To be determined for  Consultants:  Critical care specialist  Neurology  Psychiatry  Antibiotics:  Vancomycin and Zosyn  Time spent: 25 minutes  [place], [place]

## 2017-03-29 NOTE — Progress Notes (Signed)
Pt's cbg's had been running 150's so per Dr. Belia HemanKasa,  D10 gtt @100cc /hr can be dc'd.  Done at 8am.  Noon time CBG 101.  Updated Dr. Belia HemanKasa and rec'd order to restart D10 w/KCL at 25cc/hr.  Lurene ShadowBAllen, RN

## 2017-03-29 NOTE — Progress Notes (Signed)
Notified by centralized tele of pt being bradycardic.  On monitor SB 40's.  When pt is stimulated, HR goes back to SR in the 60's.  Will pass along to Seward GraterMaggie, NPLurene Shadow.  BAllen, RN

## 2017-03-30 DIAGNOSIS — R0689 Other abnormalities of breathing: Secondary | ICD-10-CM

## 2017-03-30 LAB — CULTURE, BLOOD (ROUTINE X 2): SPECIAL REQUESTS: ADEQUATE

## 2017-03-30 LAB — GLUCOSE, CAPILLARY
GLUCOSE-CAPILLARY: 75 mg/dL (ref 65–99)
GLUCOSE-CAPILLARY: 77 mg/dL (ref 65–99)
GLUCOSE-CAPILLARY: 166 mg/dL — AB (ref 65–99)
Glucose-Capillary: 81 mg/dL (ref 65–99)
Glucose-Capillary: 88 mg/dL (ref 65–99)

## 2017-03-30 MED ORDER — MAGNESIUM SULFATE 2 GM/50ML IV SOLN
2.0000 g | Freq: Once | INTRAVENOUS | Status: AC
  Administered 2017-03-30: 2 g via INTRAVENOUS
  Filled 2017-03-30: qty 50

## 2017-03-30 MED ORDER — ENOXAPARIN SODIUM 40 MG/0.4ML ~~LOC~~ SOLN
40.0000 mg | Freq: Two times a day (BID) | SUBCUTANEOUS | Status: DC
  Administered 2017-03-30 – 2017-04-08 (×18): 40 mg via SUBCUTANEOUS
  Filled 2017-03-30 (×18): qty 0.4

## 2017-03-30 MED ORDER — DEXTROSE 10 % IV SOLN
INTRAVENOUS | Status: DC
  Administered 2017-03-30: 15:00:00 via INTRAVENOUS

## 2017-03-30 NOTE — Progress Notes (Signed)
Order for enoxaparin 40 mg subQ daily was changed to enoxaparin 40 mg BID for BMI > 40 and CrCl > 30 mL/min. Per protocol.  Cindi CarbonMary M Elara Cocke, PharmD 03/30/17 8:12 AM

## 2017-03-30 NOTE — Progress Notes (Signed)
Notified Dr. Renae GlossWieting of pt HR and temp. Dr acknowledged, orders received.

## 2017-03-30 NOTE — Progress Notes (Signed)
Patient ID: Sarah Hurst, female   DOB: [birth year], 56 y.o.   MRN: [id number]  [facility] PROGRESS NOTE  Shakeria Robinette FFM:[id number] DOB: 15-Mar-1961 DOA: 03/24/2017 PCP: System, Pcp Not In  HPI/Subjective: Patient sitting up and eating lunch.  She asked me if I saw some ice in the garbage can.  She heard some wrestling over there.  She said one of the workers put down some mouse traps.  I confirmed this with the staff that there are no mice in the room.  Patient states she feels very weak.  Does have some constipation.  Objective: Vitals:   03/30/17 0600 03/30/17 0800  BP: 126/87   Pulse:    Resp: 13   Temp:  97.8 F (36.6 C)  SpO2:      Filed Weights   03/24/17 1210  Weight: (!) 143.8 kg (317 lb)    ROS: Review of Systems  Unable to perform ROS: Acuity of condition  Respiratory: Negative for cough.   Cardiovascular: Negative for chest pain.  Gastrointestinal: Negative for abdominal pain, constipation, nausea and vomiting.  Musculoskeletal: Negative for back pain and neck pain.  Neurological: Positive for weakness. Negative for dizziness.   Exam: Physical Exam  HENT:  Nose: No mucosal edema.  Mouth/Throat: No oropharyngeal exudate.  Unable to look in her mouth  Eyes: EOM and lids are normal.  Pupils pinpoint  Neck: Carotid bruit is not present.  Cardiovascular: Regular rhythm, S1 normal, S2 normal and normal heart sounds.   Respiratory: No respiratory distress. She has decreased breath sounds in the right lower field and the left lower field. She has no wheezes. She has no rhonchi. She has no rales.  GI: Soft. Bowel sounds are normal. There is no tenderness.  Musculoskeletal:       Right ankle: She exhibits swelling.       Left ankle: She exhibits swelling.  Neurological: She is alert.  Able to wiggle toes and feet herself with a fork.  Skin: Skin is warm. No rash noted. Nails show no clubbing.  Psychiatric: She has a normal mood and affect. She is actively  hallucinating.      Data Reviewed: Basic Metabolic Panel:  Recent Labs Lab 03/24/17 1258 03/26/17 1750 03/27/17 0817 03/28/17 0410 03/29/17 0355  NA 149* 148* 147* 148* 146*  K 3.8 3.3* 3.4* 4.1 4.3  CL 112* 117* 113* 119* 113*  CO2 28 24 29 28 27   GLUCOSE 77 74 85 91 163*  BUN 14 9 10 12 12   CREATININE 0.61 0.49 0.61 1.12* 0.80  CALCIUM 9.8 8.0* 8.9 8.1* 8.4*  MG  --   --   --   --  1.4*  PHOS  --   --   --   --  2.3*   Liver Function Tests:  Recent Labs Lab 03/24/17 1258 03/26/17 1750 03/27/17 0817 03/28/17 0410  AST 60* 64* 61* 50*  ALT 37 42 47 41  ALKPHOS 64 44 60 51  BILITOT 0.6 0.6 0.5 0.3  PROT 7.1 5.2* 5.8* 5.4*  ALBUMIN 3.5 2.4* 2.6* 2.4*    Recent Labs Lab 03/24/17 1420  AMMONIA 19   CBC:  Recent Labs Lab 03/24/17 1258 03/26/17 1750 03/27/17 0817 03/28/17 0410 03/29/17 0355  WBC 4.2 4.0 3.6 5.6 6.5  NEUTROABS  --   --   --  4.0  --   HGB 10.7* 10.7* 11.3* 10.5* 10.4*  HCT 33.5* 34.2* 35.6 33.7* 32.4*  MCV 90.7 90.4 90.3 90.4 91.0  PLT 98* 104* 99* 101* 80*   Cardiac Enzymes:  Recent Labs Lab 03/24/17 1258  TROPONINI <0.03   BNP (last 3 results)  Recent Labs  11/25/16 0110  BNP 91.0     CBG:  Recent Labs Lab 03/29/17 1059 03/29/17 1611 03/30/17 0410 03/30/17 0759 03/30/17 1202  GLUCAP 101* 91 81 75 77    Recent Results (from the past 240 hour(s))  MRSA PCR Screening     Status: None   Collection Time: 03/27/17 12:17 PM  Result Value Ref Range Status   MRSA by PCR NEGATIVE NEGATIVE Final    Comment:        The GeneXpert MRSA Assay (FDA approved for NASAL specimens only), is one component of a comprehensive MRSA colonization surveillance program. It is not intended to diagnose MRSA infection nor to guide or monitor treatment for MRSA infections.   CULTURE, BLOOD (ROUTINE X 2) w Reflex to ID Panel     Status: Abnormal   Collection Time: 03/27/17  1:51 PM  Result Value Ref Range Status   Specimen  Description BLOOD RIGHT HAND  Final   Special Requests   Final    BOTTLES DRAWN AEROBIC AND ANAEROBIC Blood Culture adequate volume   Culture  Setup Time   Final    GRAM POSITIVE COCCI AEROBIC BOTTLE ONLY CRITICAL RESULT CALLED TO, READ BACK BY AND VERIFIED WITH: LISA CLUTTS AT 1605 03/28/17. MSS    Culture (A)  Final    STAPHYLOCOCCUS SPECIES (COAGULASE NEGATIVE) THE SIGNIFICANCE OF ISOLATING THIS ORGANISM FROM A SINGLE SET OF BLOOD CULTURES WHEN MULTIPLE SETS ARE DRAWN IS UNCERTAIN. PLEASE NOTIFY THE MICROBIOLOGY DEPARTMENT WITHIN ONE WEEK IF SPECIATION AND SENSITIVITIES ARE REQUIRED. Performed at [facility], [place] [address], [place], [place] [address]    Report Status 03/30/2017 FINAL  Final  Blood Culture ID Panel (Reflexed)     Status: Abnormal   Collection Time: 03/27/17  1:51 PM  Result Value Ref Range Status   Enterococcus species NOT DETECTED NOT DETECTED Final   Listeria monocytogenes NOT DETECTED NOT DETECTED Final   Staphylococcus species DETECTED (A) NOT DETECTED Final    Comment: Methicillin (oxacillin) susceptible coagulase negative staphylococcus. Possible blood culture contaminant (unless isolated from more than one blood culture draw or clinical case suggests pathogenicity). No antibiotic treatment is indicated for blood  culture contaminants. CRITICAL RESULT CALLED TO, READ BACK BY AND VERIFIED WITH: LISA CLUTTS AT 1605 03/28/17. MSS    Staphylococcus aureus NOT DETECTED NOT DETECTED Final   Methicillin resistance NOT DETECTED NOT DETECTED Final   Streptococcus species NOT DETECTED NOT DETECTED Final   Streptococcus agalactiae NOT DETECTED NOT DETECTED Final   Streptococcus pneumoniae NOT DETECTED NOT DETECTED Final   Streptococcus pyogenes NOT DETECTED NOT DETECTED Final   Acinetobacter baumannii NOT DETECTED NOT DETECTED Final   Enterobacteriaceae species NOT DETECTED NOT DETECTED Final   Enterobacter cloacae complex NOT DETECTED NOT DETECTED Final    Escherichia coli NOT DETECTED NOT DETECTED Final   Klebsiella oxytoca NOT DETECTED NOT DETECTED Final   Klebsiella pneumoniae NOT DETECTED NOT DETECTED Final   Proteus species NOT DETECTED NOT DETECTED Final   Serratia marcescens NOT DETECTED NOT DETECTED Final   Haemophilus influenzae NOT DETECTED NOT DETECTED Final   Neisseria meningitidis NOT DETECTED NOT DETECTED Final   Pseudomonas aeruginosa NOT DETECTED NOT DETECTED Final   Candida albicans NOT DETECTED NOT DETECTED Final   Candida glabrata NOT DETECTED NOT DETECTED Final   Candida krusei NOT  DETECTED NOT DETECTED Final   Candida parapsilosis NOT DETECTED NOT DETECTED Final   Candida tropicalis NOT DETECTED NOT DETECTED Final  Urine Culture     Status: None   Collection Time: 03/27/17  2:41 PM  Result Value Ref Range Status   Specimen Description URINE, RANDOM  Final   Special Requests NONE  Final   Culture   Final    NO GROWTH Performed at [facility], [place] [address] [place],  [address]    Report Status 03/28/2017 FINAL  Final  CULTURE, BLOOD (ROUTINE X 2) w Reflex to ID Panel     Status: None (Preliminary result)   Collection Time: 03/27/17  3:10 PM  Result Value Ref Range Status   Specimen Description BLOOD BLOOD RIGHT HAND  Final   Special Requests   Final    BOTTLES DRAWN AEROBIC AND ANAEROBIC Blood Culture adequate volume   Culture NO GROWTH 3 DAYS  Final   Report Status PENDING  Incomplete     Studies: Ct Head Wo Contrast  Result Date: 03/28/2017 CLINICAL DATA:  Altered mental status and hypothermia today. EXAM: CT HEAD WITHOUT CONTRAST TECHNIQUE: Contiguous axial images were obtained from the base of the skull through the vertex without intravenous contrast. COMPARISON:  Brain MRI and head CT scan 03/24/2017. FINDINGS: Brain: Appears normal without hemorrhage, infarct, mass lesion, mass effect, midline shift or abnormal extra-axial fluid collection. No hydrocephalus or pneumocephalus. Vascular: No  hyperdense vessel or unexpected calcification. Skull: Intact. Sinuses/Orbits: Negative. Other: None. IMPRESSION: Negative head CT. Electronically Signed   By: Inge Rise M.D.   On: 03/28/2017 14:47    Scheduled Meds: . chlorhexidine gluconate (MEDLINE KIT)  15 mL Mouth Rinse BID  . dextrose  1 ampule Intravenous Once  . dextrose  1 ampule Intravenous Once  . enoxaparin (LOVENOX) injection  40 mg Subcutaneous Q12H  . hydrocortisone sod succinate (SOLU-CORTEF) inj  50 mg Intravenous Q6H  . mouth rinse  15 mL Mouth Rinse QID   Continuous Infusions: . D-10-0.45% Sodium Chloride with KCL 40 meq/L 1000 ml    . piperacillin-tazobactam (ZOSYN)  IV Stopped (03/30/17 0123)    Assessment/Plan:  1. Clinical sepsis but source unclear.  Patient off pressors for the second day.  Hypotension improved.  Hypothermia improved.  Empiric antibiotics.  Staph species in 1 blood culture bottle likely a contamination.  Pro-calcitonin negative x3. The patient placed on stress dose steroids and will need tapering of this. 2. Hypoglycemia.  On low-dose D5 drip.  Now that the patient is eating, this likely can be discontinued 3. Acute encephalopathy.  Better today 4. History of schizoaffective disorder.  Can consider restarting psychiatric medications 5. History of sleep apnea 6. History of GERD 7. Thrombocytopenia.  Continue to monitor closely. 8. Hypomagnesemia.  Replace as per protocol in ICU.  Code Status:     Code Status Orders        Start     Ordered   03/27/17 1700  Full code  Continuous     03/27/17 1659    Code Status History    Date Active Date Inactive Code Status Order ID Comments User Context   01/24/2017  5:35 PM 01/28/2017  8:51 PM Full Code [id number]  Julianne Rice, MD ED   01/10/2017 10:02 PM 01/11/2017  6:32 PM Full Code [id number]  Daleen Bo, MD ED   12/26/2016  5:18 PM 12/26/2016 11:20 PM Full Code [id number]  Julianne Rice, MD ED   12/13/2016  3:16  PM 12/14/2016 11:02 PM  Full Code [id number]  Noemi Chapel, MD ED     Family Communication: As per critical care specialist Disposition Plan: To be determined   Consultants:  Critical care specialist  Neurology  Psychiatry  Antibiotics:  Zosyn  Time spent: 28 minutes  [place], [place]

## 2017-03-30 NOTE — Progress Notes (Signed)
Pt much more awake and alert this morning.  Reqesting to eat and feed herself.  Appropriately answering questions.  Called sister, per pt request, to update on condition.  Lurene ShadowBAllen, RN

## 2017-03-30 NOTE — Consult Note (Addendum)
   Name: Sarah CapeDeborah Hurst MRN: [id number] DOB: 08/01/1960    ADMISSION DATE:  03/24/2017 CONSULTATION DATE: 03/27/2017  REFERRING MD : Dr. Amado CoeGouru  CHIEF COMPLAINT: Hypoglycemia   BRIEF PATIENT DESCRIPTION: 56 yo female admitted 10/25 due to hypoglycemia, hypothermia and hypotension concerning for sepsis   SIGNIFICANT EVENTS  10/25-Pt admitted to ICU   STUDIES:  None   HISTORY OF PRESENT ILLNESS:   This is a 56 yo female with a PMH of OSA, Schizoaffective Disorder, Mental Disability, GERD, and Asthma.  [facility] from a group home with hypoglycemia and poor po intake.  Per ER notes the pt was recently evaluated in the ER by neurology and psychiatry on 10/22 due to multiple falls and somnolence, MRI and CT head results negative at that time and symptoms thought to be psych related, however psychiatrist did not think pt needed to be admitted to behavioral unit at that time and could be discharged home.  This presentation in the ER pt found to be hypoglycemic requiring multiple doses of D50, hypothermic rectal temp 91.2 F, and nonverbal unable to follow commands.  She was subsequently admitted to ICU by hospitalist team for further workup and treatment PCCM consulted.     Subjective Patient is alert and awake today no apparent distress Patient asking for food Patient weaned off of vasopressors Patient is stable to transfer to general medical floor    REVIEW OF SYSTEMS:   Denies any pain shortness of breath nausea or vomiting at this time Other review of systems is negative    VITAL SIGNS: Temp:  [95 F (35 C)-99 F (37.2 C)] 97.8 F (36.6 C) (10/28 0800) Pulse Rate:  [52-62] 53 (10/28 0500) Resp:  [10-21] 13 (10/28 0600) BP: (104-141)/(60-96) 126/87 (10/28 0600) SpO2:  [98 %-100 %] 100 % (10/28 0500)  PHYSICAL EXAMINATION: General: well developed, well nourished female, NAD  Neuro:  PERRL alert awake following commands HEENT: supple, no JVD    Cardiovascular: nsr, s1s2, no M/R/G Lungs: rhonchi throughout, even, non labored  Abdomen: +BS x4, soft, obese, non distended  Musculoskeletal: normal bulk and tone, no edema  Skin: intact no rashes or lesions    Recent Labs Lab 03/27/17 0817 03/28/17 0410 03/29/17 0355  NA 147* 148* 146*  K 3.4* 4.1 4.3  CL 113* 119* 113*  CO2 29 28 27   BUN 10 12 12   CREATININE 0.61 1.12* 0.80  GLUCOSE 85 91 163*    Recent Labs Lab 03/27/17 0817 03/28/17 0410 03/29/17 0355  HGB 11.3* 10.5* 10.4*  HCT 35.6 33.7* 32.4*  WBC 3.6 5.6 6.5  PLT 99* 101* 80*   ASSESSMENT / PLAN: Acute encephalopathy of unknown etiology -now resolved Most likely etiology of her shock and symptoms were adrenal insufficiency Hypotension-resolved Hypoglycemia -resolved- Hypokalemi resolved a Hx: OSA and Schizoaffective Disorder  P: Prn supplemental O2 to maintain O2 sats >92% or for dyspnea  Psychiatry consulted appreciate input Avoid sedating medications Frequent reorientation  Maintain map >65 D10W1/2 NS with 40 meq KCL @55  ml/hr  I do believe stress dose steroids has clinically improved the patient would recommend continuing for another  several days   Okay to transfer to general medical floor today  Lucie LeatherKurian David Jonessa Triplett, M.D.  [facility]  Medical Director [facility] Medical Director [facility] Cardio[facility] Department

## 2017-03-30 NOTE — Clinical Social Work Note (Signed)
CSW is following patient. CSW is aware that the patient's legal Guardian is Hilbert OdorVanda Thomas [phone]. [phone]) with [employer]. ED CSW has begun the level II PASSR process and is awaiting [place] MUST evaluation. The patient will be difficult for placement due to mental health concerns and payor source.  Argentina PonderKaren Martha Treasa Bradshaw, MSW, Theresia MajorsLCSWA  [phone]

## 2017-03-31 DIAGNOSIS — R001 Bradycardia, unspecified: Secondary | ICD-10-CM

## 2017-03-31 LAB — GLUCOSE, CAPILLARY
GLUCOSE-CAPILLARY: 102 mg/dL — AB (ref 65–99)
GLUCOSE-CAPILLARY: 103 mg/dL — AB (ref 65–99)
GLUCOSE-CAPILLARY: 106 mg/dL — AB (ref 65–99)
GLUCOSE-CAPILLARY: 108 mg/dL — AB (ref 65–99)
GLUCOSE-CAPILLARY: 98 mg/dL (ref 65–99)
Glucose-Capillary: 117 mg/dL — ABNORMAL HIGH (ref 65–99)
Glucose-Capillary: 121 mg/dL — ABNORMAL HIGH (ref 65–99)

## 2017-03-31 LAB — BASIC METABOLIC PANEL
ANION GAP: 5 (ref 5–15)
BUN: 26 mg/dL — AB (ref 6–20)
CHLORIDE: 113 mmol/L — AB (ref 101–111)
CO2: 27 mmol/L (ref 22–32)
Calcium: 9.2 mg/dL (ref 8.9–10.3)
Creatinine, Ser: 0.83 mg/dL (ref 0.44–1.00)
GFR calc Af Amer: >60 mL/min (ref >60)
GFR calc non Af Amer: >60 mL/min (ref >60)
GLUCOSE: 109 mg/dL — AB (ref 65–99)
POTASSIUM: 4.1 mmol/L (ref 3.5–5.1)
Sodium: 145 mmol/L (ref 135–145)

## 2017-03-31 MED ORDER — ATROPINE SULFATE 1 MG/10ML IJ SOSY
0.5000 mg | PREFILLED_SYRINGE | INTRAMUSCULAR | Status: DC | PRN
  Administered 2017-04-02 (×2): 0.5 mg via INTRAVENOUS
  Filled 2017-03-31 (×3): qty 10

## 2017-03-31 MED ORDER — PREMIER PROTEIN SHAKE
11.0000 [oz_av] | Freq: Three times a day (TID) | ORAL | Status: DC
  Administered 2017-03-31 – 2017-04-03 (×7): 11 [oz_av] via ORAL

## 2017-03-31 MED ORDER — ADULT MULTIVITAMIN W/MINERALS CH
1.0000 | ORAL_TABLET | Freq: Every day | ORAL | Status: DC
  Administered 2017-03-31 – 2017-04-08 (×8): 1 via ORAL
  Filled 2017-03-31 (×8): qty 1

## 2017-03-31 NOTE — Progress Notes (Signed)
Nutrition Follow Up Note   DOCUMENTATION CODES:   Morbid obesity  INTERVENTION:   Premier Protein TID, each supplement provides 160 kcal and 30 grams of protein.   MVI  Recommend obtain new weight   NUTRITION DIAGNOSIS:   Inadequate oral intake related to inability to eat (AMS) as evidenced by meal completion < 25%.  -improving   GOAL:   Patient will meet greater than or equal to 90% of their needs  MONITOR:   PO intake, Supplement acceptance, Labs, Weight trends  ASSESSMENT:   56 yo female with PMH of OSA, Schizoaffective disorder, mental disability, GERD [facility] with hypoglycemia, hypothermia, acute encephalopathy, and poor PO intake.  Pt mental status improved; pt is alert and able to eat. Pt eating 85% of meals yesterday. No new weight since admit; will request new weight. RD will order Premier Protein to help pt meet estimated protein needs as pt is morbidly obese. Pt with low Magnesium and Phosphorus on 10/27; spoke to pharmacy, recommend recheck labs as pt is likely refeeding.     Medications reviewed and include: lovenox, solu-cortef  Labs reviewed: Cl 113(H), BUN 26(H) P- 2.3(L), Mg 1.4(L)- 10/27  Diet Order:  Diet Heart Room service appropriate? Yes; Fluid consistency: Thin  EDUCATION NEEDS:   Not appropriate for education at this time  Skin:  Reviewed RN Assessment  Last BM:  10/29- type 6  Height:   Ht Readings from Last 1 Encounters:  03/24/17 5\' 2"  (1.575 m)    Weight:   Wt Readings from Last 1 Encounters:  03/24/17 (!) 317 lb (143.8 kg)    Ideal Body Weight:  50 kg  BMI:  Body mass index is 57.98 kg/m.  Estimated Nutritional Needs:   Kcal:  2000-2300kcal/day   Protein:  >144g/day   Fluid:  >2L/day   Betsey Holidayasey Delayne Sanzo MS, RD, LDN Pager #[phone] After Hours Pager: [phone]

## 2017-03-31 NOTE — Consult Note (Signed)
Cardiology Consultation:   Patient ID: Sarah Hurst; [id number]; September 26, 1960   Admit date: 03/24/2017 Date of Consult: 03/31/2017  Primary Care Provider: System, Pcp Not In Primary Cardiologist: New to [facility] - consult by Arida   Patient Profile:   Sarah Hurst is a 56 y.o. female with a hx of schizoaffective disorder with suicidal ideations, chronic pain disorder, ans sleep apnea who is being seen today for the evaluation of bradycardia at the request of Dr. Manuella Ghazi, MD.  History of Present Illness:   Sarah Hurst has no previously known cardiac history. She lives in a group home, though details are unclear. She has had multiple ED visits dating back to 11/13/16 (first visit) for chronic pain and suicidal ideations. It is unclear if she recently moved to the area or why there has been a sudden spike in her ED visits if she has been local prior to 11/2016. Prior EKG from 11/25/2016 documented sinus bradycardia with heart rate of 54 bpm. Recently evaluated by neurology and psychiatry for multiple falls and somnolence with MRI brain and CT head being negative. Symptoms were felt to be psych related, though did not require inpatient psychiatric admission. She was admitted to [facility] 10/25 with frequent falls and was noted to be hypoglycemic, hypothermic, and hypotensive concern for clinical sepsis of uncertain etiology. Required admission in the ICU for pressor support along with multiple doses of D50 and had a rectal temperature of 91.2 F. Heart rate during her admission had ranged from the low 50s to 60s bpm until the morning of 10/28 at 5 AM when she was noted on documented vital signs to have a heart rate in the 40s bpm with a low of 35 bpm at 7 AM. Telemetry has shown heart rates ranging from 50s to 60s in the ICU initially and more recently sinus bradycardia in the 30s to 40s bpm. Labs show a potassium of 4.1 on 10/28, magnesium 1.4 on 10/27 s/p repletion in the ICU, UDS negative, TSH normal on  10/25, HGB stable. Not on any rate-controlling medications. Cardiology asked to evaluate. Patient reports feeling weak for the past several months. She cannot further describe this weakness and does not answer any other questions during our visit today.    Past Medical History:  Diagnosis Date  . Asthma   . GERD (gastroesophageal reflux disease)   . Mental disability   . Schizoaffective disorder, bipolar type ([place])   . Sleep apnea     Past Surgical History:  Procedure Laterality Date  . WRIST SURGERY       Home Meds: Prior to Admission medications   Medication Sig Start Date End Date Taking? Authorizing Provider  acetaminophen (TYLENOL) 500 MG tablet Take 1,000 mg by mouth every 4 (four) hours as needed for moderate pain, fever or headache.   Yes [provider]  albuterol (PROVENTIL HFA;VENTOLIN HFA) 108 (90 Base) MCG/ACT inhaler Inhale 2 puffs into the lungs every 4 (four) hours as needed for wheezing or shortness of breath.   Yes [provider]  benztropine (COGENTIN) 1 MG tablet Take 1 mg by mouth 2 (two) times daily.   Yes [provider]  famotidine (PEPCID) 20 MG tablet Take 20 mg by mouth daily.   Yes [provider]  insulin lispro (HUMALOG) 100 UNIT/ML injection Inject 0-10 Units into the skin 4 (four) times daily -  before meals and at bedtime. Patient takes per sliding scale; bg 70-149: 0 units, bg 150-200: 2 units, bg 201-250:  4 units, bg 251-300: 6 units, bg 301-350: 8 units, bg 351-400: 10 units, bg 401+: 12 units and contact medical doctor    Yes [provider]  lactobacillus acidophilus (BACID) TABS tablet Take 1 tablet by mouth 3 (three) times daily.   Yes [provider]  loperamide (ANTI-DIARRHEAL) 2 MG capsule Take 2 mg by mouth daily as needed for diarrhea or loose stools.   Yes [provider]  Multiple Vitamins-Minerals (THERA-M PO) Take 1 tablet by mouth daily.   Yes [provider]    ondansetron (ZOFRAN) 4 MG tablet Take 4 mg by mouth every 6 (six) hours as needed for nausea or vomiting.   Yes [provider]  paliperidone (INVEGA SUSTENNA) 156 MG/ML SUSP injection Inject 156 mg into the muscle every 30 (thirty) days.   Yes [provider]  risperiDONE (RISPERDAL) 2 MG tablet Take 2 mg by mouth 2 (two) times daily.    Yes [provider]  zolpidem (AMBIEN) 5 MG tablet Take 5 mg by mouth at bedtime as needed for sleep.   Yes [provider]    Inpatient Medications: Scheduled Meds: . chlorhexidine gluconate (MEDLINE KIT)  15 mL Mouth Rinse BID  . dextrose  1 ampule Intravenous Once  . dextrose  1 ampule Intravenous Once  . enoxaparin (LOVENOX) injection  40 mg Subcutaneous Q12H  . hydrocortisone sod succinate (SOLU-CORTEF) inj  50 mg Intravenous Q6H  . mouth rinse  15 mL Mouth Rinse QID   Continuous Infusions: . dextrose 25 mL/hr at 03/30/17 1449   PRN Meds: acetaminophen **OR** acetaminophen  Allergies:  No Known Allergies  Social History:   Social History   Social History  . Marital status: Single    Spouse name: N/A  . Number of children: N/A  . Years of education: N/A   Occupational History  . Not on file.   Social History Main Topics  . Smoking status: Former Research scientist (life sciences)  . Smokeless tobacco: Never Used  . Alcohol use No  . Drug use: No  . Sexual activity: Not on file   Other Topics Concern  . Not on file   Social History Narrative  . No narrative on file     Family History:  No family history on file. - patient refuses to answer family history questions during cardiology consultation. There is no family present to obtain further details. She does not know what group home she lives in.   ROS:  Review of Systems  Unable to perform ROS: Psychiatric disorder      Physical Exam/Data:   Vitals:   03/30/17 1558 03/30/17 2003 03/31/17 0434 03/31/17 0900  BP:  (!) 117/43 124/72 140/85  Pulse: (!) 40 (!) 47  (!) 42 (!) 45  Resp:  16 20 13   Temp: (!) 96.8 F (36 C) (!) 97.4 F (36.3 C) 98.2 F (36.8 C) 98.5 F (36.9 C)  TempSrc: Axillary Oral Oral Axillary  SpO2:  100% 99% 100%  Weight:      Height:        Intake/Output Summary (Last 24 hours) at 03/31/17 0933 Last data filed at 03/31/17 0523  Gross per 24 hour  Intake              409 ml  Output              500 ml  Net              -91 ml   [employer]  03/24/17 1210  Weight: (!) 317 lb (143.8 kg)   Body mass index is 57.98 kg/m.   Physical Exam: General: Well developed, well nourished, in no acute distress. Head: Normocephalic, atraumatic, sclera non-icteric, no xanthomas, nares without discharge. Neck: Negative for carotid bruits. JVD not elevated. Lungs: Clear bilaterally to auscultation without wheezes, rales, or rhonchi. Breathing is unlabored. Heart: Bradycardic with S1 S2. No murmurs, rubs, or gallops appreciated. Abdomen: Soft, non-tender, non-distended with normoactive bowel sounds. No hepatomegaly. No rebound/guarding. No obvious abdominal masses. Msk:  Strength and tone appear normal for age. Extremities: No clubbing or cyanosis. No edema. Distal pedal pulses are 2+ and equal bilaterally. Neuro: Alert, not oriented to person, place, or time. No facial asymmetry. No focal deficit. Psych:  Somnolent.   EKG:  The EKG was personally reviewed and demonstrates: 03/24/2017 - sinus bradycardia, 51 bpm, rare PAC, QTc 526 msec, no acute st/t changes Telemetry:  Telemetry was personally reviewed and demonstrates: heart rates ranging from 50s to 60s in the ICU initially and more recently sinus bradycardia in the 30s to 40s bpm. Heart rate currently 50 bpm.   Weights: Filed Weights   03/24/17 1210  Weight: (!) 317 lb (143.8 kg)    Relevant CV Studies: Echo 03/29/2017: Study Conclusions  - Left ventricle: The cavity size was normal. Systolic function was   normal. The estimated ejection fraction was in the range of  60%   to 65%. Wall motion was normal; there were no regional wall   motion abnormalities. Doppler parameters are consistent with   abnormal left ventricular relaxation (grade 1 diastolic   dysfunction). - Left atrium: The atrium was normal in size. - Right ventricle: Systolic function was normal. - Pulmonary arteries: Systolic pressure was mildly elevated. PA   peak pressure: 43 mm Hg (S).  Laboratory Data:  Chemistry Recent Labs Lab 03/28/17 0410 03/29/17 0355 03/31/17 0448  NA 148* 146* 145  K 4.1 4.3 4.1  CL 119* 113* 113*  CO2 28 27 27   GLUCOSE 91 163* 109*  BUN 12 12 26*  CREATININE 1.12* 0.80 0.83  CALCIUM 8.1* 8.4* 9.2  GFRNONAA 53* >60 >60  GFRAA >60 >60 >60  ANIONGAP 1* 6 5     Recent Labs Lab 03/26/17 1750 03/27/17 0817 03/28/17 0410  PROT 5.2* 5.8* 5.4*  ALBUMIN 2.4* 2.6* 2.4*  AST 64* 61* 50*  ALT 42 47 41  ALKPHOS 44 60 51  BILITOT 0.6 0.5 0.3   Hematology Recent Labs Lab 03/27/17 0817 03/28/17 0410 03/29/17 0355  WBC 3.6 5.6 6.5  RBC 3.95 3.72* 3.56*  HGB 11.3* 10.5* 10.4*  HCT 35.6 33.7* 32.4*  MCV 90.3 90.4 91.0  MCH 28.5 28.2 29.2  MCHC 31.6* 31.1* 32.1  RDW 16.8* 17.3* 17.3*  PLT 99* 101* 80*   Cardiac Enzymes Recent Labs Lab 03/24/17 1258  TROPONINI <0.03   No results for input(s): TROPIPOC in the last 168 hours.  BNPNo results for input(s): BNP, PROBNP in the last 168 hours.  DDimer No results for input(s): DDIMER in the last 168 hours.  Radiology/Studies:  Ct Head Wo Contrast  Result Date: 03/28/2017 IMPRESSION: Negative head CT. Electronically Signed   By: Inge Rise M.D.   On: 03/28/2017 14:47   Dg Chest Port 1 View  Result Date: 03/27/2017 IMPRESSION: 1. Right internal jugular central venous catheter terminates in the middle third of the superior vena cava. No pneumothorax. 2. Stable hazy curvilinear right parahilar lung opacities, favor scarring or atelectasis. Electronically  Signed   By: Ilona Sorrel M.D.    On: 03/27/2017 13:50    Assessment and Plan:   1. Bradycardia: -Heart rate documented in the 50s bpm dating back to 11/2016 (oldest EKG on file) -Has known sleep apnea. May have been falling asleep in her room this morning while laying in the bed -Not on any rate limiting medications -Potassium not elevated and TSH normal -Would recommend to ambulate patient when able to assess for appropriate chronotropic response -There does not appear to be any high-grade block at this time -Would avoid rate limiting medications -Consider pacer pads, if patient would wear them -PRN atropine  -No indication for urgent temp wire placement at this time -Echo as above  2. Sleep apnea: -Recommend CPAP, if patient is able/would wear   For questions or updates, please contact [place] Please consult [email or web address] for contact info under Cardiology/STEMI.   Signed, Christell Faith, PA-C [place] Pager: [phone] 03/31/2017, 9:33 AM

## 2017-03-31 NOTE — Progress Notes (Signed)
Patient ID: Sarah Hurst, female   DOB: Oct 16, 1960, 56 y.o.   MRN: [id number]  [facility] PROGRESS NOTE  Sarah Hurst SNK:[id number] DOB: [birth year] DOA: 03/24/2017 PCP: System, Pcp Not In  HPI/Subjective: Sleepy, not wanting to talk, has been having periods of pauses on telemetry, also bradycardic  Objective: Vitals:   03/31/17 1432 03/31/17 1611  BP: 132/77   Pulse: (!) 42 (!) 42  Resp: 13   Temp: (!) 97.5 F (36.4 C)   SpO2: 100% 98%    Filed Weights   03/24/17 1210 03/31/17 1051  Weight: (!) 143.8 kg (317 lb) (!) 152.4 kg (336 lb)    ROS: Review of Systems  Unable to perform ROS: Acuity of condition  Respiratory: Negative for cough.   Cardiovascular: Negative for chest pain.  Gastrointestinal: Negative for abdominal pain, constipation, nausea and vomiting.  Musculoskeletal: Negative for back pain and neck pain.  Neurological: Positive for weakness. Negative for dizziness.   Exam: Physical Exam  HENT:  Nose: No mucosal edema.  Mouth/Throat: No oropharyngeal exudate.  Unable to look in her mouth  Eyes: EOM and lids are normal.  Pupils pinpoint  Neck: Carotid bruit is not present.  Cardiovascular: Regular rhythm, S1 normal, S2 normal and normal heart sounds.   Respiratory: No respiratory distress. She has decreased breath sounds in the right lower field and the left lower field. She has no wheezes. She has no rhonchi. She has no rales.  GI: Soft. Bowel sounds are normal. There is no tenderness.  Musculoskeletal:       Right ankle: She exhibits swelling.       Left ankle: She exhibits swelling.  Neurological: She is alert.  Able to wiggle toes and feet herself with a fork.  Skin: Skin is warm. No rash noted. Nails show no clubbing.  Psychiatric: She has a normal mood and affect. She is not actively hallucinating.      Data Reviewed: Basic Metabolic Panel:  Recent Labs Lab 03/26/17 1750 03/27/17 0817 03/28/17 0410 03/29/17 0355 03/31/17 0448   NA 148* 147* 148* 146* 145  K 3.3* 3.4* 4.1 4.3 4.1  CL 117* 113* 119* 113* 113*  CO2 24 29 28 27 27   GLUCOSE 74 85 91 163* 109*  BUN 9 10 12 12  26*  CREATININE 0.49 0.61 1.12* 0.80 0.83  CALCIUM 8.0* 8.9 8.1* 8.4* 9.2  MG  --   --   --  1.4*  --   PHOS  --   --   --  2.3*  --    Liver Function Tests:  Recent Labs Lab 03/26/17 1750 03/27/17 0817 03/28/17 0410  AST 64* 61* 50*  ALT 42 47 41  ALKPHOS 44 60 51  BILITOT 0.6 0.5 0.3  PROT 5.2* 5.8* 5.4*  ALBUMIN 2.4* 2.6* 2.4*   No results for input(s): AMMONIA in the last 168 hours. CBC:  Recent Labs Lab 03/26/17 1750 03/27/17 0817 03/28/17 0410 03/29/17 0355  WBC 4.0 3.6 5.6 6.5  NEUTROABS  --   --  4.0  --   HGB 10.7* 11.3* 10.5* 10.4*  HCT 34.2* 35.6 33.7* 32.4*  MCV 90.4 90.3 90.4 91.0  PLT 104* 99* 101* 80*   Cardiac Enzymes: No results for input(s): CKTOTAL, CKMB, CKMBINDEX, TROPONINI in the last 168 hours. BNP (last 3 results)  Recent Labs  11/25/16 0110  BNP 91.0     CBG:  Recent Labs Lab 03/31/17 0015 03/31/17 0427 03/31/17 0736 03/31/17 1142 03/31/17 1646  GLUCAP 103*  117* 108* 121* 106*    Recent Results (from the past 240 hour(s))  MRSA PCR Screening     Status: None   Collection Time: 03/27/17 12:17 PM  Result Value Ref Range Status   MRSA by PCR NEGATIVE NEGATIVE Final    Comment:        The GeneXpert MRSA Assay (FDA approved for NASAL specimens only), is one component of a comprehensive MRSA colonization surveillance program. It is not intended to diagnose MRSA infection nor to guide or monitor treatment for MRSA infections.   CULTURE, BLOOD (ROUTINE X 2) w Reflex to ID Panel     Status: Abnormal   Collection Time: 03/27/17  1:51 PM  Result Value Ref Range Status   Specimen Description BLOOD RIGHT HAND  Final   Special Requests   Final    BOTTLES DRAWN AEROBIC AND ANAEROBIC Blood Culture adequate volume   Culture  Setup Time   Final    GRAM POSITIVE COCCI AEROBIC  BOTTLE ONLY CRITICAL RESULT CALLED TO, READ BACK BY AND VERIFIED WITH: LISA CLUTTS AT 1605 03/28/17. MSS    Culture (A)  Final    STAPHYLOCOCCUS SPECIES (COAGULASE NEGATIVE) THE SIGNIFICANCE OF ISOLATING THIS ORGANISM FROM A SINGLE SET OF BLOOD CULTURES WHEN MULTIPLE SETS ARE DRAWN IS UNCERTAIN. PLEASE NOTIFY THE MICROBIOLOGY DEPARTMENT WITHIN ONE WEEK IF SPECIATION AND SENSITIVITIES ARE REQUIRED. Performed at [facility], [place] [address] [place], [place] [address]    Report Status 03/30/2017 FINAL  Final  Blood Culture ID Panel (Reflexed)     Status: Abnormal   Collection Time: 03/27/17  1:51 PM  Result Value Ref Range Status   Enterococcus species NOT DETECTED NOT DETECTED Final   Listeria monocytogenes NOT DETECTED NOT DETECTED Final   Staphylococcus species DETECTED (A) NOT DETECTED Final    Comment: Methicillin (oxacillin) susceptible coagulase negative staphylococcus. Possible blood culture contaminant (unless isolated from more than one blood culture draw or clinical case suggests pathogenicity). No antibiotic treatment is indicated for blood  culture contaminants. CRITICAL RESULT CALLED TO, READ BACK BY AND VERIFIED WITH: LISA CLUTTS AT 1605 03/28/17. MSS    Staphylococcus aureus NOT DETECTED NOT DETECTED Final   Methicillin resistance NOT DETECTED NOT DETECTED Final   Streptococcus species NOT DETECTED NOT DETECTED Final   Streptococcus agalactiae NOT DETECTED NOT DETECTED Final   Streptococcus pneumoniae NOT DETECTED NOT DETECTED Final   Streptococcus pyogenes NOT DETECTED NOT DETECTED Final   Acinetobacter baumannii NOT DETECTED NOT DETECTED Final   Enterobacteriaceae species NOT DETECTED NOT DETECTED Final   Enterobacter cloacae complex NOT DETECTED NOT DETECTED Final   Escherichia coli NOT DETECTED NOT DETECTED Final   Klebsiella oxytoca NOT DETECTED NOT DETECTED Final   Klebsiella pneumoniae NOT DETECTED NOT DETECTED Final   Proteus species NOT DETECTED NOT  DETECTED Final   Serratia marcescens NOT DETECTED NOT DETECTED Final   Haemophilus influenzae NOT DETECTED NOT DETECTED Final   Neisseria meningitidis NOT DETECTED NOT DETECTED Final   Pseudomonas aeruginosa NOT DETECTED NOT DETECTED Final   Candida albicans NOT DETECTED NOT DETECTED Final   Candida glabrata NOT DETECTED NOT DETECTED Final   Candida krusei NOT DETECTED NOT DETECTED Final   Candida parapsilosis NOT DETECTED NOT DETECTED Final   Candida tropicalis NOT DETECTED NOT DETECTED Final  Urine Culture     Status: None   Collection Time: 03/27/17  2:41 PM  Result Value Ref Range Status   Specimen Description URINE, RANDOM  Final   Special  Requests NONE  Final   Culture   Final    NO GROWTH Performed at [facility], [place] [address] [place], [place] [address]    Report Status 03/28/2017 FINAL  Final  CULTURE, BLOOD (ROUTINE X 2) w Reflex to ID Panel     Status: None (Preliminary result)   Collection Time: 03/27/17  3:10 PM  Result Value Ref Range Status   Specimen Description BLOOD BLOOD RIGHT HAND  Final   Special Requests   Final    BOTTLES DRAWN AEROBIC AND ANAEROBIC Blood Culture adequate volume   Culture NO GROWTH 4 DAYS  Final   Report Status PENDING  Incomplete     Studies: No results found.  Scheduled Meds: . chlorhexidine gluconate (MEDLINE KIT)  15 mL Mouth Rinse BID  . dextrose  1 ampule Intravenous Once  . dextrose  1 ampule Intravenous Once  . enoxaparin (LOVENOX) injection  40 mg Subcutaneous Q12H  . hydrocortisone sod succinate (SOLU-CORTEF) inj  50 mg Intravenous Q6H  . mouth rinse  15 mL Mouth Rinse QID  . multivitamin with minerals  1 tablet Oral Daily  . protein supplement shake  11 oz Oral TID BM   Continuous Infusions: . dextrose 25 mL/hr at 03/30/17 1449    Assessment/Plan:  1. Bradycardia -heart rate dropping in 30s -Appreciate cardiology input, will transfer her to telemetry for close monitoring -This is likely due to underlying  sleep apnea 2. Clinical sepsis: Present on admission but source unclear.  Patient off pressors for the second day.  Hypotension improved.  Hypothermia improved.  Empiric antibiotics.  Staph species in 1 blood culture bottle likely a contamination.  Pro-calcitonin negative x3. The patient placed on stress dose steroids and will need tapering of this. 3. Hypoglycemia.  On low-dose D5 drip.  Now that the patient is eating, this likely can be discontinued. 4. Acute encephalopathy: sleepy today 5. History of schizoaffective disorder.  Can consider restarting psychiatric medications if she is able to take it 6. History of sleep apnea 7. History of GERD 8. Thrombocytopenia.  Continue to monitor closely. 9. Hypomagnesemia.  Replete and recheck  Code Status:     Code Status Orders        Start     Ordered   03/27/17 1700  Full code  Continuous     03/27/17 1659    Code Status History    Date Active Date Inactive Code Status Order ID Comments User Context   01/24/2017  5:35 PM 01/28/2017  8:51 PM Full Code [id number]  Julianne Rice, MD ED   01/10/2017 10:02 PM 01/11/2017  6:32 PM Full Code [id number]  Daleen Bo, MD ED   12/26/2016  5:18 PM 12/26/2016 11:20 PM Full Code [id number]  Julianne Rice, MD ED   12/13/2016  3:16 PM 12/14/2016 11:02 PM Full Code [id number]  Noemi Chapel, MD ED     Family Communication: No family at bedside Disposition Plan: To be determined -Education officer, museum aware and working on placement  Consultants:  Critical care specialist  Neurology  Psychiatry  Antibiotics:  None  Time spent: 28 minutes  [place]

## 2017-03-31 NOTE — Consult Note (Signed)
[facility] Psychiatry Consult   Reason for Consult:  Consult for 56 year old woman with schizophrenia currently on the intensive care unit with hypoglycemia Referring Physician:  Gouru Patient Identification: Sarah Hurst MRN:  [id number] Principal Diagnosis: Schizoaffective disorder, bipolar type [facility]) Diagnosis:   Patient Active Problem List   Diagnosis Date Noted  . Hypercarbia [R06.89]   . Hypoglycemia [E16.2] 03/27/2017  . Schizoaffective disorder, bipolar type ([place]) [F25.0] 03/24/2017    Total Time spent with patient: 15 minutes  Subjective:   Sarah Hurst is a 56 y.o. female patient admitted with patient not able to give information.  Follow-up for Monday the 29th.  Patient seen.  She is no longer in the intensive care unit.  I spoke with nursing as well.  Patient was awake and seemed able to hear me but would not answer questions directly and mostly ignored my attempts to talk with her.  Nursing tells me she has behave the same way with them although at one point earlier today she did request food.  Patient's affect is flat.  Medically she seems to be somewhat improved however.  Very difficult to assess mental state given that she is not responsive verbally right now although she does seem to be awake.  HPI:  This is a 56 year old woman with a history of schizophrenia who came to the emergency room initially with medical complaints but then became more confused. Neurologic workup was negative. Patient has stopped eating and become less and less responsive. Currently the patient is in the intensive care unit and is not responding verbally to me. Hard to tell if she is listening or how awake she is. Patient had been continued on her oral antipsychotics until yesterday. She is normally also on long-acting injectable medicine but it is not known when the last dosage was given.  Social history: Patient has a legal guardian. Lives in a group home.  Medical history: Patient has  diabetes. Overweight. Currently hypoglycemic not eating.  Substance abuse history: None known  Past Psychiatric History: Patient has a long history of schizophrenia multiple prior hospitalizations. Had been seen several times at the psychiatric facilities of our system recently. Often with physical complaints. Usually was able to be sent back home to her group home without difficulty. Unknown if she has been suicidal in the past.  Risk to Self: Is patient at risk for suicide?: No Risk to Others:   Prior Inpatient Therapy:   Prior Outpatient Therapy:    Past Medical History:  Past Medical History:  Diagnosis Date  . Asthma   . GERD (gastroesophageal reflux disease)   . Mental disability   . Schizoaffective disorder, bipolar type ([place])   . Sleep apnea     Past Surgical History:  Procedure Laterality Date  . WRIST SURGERY     Family History: History reviewed. No pertinent family history. Family Psychiatric  History: Unknown Social History:  History  Alcohol Use  . Yes    Comment: 1 cup of wine occasionally     History  Drug Use No    Social History   Social History  . Marital status: Single    Spouse name: N/A  . Number of children: N/A  . Years of education: N/A   Social History Main Topics  . Smoking status: Former Research scientist (life sciences)  . Smokeless tobacco: Never Used  . Alcohol use Yes     Comment: 1 cup of wine occasionally  . Drug use: No  . Sexual activity: Not Asked  Other Topics Concern  . None   Social History Narrative  . None   Additional Social History:    Allergies:  No Known Allergies  Labs:  Results for orders placed or performed during the hospital encounter of 03/24/17 (from the past 48 hour(s))  Glucose, capillary     Status: None   Collection Time: 03/30/17  4:10 AM  Result Value Ref Range   Glucose-Capillary 81 65 - 99 mg/dL  Glucose, capillary     Status: None   Collection Time: 03/30/17  7:59 AM  Result Value Ref Range   Glucose-Capillary  75 65 - 99 mg/dL  Glucose, capillary     Status: None   Collection Time: 03/30/17 12:02 PM  Result Value Ref Range   Glucose-Capillary 77 65 - 99 mg/dL  Glucose, capillary     Status: None   Collection Time: 03/30/17  4:53 PM  Result Value Ref Range   Glucose-Capillary 88 65 - 99 mg/dL   Comment 1 Notify RN   Glucose, capillary     Status: Abnormal   Collection Time: 03/30/17  7:59 PM  Result Value Ref Range   Glucose-Capillary 166 (H) 65 - 99 mg/dL  Glucose, capillary     Status: Abnormal   Collection Time: 03/31/17 12:15 AM  Result Value Ref Range   Glucose-Capillary 103 (H) 65 - 99 mg/dL   Comment 1 Notify RN   Glucose, capillary     Status: Abnormal   Collection Time: 03/31/17  4:27 AM  Result Value Ref Range   Glucose-Capillary 117 (H) 65 - 99 mg/dL   Comment 1 Notify RN   Basic metabolic panel     Status: Abnormal   Collection Time: 03/31/17  4:48 AM  Result Value Ref Range   Sodium 145 135 - 145 mmol/L   Potassium 4.1 3.5 - 5.1 mmol/L   Chloride 113 (H) 101 - 111 mmol/L   CO2 27 22 - 32 mmol/L   Glucose, Bld 109 (H) 65 - 99 mg/dL   BUN 26 (H) 6 - 20 mg/dL   Creatinine, Ser 0.83 0.44 - 1.00 mg/dL   Calcium 9.2 8.9 - 10.3 mg/dL   GFR calc non Af Amer >60 >60 mL/min   GFR calc Af Amer >60 >60 mL/min    Comment: (NOTE) The eGFR has been calculated using the CKD EPI equation. This calculation has not been validated in all clinical situations. eGFR's persistently <60 mL/min signify possible Chronic Kidney Disease.    Anion gap 5 5 - 15  Glucose, capillary     Status: Abnormal   Collection Time: 03/31/17  7:36 AM  Result Value Ref Range   Glucose-Capillary 108 (H) 65 - 99 mg/dL   Comment 1 Notify RN   Glucose, capillary     Status: Abnormal   Collection Time: 03/31/17 11:42 AM  Result Value Ref Range   Glucose-Capillary 121 (H) 65 - 99 mg/dL   Comment 1 Notify RN   Glucose, capillary     Status: Abnormal   Collection Time: 03/31/17  4:46 PM  Result Value Ref  Range   Glucose-Capillary 106 (H) 65 - 99 mg/dL   Comment 1 Notify RN     Current Facility-Administered Medications  Medication Dose Route Frequency Provider Last Rate Last Dose  . acetaminophen (TYLENOL) tablet 650 mg  650 mg Oral Q6H PRN Gouru, Aruna, MD   650 mg at 03/31/17 1019   Or  . acetaminophen (TYLENOL) suppository 650 mg  650 mg  Rectal Q6H PRN Gouru, Aruna, MD      . atropine 1 MG/10ML injection 0.5 mg  0.5 mg Intravenous PRN Manuella Ghazi, Vipul, MD      . chlorhexidine gluconate (MEDLINE KIT) (PERIDEX) 0.12 % solution 15 mL  15 mL Mouth Rinse BID Awilda Bill, NP   15 mL at 03/31/17 2000  . dextrose 50 % solution 50 mL  1 ampule Intravenous Once Sung, Jade J, MD      . dextrose 50 % solution 50 mL  1 ampule Intravenous Once Awilda Bill, NP      . enoxaparin (LOVENOX) injection 40 mg  40 mg Subcutaneous Q12H Lenis Noon, RPH   40 mg at 03/31/17 1018  . hydrocortisone sodium succinate (SOLU-CORTEF) 100 MG injection 50 mg  50 mg Intravenous Q6H Gouru, Aruna, MD   50 mg at 03/31/17 1521  . MEDLINE mouth rinse  15 mL Mouth Rinse QID Awilda Bill, NP   15 mL at 03/31/17 1521  . multivitamin with minerals tablet 1 tablet  1 tablet Oral Daily Max Sane, MD   1 tablet at 03/31/17 1147  . protein supplement (PREMIER PROTEIN) liquid  11 oz Oral TID BM Max Sane, MD   11 oz at 03/31/17 1400    Musculoskeletal: Strength & Muscle Tone: decreased Gait & Station: unable to stand Patient leans: N/A  Psychiatric Specialty Exam: Physical Exam  Nursing note and vitals reviewed. Constitutional: She appears well-developed and well-nourished.  HENT:  Head: Normocephalic and atraumatic.  Eyes: Pupils are equal, round, and reactive to light. Conjunctivae are normal.  Neck: Normal range of motion.  Cardiovascular: Normal heart sounds.   Respiratory: Effort normal.  GI: Soft.  Musculoskeletal: Normal range of motion.  Skin: Skin is warm and dry.  Psychiatric: Her affect is blunt.  She is noncommunicative.    Review of Systems  Unable to perform ROS: Patient unresponsive    Blood pressure (!) 139/100, pulse (!) 37, temperature 98.1 F (36.7 C), temperature source Oral, resp. rate 17, height _0  (1.575 m), weight (!) 152.4 kg (336 lb), SpO2 99 %.Body mass index is 61.46 kg/m.  General Appearance: Disheveled  Eye Contact:  None  Speech:  Negative  Volume:  Decreased  Mood:  Negative  Affect:  Negative  Thought Process:  NA  Orientation:  Negative  Thought Content:  Negative  Suicidal Thoughts:  No  Homicidal Thoughts:  No  Memory:  Negative  Judgement:  Negative  Insight:  Negative  Psychomotor Activity:  Negative  Concentration:  Concentration: Negative  Recall:  Negative  Fund of Knowledge:  Negative  Language:  Negative  Akathisia:  Negative  Handed:  Right  AIMS (if indicated):     Assets:  Social Support  ADL's:  Impaired  Cognition:  Impaired,  Severe  Sleep:        Treatment Plan Summary: Daily contact with patient to assess and evaluate symptoms and progress in treatment, Medication management and Plan Patient with schizophrenia.  Was in the intensive care unit with a shock like picture of unclear etiology.  Now out on the floor.  More hemodynamically stable but still flat affect and minimal speech.  Unclear how much of this is psychiatric however I am still going to defer restarting psychiatric medicines at this point given how difficult it is to distinguish the delirium and confusion from schizophrenia.  I will continue to follow up.  Disposition: No evidence of imminent risk to self or others  at present.   Patient does not meet criteria for psychiatric inpatient admission. Supportive therapy provided about ongoing stressors.  Alethia Berthold, MD 03/31/2017 8:55 PM

## 2017-03-31 NOTE — H&P (Signed)
Request that pt be moved to cardiac floor.  Dr. Sherryll BurgerShah advised at this time (per cardiology) that pt can be transferred if she continues to have cardiac pauses over 3 seconds or we receive multiple calls from telemetry.    Pacer Pads placed and CPAP to be placed at bedside.  Will continue to monitor.

## 2017-03-31 NOTE — Progress Notes (Signed)
Pt arrived to the unit via bed with Maricar, RN and tech, pt arrived in stable condition, VSS, CPAP at bedside, Pacer pads on pts chest, pt stable no s/s of distress or discomfort, all safety measures in place, will continue to monitor.

## 2017-03-31 NOTE — H&P (Signed)
Received call from telemetry stating that pt continues to be brady and that cardiac pauses are becoming more frequent and longer.  Informed Dr. Sherryll BurgerShah.  Orders received.   Will continue to monitor.

## 2017-03-31 NOTE — Progress Notes (Signed)
[place] INFECTIOUS DISEASE PROGRESS NOTE Date of Admission:  03/24/2017     ID: Sarah Hurst is a 56 y.o. female with AMS Principal Problem:   Schizoaffective disorder, bipolar type ([place]) Active Problems:   Hypoglycemia   Hypercarbia   Subjective: Out of unit, more alert, no fevers  ROS  Unable to obtain  Medications:  Antibiotics Given (last 72 hours)    Date/Time Action Medication Dose Rate   03/28/17 2244 New Bag/Given   piperacillin-tazobactam (ZOSYN) IVPB 3.375 g 3.375 g 12.5 mL/hr   03/29/17 0601 New Bag/Given   piperacillin-tazobactam (ZOSYN) IVPB 3.375 g 3.375 g 12.5 mL/hr   03/29/17 1333 New Bag/Given   piperacillin-tazobactam (ZOSYN) IVPB 3.375 g 3.375 g 12.5 mL/hr   03/29/17 2123 New Bag/Given   piperacillin-tazobactam (ZOSYN) IVPB 3.375 g 3.375 g 12.5 mL/hr   03/30/17 1230 New Bag/Given  [given in CCU by other; pt transferred with this running upon transfer]   piperacillin-tazobactam (ZOSYN) IVPB 3.375 g 3.375 g 12.5 mL/hr     . chlorhexidine gluconate (MEDLINE KIT)  15 mL Mouth Rinse BID  . dextrose  1 ampule Intravenous Once  . dextrose  1 ampule Intravenous Once  . enoxaparin (LOVENOX) injection  40 mg Subcutaneous Q12H  . hydrocortisone sod succinate (SOLU-CORTEF) inj  50 mg Intravenous Q6H  . mouth rinse  15 mL Mouth Rinse QID  . multivitamin with minerals  1 tablet Oral Daily  . protein supplement shake  11 oz Oral TID BM    Objective: Vital signs in last 24 hours: Temp:  [97.4 F (36.3 C)-98.5 F (36.9 C)] 97.5 F (36.4 C) (10/29 1432) Pulse Rate:  [42-47] 42 (10/29 1432) Resp:  [13-20] 13 (10/29 1432) BP: (117-140)/(43-85) 132/77 (10/29 1432) SpO2:  [99 %-100 %] 100 % (10/29 1432) Weight:  [152.4 kg (336 lb)] 152.4 kg (336 lb) (10/29 1051) Physical Exam  Constitutional:  Awake but slowed mentation, sitting up in bed HENT: [place]/AT, PERRLA, no scleral icterus Mouth/Throat: Oropharynx is clear and moist. No oropharyngeal exudate.   Cardiovascular: Normal rate, regular rhythm and normal heart sounds. Exam reveals no gallop and no friction rub.  No murmur heard.  Pulmonary/Chest: Effort normal and breath sounds normal. No respiratory distress.  has no wheezes.  Neck = supple, no nuchal rigidity Abdominal: Soft. Bowel sounds are normal.  exhibits no distension. There is no tenderness.  Lymphadenopathy: no cervical adenopathy. No axillary adenopathy Neurological: awake but slowed mentation  Skin: Skin is warm and dry. No rash noted. No erythema.  Psychiatric: a normal mood and affect.  behavior is normal.    Lab Results  Recent Labs  03/29/17 0355 03/31/17 0448  WBC 6.5  --   HGB 10.4*  --   HCT 32.4*  --   NA 146* 145  K 4.3 4.1  CL 113* 113*  CO2 27 27  BUN 12 26*  CREATININE 0.80 0.83    Microbiology: Results for orders placed or performed during the hospital encounter of 03/24/17  MRSA PCR Screening     Status: None   Collection Time: 03/27/17 12:17 PM  Result Value Ref Range Status   MRSA by PCR NEGATIVE NEGATIVE Final    Comment:        The GeneXpert MRSA Assay (FDA approved for NASAL specimens only), is one component of a comprehensive MRSA colonization surveillance program. It is not intended to diagnose MRSA infection nor to guide or monitor treatment for MRSA infections.   CULTURE, BLOOD (ROUTINE X 2)  w Reflex to ID Panel     Status: Abnormal   Collection Time: 03/27/17  1:51 PM  Result Value Ref Range Status   Specimen Description BLOOD RIGHT HAND  Final   Special Requests   Final    BOTTLES DRAWN AEROBIC AND ANAEROBIC Blood Culture adequate volume   Culture  Setup Time   Final    GRAM POSITIVE COCCI AEROBIC BOTTLE ONLY CRITICAL RESULT CALLED TO, READ BACK BY AND VERIFIED WITH: LISA CLUTTS AT 1605 03/28/17. MSS    Culture (A)  Final    STAPHYLOCOCCUS SPECIES (COAGULASE NEGATIVE) THE SIGNIFICANCE OF ISOLATING THIS ORGANISM FROM A SINGLE SET OF BLOOD CULTURES WHEN MULTIPLE SETS  ARE DRAWN IS UNCERTAIN. PLEASE NOTIFY THE MICROBIOLOGY DEPARTMENT WITHIN ONE WEEK IF SPECIATION AND SENSITIVITIES ARE REQUIRED. Performed at [facility], [place] [address] [place], [place] [address]    Report Status 03/30/2017 FINAL  Final  Blood Culture ID Panel (Reflexed)     Status: Abnormal   Collection Time: 03/27/17  1:51 PM  Result Value Ref Range Status   Enterococcus species NOT DETECTED NOT DETECTED Final   Listeria monocytogenes NOT DETECTED NOT DETECTED Final   Staphylococcus species DETECTED (A) NOT DETECTED Final    Comment: Methicillin (oxacillin) susceptible coagulase negative staphylococcus. Possible blood culture contaminant (unless isolated from more than one blood culture draw or clinical case suggests pathogenicity). No antibiotic treatment is indicated for blood  culture contaminants. CRITICAL RESULT CALLED TO, READ BACK BY AND VERIFIED WITH: LISA CLUTTS AT 1605 03/28/17. MSS    Staphylococcus aureus NOT DETECTED NOT DETECTED Final   Methicillin resistance NOT DETECTED NOT DETECTED Final   Streptococcus species NOT DETECTED NOT DETECTED Final   Streptococcus agalactiae NOT DETECTED NOT DETECTED Final   Streptococcus pneumoniae NOT DETECTED NOT DETECTED Final   Streptococcus pyogenes NOT DETECTED NOT DETECTED Final   Acinetobacter baumannii NOT DETECTED NOT DETECTED Final   Enterobacteriaceae species NOT DETECTED NOT DETECTED Final   Enterobacter cloacae complex NOT DETECTED NOT DETECTED Final   Escherichia coli NOT DETECTED NOT DETECTED Final   Klebsiella oxytoca NOT DETECTED NOT DETECTED Final   Klebsiella pneumoniae NOT DETECTED NOT DETECTED Final   Proteus species NOT DETECTED NOT DETECTED Final   Serratia marcescens NOT DETECTED NOT DETECTED Final   Haemophilus influenzae NOT DETECTED NOT DETECTED Final   Neisseria meningitidis NOT DETECTED NOT DETECTED Final   Pseudomonas aeruginosa NOT DETECTED NOT DETECTED Final   Candida albicans NOT DETECTED NOT  DETECTED Final   Candida glabrata NOT DETECTED NOT DETECTED Final   Candida krusei NOT DETECTED NOT DETECTED Final   Candida parapsilosis NOT DETECTED NOT DETECTED Final   Candida tropicalis NOT DETECTED NOT DETECTED Final  Urine Culture     Status: None   Collection Time: 03/27/17  2:41 PM  Result Value Ref Range Status   Specimen Description URINE, RANDOM  Final   Special Requests NONE  Final   Culture   Final    NO GROWTH Performed at [facility], [address] [place], [place] [address]    Report Status 03/28/2017 FINAL  Final  CULTURE, BLOOD (ROUTINE X 2) w Reflex to ID Panel     Status: None (Preliminary result)   Collection Time: 03/27/17  3:10 PM  Result Value Ref Range Status   Specimen Description BLOOD BLOOD RIGHT HAND  Final   Special Requests   Final    BOTTLES DRAWN AEROBIC AND ANAEROBIC Blood Culture adequate volume  Culture NO GROWTH 4 DAYS  Final   Report Status PENDING  Incomplete    Studies/Results: No results found.  Assessment/Plan: 56 yo with a history of schizoaffective disorder, GERD, sleep apnea admitted with hypoglycemia, hypotension and hypothermia. WBC normal, UA neg and procalcitonin normal. No obvious source of infection at this point.  Treated with zosyn and now out of unit, off pressors and getting back to baseline. Still no fevers, wbc nml, cxs with 1/2 + CNS (likely contaminant).  Procalcitonin nml   Recommendations Would stop zosyn and monitor wbc, temperature and cultures  Thank you very much for the consult. Will follow with you.  Leonel Ramsay   03/31/2017, 4:07 PM

## 2017-04-01 DIAGNOSIS — G4733 Obstructive sleep apnea (adult) (pediatric): Secondary | ICD-10-CM

## 2017-04-01 LAB — GLUCOSE, CAPILLARY
GLUCOSE-CAPILLARY: 95 mg/dL (ref 65–99)
GLUCOSE-CAPILLARY: 100 mg/dL — AB (ref 65–99)
GLUCOSE-CAPILLARY: 125 mg/dL — AB (ref 65–99)
Glucose-Capillary: 120 mg/dL — ABNORMAL HIGH (ref 65–99)
Glucose-Capillary: 110 mg/dL — ABNORMAL HIGH (ref 65–99)

## 2017-04-01 LAB — CBC
HEMATOCRIT: 32.6 % — AB (ref 35.0–47.0)
Hemoglobin: 10.4 g/dL — ABNORMAL LOW (ref 12.0–16.0)
MCH: 28.7 pg (ref 26.0–34.0)
MCHC: 31.9 g/dL — ABNORMAL LOW (ref 32.0–36.0)
MCV: 90.1 fL (ref 80.0–100.0)
PLATELETS: 77 10*3/uL — AB (ref 150–440)
RBC: 3.62 MIL/uL — AB (ref 3.80–5.20)
RDW: 16.5 % — ABNORMAL HIGH (ref 11.5–14.5)
WBC: 7.5 10*3/uL (ref 3.6–11.0)

## 2017-04-01 LAB — BASIC METABOLIC PANEL
ANION GAP: 3 — AB (ref 5–15)
BUN: 39 mg/dL — ABNORMAL HIGH (ref 6–20)
CHLORIDE: 112 mmol/L — AB (ref 101–111)
CO2: 28 mmol/L (ref 22–32)
Calcium: 9.2 mg/dL (ref 8.9–10.3)
Creatinine, Ser: 0.95 mg/dL (ref 0.44–1.00)
GFR calc Af Amer: >60 mL/min (ref >60)
GFR calc non Af Amer: >60 mL/min (ref >60)
Glucose, Bld: 98 mg/dL (ref 65–99)
POTASSIUM: 4.1 mmol/L (ref 3.5–5.1)
Sodium: 143 mmol/L (ref 135–145)

## 2017-04-01 LAB — CULTURE, BLOOD (ROUTINE X 2)
CULTURE: NO GROWTH
Special Requests: ADEQUATE

## 2017-04-01 LAB — PHOSPHORUS: Phosphorus: 3 mg/dL (ref 2.5–4.6)

## 2017-04-01 LAB — MAGNESIUM: Magnesium: 2.3 mg/dL (ref 1.7–2.4)

## 2017-04-01 MED ORDER — SODIUM CHLORIDE 0.9% FLUSH
10.0000 mL | INTRAVENOUS | Status: DC | PRN
Start: 2017-04-01 — End: 2017-04-08
  Administered 2017-04-01: 30 mL
  Filled 2017-04-01: qty 40

## 2017-04-01 MED ORDER — COSYNTROPIN 0.25 MG IJ SOLR
0.2500 mg | Freq: Once | INTRAMUSCULAR | Status: AC
  Administered 2017-04-02: 0.25 mg via INTRAVENOUS
  Filled 2017-04-01: qty 0.25

## 2017-04-01 NOTE — Progress Notes (Signed)
Patient ID: Sarah Hurst, female   DOB: 1961/01/07, 56 y.o.   MRN: [id number]  [employer] PROGRESS NOTE  Sarah Hurst WUJ:[id number] DOB: 03-12-1961 DOA: 03/24/2017 PCP: System, Pcp Not In  HPI/Subjective: remains bradycardic, no new issues  Objective: Vitals:   04/01/17 0842 04/01/17 1945  BP: (!) 164/85 (!) 172/83  Pulse: (!) 36 (!) 40  Resp: 16 17  Temp: 98.2 F (36.8 C) 98 F (36.7 C)  SpO2: 97% 98%    Filed Weights   03/24/17 1210 03/31/17 1051  Weight: (!) 143.8 kg (317 lb) (!) 152.4 kg (336 lb)    ROS: Review of Systems  Unable to perform ROS: Acuity of condition  Respiratory: Negative for cough.   Cardiovascular: Negative for chest pain.  Gastrointestinal: Negative for abdominal pain, constipation, nausea and vomiting.  Musculoskeletal: Negative for back pain and neck pain.  Neurological: Positive for weakness. Negative for dizziness.   Exam: Physical Exam  HENT:  Nose: No mucosal edema.  Mouth/Throat: No oropharyngeal exudate.  Unable to look in her mouth  Eyes: EOM and lids are normal.  Pupils pinpoint  Neck: Carotid bruit is not present.  Cardiovascular: Regular rhythm, S1 normal, S2 normal and normal heart sounds.   Respiratory: No respiratory distress. She has decreased breath sounds in the right lower field and the left lower field. She has no wheezes. She has no rhonchi. She has no rales.  GI: Soft. Bowel sounds are normal. There is no tenderness.  Musculoskeletal:       Right ankle: She exhibits swelling.       Left ankle: She exhibits swelling.  Neurological: She is alert.  Able to wiggle toes and feet herself with a fork.  Skin: Skin is warm. No rash noted. Nails show no clubbing.  Psychiatric: She has a normal mood and affect. She is not actively hallucinating.      Data Reviewed: Basic Metabolic Panel:  Recent Labs Lab 03/27/17 0817 03/28/17 0410 03/29/17 0355 03/31/17 0448 04/01/17 0532  NA 147* 148* 146* 145 143  K  3.4* 4.1 4.3 4.1 4.1  CL 113* 119* 113* 113* 112*  CO2 29 28 27 27 28   GLUCOSE 85 91 163* 109* 98  BUN 10 12 12  26* 39*  CREATININE 0.61 1.12* 0.80 0.83 0.95  CALCIUM 8.9 8.1* 8.4* 9.2 9.2  MG  --   --  1.4*  --  2.3  PHOS  --   --  2.3*  --  3.0   Liver Function Tests:  Recent Labs Lab 03/26/17 1750 03/27/17 0817 03/28/17 0410  AST 64* 61* 50*  ALT 42 47 41  ALKPHOS 44 60 51  BILITOT 0.6 0.5 0.3  PROT 5.2* 5.8* 5.4*  ALBUMIN 2.4* 2.6* 2.4*   No results for input(s): AMMONIA in the last 168 hours. CBC:  Recent Labs Lab 03/26/17 1750 03/27/17 0817 03/28/17 0410 03/29/17 0355 04/01/17 0532  WBC 4.0 3.6 5.6 6.5 7.5  NEUTROABS  --   --  4.0  --   --   HGB 10.7* 11.3* 10.5* 10.4* 10.4*  HCT 34.2* 35.6 33.7* 32.4* 32.6*  MCV 90.4 90.3 90.4 91.0 90.1  PLT 104* 99* 101* 80* 77*   Cardiac Enzymes: No results for input(s): CKTOTAL, CKMB, CKMBINDEX, TROPONINI in the last 168 hours. BNP (last 3 results)  Recent Labs  11/25/16 0110  BNP 91.0     CBG:  Recent Labs Lab 04/01/17 0513 04/01/17 0734 04/01/17 1105 04/01/17 1711 04/01/17 2051  GLUCAP 95  100* 120* 110* 125*    Recent Results (from the past 240 hour(s))  MRSA PCR Screening     Status: None   Collection Time: 03/27/17 12:17 PM  Result Value Ref Range Status   MRSA by PCR NEGATIVE NEGATIVE Final    Comment:        The GeneXpert MRSA Assay (FDA approved for NASAL specimens only), is one component of a comprehensive MRSA colonization surveillance program. It is not intended to diagnose MRSA infection nor to guide or monitor treatment for MRSA infections.   CULTURE, BLOOD (ROUTINE X 2) w Reflex to ID Panel     Status: Abnormal   Collection Time: 03/27/17  1:51 PM  Result Value Ref Range Status   Specimen Description BLOOD RIGHT HAND  Final   Special Requests   Final    BOTTLES DRAWN AEROBIC AND ANAEROBIC Blood Culture adequate volume   Culture  Setup Time   Final    GRAM POSITIVE  COCCI AEROBIC BOTTLE ONLY CRITICAL RESULT CALLED TO, READ BACK BY AND VERIFIED WITH: LISA CLUTTS AT 1605 03/28/17. MSS    Culture (A)  Final    STAPHYLOCOCCUS SPECIES (COAGULASE NEGATIVE) THE SIGNIFICANCE OF ISOLATING THIS ORGANISM FROM A SINGLE SET OF BLOOD CULTURES WHEN MULTIPLE SETS ARE DRAWN IS UNCERTAIN. PLEASE NOTIFY THE MICROBIOLOGY DEPARTMENT WITHIN ONE WEEK IF SPECIATION AND SENSITIVITIES ARE REQUIRED. Performed at [facility], [place] [address] [place], [place] [address]    Report Status 03/30/2017 FINAL  Final  Blood Culture ID Panel (Reflexed)     Status: Abnormal   Collection Time: 03/27/17  1:51 PM  Result Value Ref Range Status   Enterococcus species NOT DETECTED NOT DETECTED Final   Listeria monocytogenes NOT DETECTED NOT DETECTED Final   Staphylococcus species DETECTED (A) NOT DETECTED Final    Comment: Methicillin (oxacillin) susceptible coagulase negative staphylococcus. Possible blood culture contaminant (unless isolated from more than one blood culture draw or clinical case suggests pathogenicity). No antibiotic treatment is indicated for blood  culture contaminants. CRITICAL RESULT CALLED TO, READ BACK BY AND VERIFIED WITH: LISA CLUTTS AT 1605 03/28/17. MSS    Staphylococcus aureus NOT DETECTED NOT DETECTED Final   Methicillin resistance NOT DETECTED NOT DETECTED Final   Streptococcus species NOT DETECTED NOT DETECTED Final   Streptococcus agalactiae NOT DETECTED NOT DETECTED Final   Streptococcus pneumoniae NOT DETECTED NOT DETECTED Final   Streptococcus pyogenes NOT DETECTED NOT DETECTED Final   Acinetobacter baumannii NOT DETECTED NOT DETECTED Final   Enterobacteriaceae species NOT DETECTED NOT DETECTED Final   Enterobacter cloacae complex NOT DETECTED NOT DETECTED Final   Escherichia coli NOT DETECTED NOT DETECTED Final   Klebsiella oxytoca NOT DETECTED NOT DETECTED Final   Klebsiella pneumoniae NOT DETECTED NOT DETECTED Final   Proteus species NOT  DETECTED NOT DETECTED Final   Serratia marcescens NOT DETECTED NOT DETECTED Final   Haemophilus influenzae NOT DETECTED NOT DETECTED Final   Neisseria meningitidis NOT DETECTED NOT DETECTED Final   Pseudomonas aeruginosa NOT DETECTED NOT DETECTED Final   Candida albicans NOT DETECTED NOT DETECTED Final   Candida glabrata NOT DETECTED NOT DETECTED Final   Candida krusei NOT DETECTED NOT DETECTED Final   Candida parapsilosis NOT DETECTED NOT DETECTED Final   Candida tropicalis NOT DETECTED NOT DETECTED Final  Urine Culture     Status: None   Collection Time: 03/27/17  2:41 PM  Result Value Ref Range Status   Specimen Description URINE, RANDOM  Final   Special  Requests NONE  Final   Culture   Final    NO GROWTH Performed at [facility], [place] [address], [place], [place] [address]    Report Status 03/28/2017 FINAL  Final  CULTURE, BLOOD (ROUTINE X 2) w Reflex to ID Panel     Status: None   Collection Time: 03/27/17  3:10 PM  Result Value Ref Range Status   Specimen Description BLOOD BLOOD RIGHT HAND  Final   Special Requests   Final    BOTTLES DRAWN AEROBIC AND ANAEROBIC Blood Culture adequate volume   Culture NO GROWTH 5 DAYS  Final   Report Status 04/01/2017 FINAL  Final     Studies: No results found.  Scheduled Meds: . chlorhexidine gluconate (MEDLINE KIT)  15 mL Mouth Rinse BID  . [START ON 04/02/2017] cosyntropin  0.25 mg Intravenous Once  . dextrose  1 ampule Intravenous Once  . dextrose  1 ampule Intravenous Once  . enoxaparin (LOVENOX) injection  40 mg Subcutaneous Q12H  . hydrocortisone sod succinate (SOLU-CORTEF) inj  50 mg Intravenous Q6H  . mouth rinse  15 mL Mouth Rinse QID  . multivitamin with minerals  1 tablet Oral Daily  . protein supplement shake  11 oz Oral TID BM   Continuous Infusions:   Assessment/Plan:  1. Bradycardia - Cardio following. May need EP eval, will get CT abd to eval for any abd patho per cardio recommendation  - check cosyn stim  test - likely due to underlying sleep apnea 2. Clinical sepsis: ruled out 3. Hypoglycemia.  Now that the patient is eating, sugars improved. Off D5 drip 4. Acute encephalopathy: improving 5. History of schizoaffective disorder.  Can consider restarting psychiatric medications if she is able to take it 6. History of sleep apnea 7. History of GERD 8. Thrombocytopenia.  Continue to monitor closely. Platelets dropping, now at 77, may need onco eval 9. Hypomagnesemia.  Replete and recheck  Code Status:     Code Status Orders        Start     Ordered   03/27/17 1700  Full code  Continuous     03/27/17 1659    Code Status History    Date Active Date Inactive Code Status Order ID Comments User Context   01/24/2017  5:35 PM 01/28/2017  8:51 PM Full Code [id number]  Julianne Rice, MD ED   01/10/2017 10:02 PM 01/11/2017  6:32 PM Full Code [id number]  Daleen Bo, MD ED   12/26/2016  5:18 PM 12/26/2016 11:20 PM Full Code [id number]  Julianne Rice, MD ED   12/13/2016  3:16 PM 12/14/2016 11:02 PM Full Code [id number]  Noemi Chapel, MD ED     Family Communication: No family at bedside Disposition Plan: To be determined -Education officer, museum aware and working on placement  Consultants:  ID  Neurology  Psychiatry  Antibiotics:  None  Time spent: 28 minutes  [place]

## 2017-04-01 NOTE — Progress Notes (Signed)
Progress Note  Patient Name: Sarah Hurst Date of Encounter: 04/01/2017  Primary Cardiologist: New to [facility] - consult by Fletcher Anon  Subjective   Remains bradycardic with heart rates in the 30s to 40s bpm with the longest pause of 4.21 seconds. Potassium 4.1. HGB stable at 10.4, PLT count low at 77 (baseline ~ 100), magnesium repleted to 2.3. Not abdominal pain and nausea. Does not want to wear CPAP.   Inpatient Medications    Scheduled Meds: . chlorhexidine gluconate (MEDLINE KIT)  15 mL Mouth Rinse BID  . dextrose  1 ampule Intravenous Once  . dextrose  1 ampule Intravenous Once  . enoxaparin (LOVENOX) injection  40 mg Subcutaneous Q12H  . hydrocortisone sod succinate (SOLU-CORTEF) inj  50 mg Intravenous Q6H  . mouth rinse  15 mL Mouth Rinse QID  . multivitamin with minerals  1 tablet Oral Daily  . protein supplement shake  11 oz Oral TID BM   Continuous Infusions:  PRN Meds: acetaminophen **OR** acetaminophen, atropine   Vital Signs    Vitals:   03/31/17 1803 03/31/17 1947 04/01/17 0342 04/01/17 0842  BP: (!) 150/86 (!) 139/100 (!) 141/91 (!) 164/85  Pulse: (!) 38 (!) 37 (!) 42 (!) 36  Resp: _0 Temp: 98.1 F (36.7 C) 98.1 F (36.7 C) 98.7 F (37.1 C) 98.2 F (36.8 C)  TempSrc: Oral Oral Oral Oral  SpO2: 97% 99% 96% 97%  Weight:      Height:        Intake/Output Summary (Last 24 hours) at 04/01/17 0902 Last data filed at 03/31/17 2000  Gross per 24 hour  Intake           119.17 ml  Output              400 ml  Net          -280.83 ml   Filed Weights   03/24/17 1210 03/31/17 1051  Weight: (!) 317 lb (143.8 kg) (!) 336 lb (152.4 kg)    Telemetry    Sinus bradycardic, 30s to 40s bpm, longest pause of 4.21 seconds - Personally Reviewed  ECG    n/a - Personally Reviewed  Physical Exam   GEN: No acute distress.   Neck: No JVD. Cardiac: Bradycardic, no murmurs, rubs, or gallops.  Respiratory: Clear to auscultation bilaterally.  GI: Soft,  nontender, non-distended.   MS: No edema; No deformity. Neuro:  Alert ; Nonfocal.  Psych: Flat affect.  Labs    Chemistry Recent Labs Lab 03/26/17 1750 03/27/17 0817 03/28/17 0410 03/29/17 0355 03/31/17 0448 04/01/17 0532  NA 148* 147* 148* 146* 145 143  K 3.3* 3.4* 4.1 4.3 4.1 4.1  CL 117* 113* 119* 113* 113* 112*  CO2 _1 GLUCOSE 74 85 91 163* 109* 98  BUN _2 26* 39*  CREATININE 0.49 0.61 1.12* 0.80 0.83 0.95  CALCIUM 8.0* 8.9 8.1* 8.4* 9.2 9.2  PROT 5.2* 5.8* 5.4*  --   --   --   ALBUMIN 2.4* 2.6* 2.4*  --   --   --   AST 64* 61* 50*  --   --   --   ALT 42 47 41  --   --   --   ALKPHOS 44 60 51  --   --   --   BILITOT 0.6 0.5 0.3  --   --   --   GFRNONAA >60 >60  53* >60 >60 >60  GFRAA >60 >60 >60 >60 >60 >60  ANIONGAP 7 5 1* 6 5 3*     Hematology Recent Labs Lab 03/28/17 0410 03/29/17 0355 04/01/17 0532  WBC 5.6 6.5 7.5  RBC 3.72* 3.56* 3.62*  HGB 10.5* 10.4* 10.4*  HCT 33.7* 32.4* 32.6*  MCV 90.4 91.0 90.1  MCH 28.2 29.2 28.7  MCHC 31.1* 32.1 31.9*  RDW 17.3* 17.3* 16.5*  PLT 101* 80* 77*    Cardiac EnzymesNo results for input(s): TROPONINI in the last 168 hours. No results for input(s): TROPIPOC in the last 168 hours.   BNPNo results for input(s): BNP, PROBNP in the last 168 hours.   DDimer No results for input(s): DDIMER in the last 168 hours.   Radiology    No results found.  Cardiac Studies   Echo 03/29/2017: Study Conclusions  - Left ventricle: The cavity size was normal. Systolic function was   normal. The estimated ejection fraction was in the range of 60%   to 65%. Wall motion was normal; there were no regional wall   motion abnormalities. Doppler parameters are consistent with   abnormal left ventricular relaxation (grade 1 diastolic   dysfunction). - Left atrium: The atrium was normal in size. - Right ventricle: Systolic function was normal. - Pulmonary arteries: Systolic pressure was mildly elevated.  PA   peak pressure: 43 mm Hg (S).  Patient Profile     56 y.o. female with history of schizoaffective disorder with suicidal ideations, chronic pain disorder, and sleep apnea who is being seen today for the evaluation of bradycardia at the request of Dr. Manuella Ghazi, MD.  Assessment & Plan    1. Bradycardia: -Asymptomatic  -Possibly in the setting of poorly controlled sleep apnea -Longest pause of 4.21 seconds -Will need EP evaluation for possible PPM -Lytes at goal -Not on any rate-limiting medications currently, continue to avoid -Recommend Corti-stim test, defer to IM -Check free T4 (normal TSH 10/25) -Ambulate to evaluate for appropriate chronotropic response  2. Sleep apnea: -Recommend CPAP if able/would wear -Reports "I do not want to wear that."  3. Abdominal pain/nausea: -Per IM   For questions or updates, please contact [place] Please consult [email or web address] for contact info under Cardiology/STEMI.    Signed, Christell Faith, PA-C [facility] Pager: [phone] 04/01/2017, 9:02 AM

## 2017-04-01 NOTE — Progress Notes (Signed)
[facility] INFECTIOUS DISEASE PROGRESS NOTE Date of Admission:  03/24/2017     ID: Sarah Hurst is a 56 y.o. female with AMS Principal Problem:   Schizoaffective disorder, bipolar type ([place]) Active Problems:   Hypoglycemia   Hypercarbia   Subjective: Out of unit, no fevers, not as alert this pm  ROS  Unable to obtain  Medications:  Antibiotics Given (last 72 hours)    Date/Time Action Medication Dose Rate   03/29/17 2123 New Bag/Given   piperacillin-tazobactam (ZOSYN) IVPB 3.375 g 3.375 g 12.5 mL/hr   03/30/17 1230 New Bag/Given  [given in CCU by other; pt transferred with this running upon transfer]   piperacillin-tazobactam (ZOSYN) IVPB 3.375 g 3.375 g 12.5 mL/hr     . chlorhexidine gluconate (MEDLINE KIT)  15 mL Mouth Rinse BID  . dextrose  1 ampule Intravenous Once  . dextrose  1 ampule Intravenous Once  . enoxaparin (LOVENOX) injection  40 mg Subcutaneous Q12H  . hydrocortisone sod succinate (SOLU-CORTEF) inj  50 mg Intravenous Q6H  . mouth rinse  15 mL Mouth Rinse QID  . multivitamin with minerals  1 tablet Oral Daily  . protein supplement shake  11 oz Oral TID BM    Objective: Vital signs in last 24 hours: Temp:  [98.1 F (36.7 C)-98.7 F (37.1 C)] 98.2 F (36.8 C) (10/30 0842) Pulse Rate:  [36-42] 36 (10/30 0842) Resp:  [16-20] 16 (10/30 0842) BP: (139-164)/(85-100) 164/85 (10/30 0842) SpO2:  [96 %-99 %] 97 % (10/30 7628) Physical Exam  Constitutional:  Sleepy and with slowed mentation, HENT: [place]/AT, PERRLA, no scleral icterus Mouth/Throat: Oropharynx is clear and moist. No oropharyngeal exudate.  Cardiovascular: Normal rate, regular rhythm and normal heart sounds. Exam reveals no gallop and no friction rub.  No murmur heard.  Pulmonary/Chest: Effort normal and breath sounds normal. No respiratory distress.  has no wheezes.  Neck = supple, no nuchal rigidity Abdominal: Soft. Bowel sounds are normal.  exhibits no distension. There is no tenderness.   Lymphadenopathy: no cervical adenopathy. No axillary adenopathy Neurological: sleepy and with slowed mentation  Skin: Skin is warm and dry. No rash noted. No erythema. .  Access R neck IJ line  Lab Results  Recent Labs  03/31/17 0448 04/01/17 0532  WBC  --  7.5  HGB  --  10.4*  HCT  --  32.6*  NA 145 143  K 4.1 4.1  CL 113* 112*  CO2 27 28  BUN 26* 39*  CREATININE 0.83 0.95    Microbiology: Results for orders placed or performed during the hospital encounter of 03/24/17  MRSA PCR Screening     Status: None   Collection Time: 03/27/17 12:17 PM  Result Value Ref Range Status   MRSA by PCR NEGATIVE NEGATIVE Final    Comment:        The GeneXpert MRSA Assay (FDA approved for NASAL specimens only), is one component of a comprehensive MRSA colonization surveillance program. It is not intended to diagnose MRSA infection nor to guide or monitor treatment for MRSA infections.   CULTURE, BLOOD (ROUTINE X 2) w Reflex to ID Panel     Status: Abnormal   Collection Time: 03/27/17  1:51 PM  Result Value Ref Range Status   Specimen Description BLOOD RIGHT HAND  Final   Special Requests   Final    BOTTLES DRAWN AEROBIC AND ANAEROBIC Blood Culture adequate volume   Culture  Setup Time   Final    GRAM POSITIVE COCCI  AEROBIC BOTTLE ONLY CRITICAL RESULT CALLED TO, READ BACK BY AND VERIFIED WITH: LISA CLUTTS AT 1605 03/28/17. MSS    Culture (A)  Final    STAPHYLOCOCCUS SPECIES (COAGULASE NEGATIVE) THE SIGNIFICANCE OF ISOLATING THIS ORGANISM FROM A SINGLE SET OF BLOOD CULTURES WHEN MULTIPLE SETS ARE DRAWN IS UNCERTAIN. PLEASE NOTIFY THE MICROBIOLOGY DEPARTMENT WITHIN ONE WEEK IF SPECIATION AND SENSITIVITIES ARE REQUIRED. Performed at [facility], [place] [address] [place], [place] [address]    Report Status 03/30/2017 FINAL  Final  Blood Culture ID Panel (Reflexed)     Status: Abnormal   Collection Time: 03/27/17  1:51 PM  Result Value Ref Range Status   Enterococcus  species NOT DETECTED NOT DETECTED Final   Listeria monocytogenes NOT DETECTED NOT DETECTED Final   Staphylococcus species DETECTED (A) NOT DETECTED Final    Comment: Methicillin (oxacillin) susceptible coagulase negative staphylococcus. Possible blood culture contaminant (unless isolated from more than one blood culture draw or clinical case suggests pathogenicity). No antibiotic treatment is indicated for blood  culture contaminants. CRITICAL RESULT CALLED TO, READ BACK BY AND VERIFIED WITH: LISA CLUTTS AT 1605 03/28/17. MSS    Staphylococcus aureus NOT DETECTED NOT DETECTED Final   Methicillin resistance NOT DETECTED NOT DETECTED Final   Streptococcus species NOT DETECTED NOT DETECTED Final   Streptococcus agalactiae NOT DETECTED NOT DETECTED Final   Streptococcus pneumoniae NOT DETECTED NOT DETECTED Final   Streptococcus pyogenes NOT DETECTED NOT DETECTED Final   Acinetobacter baumannii NOT DETECTED NOT DETECTED Final   Enterobacteriaceae species NOT DETECTED NOT DETECTED Final   Enterobacter cloacae complex NOT DETECTED NOT DETECTED Final   Escherichia coli NOT DETECTED NOT DETECTED Final   Klebsiella oxytoca NOT DETECTED NOT DETECTED Final   Klebsiella pneumoniae NOT DETECTED NOT DETECTED Final   Proteus species NOT DETECTED NOT DETECTED Final   Serratia marcescens NOT DETECTED NOT DETECTED Final   Haemophilus influenzae NOT DETECTED NOT DETECTED Final   Neisseria meningitidis NOT DETECTED NOT DETECTED Final   Pseudomonas aeruginosa NOT DETECTED NOT DETECTED Final   Candida albicans NOT DETECTED NOT DETECTED Final   Candida glabrata NOT DETECTED NOT DETECTED Final   Candida krusei NOT DETECTED NOT DETECTED Final   Candida parapsilosis NOT DETECTED NOT DETECTED Final   Candida tropicalis NOT DETECTED NOT DETECTED Final  Urine Culture     Status: None   Collection Time: 03/27/17  2:41 PM  Result Value Ref Range Status   Specimen Description URINE, RANDOM  Final   Special Requests  NONE  Final   Culture   Final    NO GROWTH Performed at [facility], [address] [place], [place] [address]    Report Status 03/28/2017 FINAL  Final  CULTURE, BLOOD (ROUTINE X 2) w Reflex to ID Panel     Status: None   Collection Time: 03/27/17  3:10 PM  Result Value Ref Range Status   Specimen Description BLOOD BLOOD RIGHT HAND  Final   Special Requests   Final    BOTTLES DRAWN AEROBIC AND ANAEROBIC Blood Culture adequate volume   Culture NO GROWTH 5 DAYS  Final   Report Status 04/01/2017 FINAL  Final    Studies/Results: No results found.  Assessment/Plan: 56 yo with a history of schizoaffective disorder, GERD, sleep apnea admitted with hypoglycemia, hypotension and hypothermia. WBC normal, UA neg and procalcitonin normal. No obvious source of infection at this point.  Treated with zosyn and now out of unit, off pressors and getting  back to baseline. Still no fevers, wbc nml, cxs with 1/2 + CNS (likely contaminant).  Procalcitonin nml  10/30 - no fevers, cultures negative.  No evidence of active infection. CNS likely contaminant.   Recommendations Would continue off all abx and monitor wbc, temperature and cultures Thank you very much for the consult. Leonel Ramsay   04/01/2017, 2:40 PM

## 2017-04-02 ENCOUNTER — Inpatient Hospital Stay: Payer: Medicaid Other

## 2017-04-02 LAB — BASIC METABOLIC PANEL
ANION GAP: 5 (ref 5–15)
BUN: 39 mg/dL — ABNORMAL HIGH (ref 6–20)
CALCIUM: 9.1 mg/dL (ref 8.9–10.3)
CO2: 28 mmol/L (ref 22–32)
Chloride: 110 mmol/L (ref 101–111)
Creatinine, Ser: 0.89 mg/dL (ref 0.44–1.00)
GFR calc Af Amer: >60 mL/min (ref >60)
GFR calc non Af Amer: >60 mL/min (ref >60)
GLUCOSE: 103 mg/dL — AB (ref 65–99)
Potassium: 4 mmol/L (ref 3.5–5.1)
Sodium: 143 mmol/L (ref 135–145)

## 2017-04-02 LAB — GLUCOSE, CAPILLARY
GLUCOSE-CAPILLARY: 98 mg/dL (ref 65–99)
Glucose-Capillary: 120 mg/dL — ABNORMAL HIGH (ref 65–99)
Glucose-Capillary: 89 mg/dL (ref 65–99)
Glucose-Capillary: 86 mg/dL (ref 65–99)

## 2017-04-02 LAB — ACTH STIMULATION, 3 TIME POINTS
CORTISOL 30 MIN: 77.7 ug/dL
CORTISOL 60 MIN: 69.2 ug/dL
Cortisol, Base: 80 ug/dL

## 2017-04-02 LAB — CBC
HEMATOCRIT: 33 % — AB (ref 35.0–47.0)
HEMOGLOBIN: 10.7 g/dL — AB (ref 12.0–16.0)
MCH: 29.1 pg (ref 26.0–34.0)
MCHC: 32.5 g/dL (ref 32.0–36.0)
MCV: 89.6 fL (ref 80.0–100.0)
Platelets: 79 10*3/uL — ABNORMAL LOW (ref 150–440)
RBC: 3.68 MIL/uL — ABNORMAL LOW (ref 3.80–5.20)
RDW: 16.6 % — AB (ref 11.5–14.5)
WBC: 7.6 10*3/uL (ref 3.6–11.0)

## 2017-04-02 MED ORDER — HYDROCORTISONE NA SUCCINATE PF 100 MG IJ SOLR
50.0000 mg | Freq: Two times a day (BID) | INTRAMUSCULAR | Status: DC
  Administered 2017-04-02 – 2017-04-03 (×2): 50 mg via INTRAVENOUS
  Filled 2017-04-02 (×2): qty 2

## 2017-04-02 MED ORDER — IOPAMIDOL (ISOVUE-300) INJECTION 61%
100.0000 mL | Freq: Once | INTRAVENOUS | Status: AC | PRN
  Administered 2017-04-02: 100 mL via INTRAVENOUS

## 2017-04-02 MED ORDER — IOPAMIDOL (ISOVUE-300) INJECTION 61%
15.0000 mL | INTRAVENOUS | Status: AC
  Administered 2017-04-02 (×2): 15 mL via ORAL

## 2017-04-02 MED ORDER — IOPAMIDOL (ISOVUE-M 300) INJECTION 61%: 15.0000 mL | Freq: Once | INTRAMUSCULAR | Status: DC | PRN

## 2017-04-02 NOTE — Clinical Social Work Note (Signed)
CSW was informed that patient has a legal guardian.  Patient's legal Guardian is Hilbert OdorVanda Thomas [phone]) with [employer].  Ervin KnackEric R. Brent Taillon, MSW, Theresia MajorsLCSWA [phone]  04/02/2017 12:47 PM

## 2017-04-02 NOTE — Progress Notes (Signed)
Pt's HR in the low 20's this morning non sustaining. 1x dose of PRN IV Atropine 0.5mg  given, HR in the 60's at this time.

## 2017-04-02 NOTE — Progress Notes (Signed)
Patient received 1 dose of atropine for bradycardia after being transferred to unit. Sarah Listenyan Dunn, PA and Dr. Mariah MillingGollan aware of persistent bradycardia. No new orders currently. Patient alert and oriented, all other vss. Trudee KusterBrandi R [place]field

## 2017-04-02 NOTE — Progress Notes (Signed)
    Paged about patient's heart rate dropping into the 20s bpm. Received atropine with improvement in heart rate into the 60s bpm. BP has remained stable in the 60s bpm. Will perform orthostatic vital signs and have PT assess for appropriate chronotropic competence. As long as BP remains stable no further intervention needed at this time. EP to see when in the hospital. Per MD.

## 2017-04-02 NOTE — Progress Notes (Signed)
Pt acting impulsive at times this morning and yelling. She's also requesting for staff to help her OOB. Emotional support provided to patient. Will continue to monitor.

## 2017-04-02 NOTE — Care Management (Signed)
Patient required dose of IV Atropine for heart rate into the 20's. Transferred to ICU stepdown and there is discussion regarding pacemaker

## 2017-04-02 NOTE — Progress Notes (Addendum)
Progress Note  Patient Name: Sarah Hurst Date of Encounter: 04/02/2017  Primary Cardiologist: New to [facility] - consult by Fletcher Anon  Subjective   Given one dose of atropine this morning for heart rate in the 20s bpm, improvement to the 60s bpm.   Inpatient Medications    Scheduled Meds: . chlorhexidine gluconate (MEDLINE KIT)  15 mL Mouth Rinse BID  . dextrose  1 ampule Intravenous Once  . dextrose  1 ampule Intravenous Once  . enoxaparin (LOVENOX) injection  40 mg Subcutaneous Q12H  . hydrocortisone sod succinate (SOLU-CORTEF) inj  50 mg Intravenous Q6H  . mouth rinse  15 mL Mouth Rinse QID  . multivitamin with minerals  1 tablet Oral Daily  . protein supplement shake  11 oz Oral TID BM   Continuous Infusions:  PRN Meds: acetaminophen **OR** acetaminophen, atropine, sodium chloride flush   Vital Signs    Vitals:   04/01/17 0842 04/01/17 1945 04/02/17 0342 04/02/17 0614  BP: (!) 164/85 (!) 172/83 (!) 165/81 (!) 187/87  Pulse: (!) 36 (!) 40 (!) 34   Resp: _0 Temp: 98.2 F (36.8 C) 98 F (36.7 C) (!) 97.4 F (36.3 C)   TempSrc: Oral Oral Oral   SpO2: 97% 98% 100%   Weight:      Height:        Intake/Output Summary (Last 24 hours) at 04/02/17 0716 Last data filed at 04/01/17 2100  Gross per 24 hour  Intake              360 ml  Output              900 ml  Net             -540 ml   Filed Weights   03/24/17 1210 03/31/17 1051  Weight: (!) 317 lb (143.8 kg) (!) 336 lb (152.4 kg)    Telemetry    Sinus bradycardia, 30s to 40s bpm, occasional episode into the 20s bpm, longest pause of 3.2 seconds - Personally Reviewed  ECG    n/a - Personally Reviewed  Physical Exam   GEN: No acute distress.   Neck: No JVD. Cardiac: Bradycardic, no murmurs, rubs, or gallops.  Respiratory: Clear to auscultation bilaterally.  GI: Soft, nontender, non-distended.   MS: No edema; No deformity. Neuro:  Alert; Nonfocal.  Psych: Flat affect.  Labs     Chemistry Recent Labs Lab 03/26/17 1750 03/27/17 0817 03/28/17 0410  03/31/17 0448 04/01/17 0532 04/02/17 0420  NA 148* 147* 148*  < > 145 143 143  K 3.3* 3.4* 4.1  < > 4.1 4.1 4.0  CL 117* 113* 119*  < > 113* 112* 110  CO2 _1 < > _2 GLUCOSE 74 85 91  < > 109* 98 103*  BUN _3 < > 26* 39* 39*  CREATININE 0.49 0.61 1.12*  < > 0.83 0.95 0.89  CALCIUM 8.0* 8.9 8.1*  < > 9.2 9.2 9.1  PROT 5.2* 5.8* 5.4*  --   --   --   --   ALBUMIN 2.4* 2.6* 2.4*  --   --   --   --   AST 64* 61* 50*  --   --   --   --   ALT 42 47 41  --   --   --   --   ALKPHOS 44 60 51  --   --   --   --  BILITOT 0.6 0.5 0.3  --   --   --   --   GFRNONAA >60 >60 53*  < > >60 >60 >60  GFRAA >60 >60 >60  < > >60 >60 >60  ANIONGAP 7 5 1*  < > 5 3* 5  < > = values in this interval not displayed.   Hematology Recent Labs Lab 03/29/17 0355 04/01/17 0532 04/02/17 0420  WBC 6.5 7.5 7.6  RBC 3.56* 3.62* 3.68*  HGB 10.4* 10.4* 10.7*  HCT 32.4* 32.6* 33.0*  MCV 91.0 90.1 89.6  MCH 29.2 28.7 29.1  MCHC 32.1 31.9* 32.5  RDW 17.3* 16.5* 16.6*  PLT 80* 77* 79*    Cardiac EnzymesNo results for input(s): TROPONINI in the last 168 hours. No results for input(s): TROPIPOC in the last 168 hours.   BNPNo results for input(s): BNP, PROBNP in the last 168 hours.   DDimer No results for input(s): DDIMER in the last 168 hours.   Radiology    No results found.  Cardiac Studies   Echo 03/29/2017: Study Conclusions  - Left ventricle: The cavity size was normal. Systolic function was normal. The estimated ejection fraction was in the range of 60% to 65%. Wall motion was normal; there were no regional wall motion abnormalities. Doppler parameters are consistent with abnormal left ventricular relaxation (grade 1 diastolic dysfunction). - Left atrium: The atrium was normal in size. - Right ventricle: Systolic function was normal. - Pulmonary arteries: Systolic pressure was mildly  elevated. PA peak pressure: 43 mm Hg (S).  Patient Profile     56 y.o. female with history of schizoaffective disorder with suicidal ideations, chronic pain disorder, and sleep apneawho is being seen today for the evaluation of bradycardiaat the request of Dr. Manuella Ghazi, MD.  Assessment & Plan    1. Bradycardia/possible sinus node dysfunction: -Heart rates in the 30s and 40s persist -Required atropine this morning for heart rate in the 20s bpm -Possibly in the setting of poorly controlled sleep apnea -Longest pause of 4.21 seconds -Will need EP evaluation for possible PPM, though this is a difficult situation given her reluctance to wear CPAP and her underlying psychiatric disorder -EP to see on 11/1 -Lytes at goal -Not on any rate-limiting medications currently, continue to avoid -Recommend Corti-stim test, defer to IM -Check free T4 (normal TSH 10/25) -Ambulate to evaluate for appropriate chronotropic response  2. Sleep apnea: -Recommend CPAP if able/would wear -Reports "I do not want to wear that."  3. Abdominal pain/nausea: -CT abdomen/pelvis pending -Per IM  4. Thrombocytopenia: -Per IM  For questions or updates, please contact [place] Please consult [email or web address] for contact info under Cardiology/STEMI.    Signed, Christell Faith, PA-C [place] Pager: [phone] 04/02/2017, 7:16 AM   Attending Note Patient seen and examined, agree with detailed note above,  Patient presentation and plan discussed on rounds.   Poor historian, though alert and communicative to some degree Telemetry reviewed, heart rate 30s up to 40 with any movement Asymptomatic, blood pressure 903E up to 092 systolic  Review of notes indicating hypercapnia on arrival with mild hypoxia, hypothermia, Hypoglycemia requiring frequent amps of glucose Heart rate was doing well until several days ago, possibly around the time when Zyprexa was added, Risperdal was held  On he received  several doses of Zyprexa last Thursday and Friday, evening of 26 and 27 October  Heart rate has remained persistently low though again good perfusion pressure Notes are seeming to indicate she is not  at her baseline cognitive status  On physical exam she is very slow with communication, appears stunned, Lungs clear to auscultation bilaterally, heart sounds bradycardic, regular, no murmurs appreciated, abdomen soft nontender, no significant lower extremity edema.  She is obese  Lab work reviewed showing sodium 143, creatinine 0.89 with BUN 89, hematocrit 33, glucose 103 Previous TSH 3.5  A/P --Sinus bradycardia Started in the past several days it would seem Uncertain etiology, possibly started around the same time Zyprexa was added, only received 3 doses or so Still acting groggy like she is sedated despite all medications being held Conglomeration of symptoms is curious, presenting with mental status changes, hypercapnia, persistently low glucose level, hypothermia As she is relatively asymptomatic, would continue to monitor for now Would only use atropine for symptomatic bradycardia in the setting of hypertension Will discuss with EP  Greater than 50% was spent in counseling and coordination of care with patient Total encounter time 25 minutes or more   Signed: Esmond Plants  M.D., Ph.D. [facility]

## 2017-04-02 NOTE — Progress Notes (Signed)
Patient ID: Sarah Hurst, female   DOB: 1961/03/19, 56 y.o.   MRN: [id number]  [employer] PROGRESS NOTE  Audry Kauzlarich QIO:[id number] DOB: 03/02/1961 DOA: 03/24/2017 PCP: System, Pcp Not In  HPI/Subjective: Heart rate dropped in 20s requiring atropine and transfer to ICU/stepdown per cardiology.  Patient is asymptomatic  Objective: Vitals:   04/02/17 1500 04/02/17 1600  BP: 112/64 129/73  Pulse: (!) 46 (!) 43  Resp: 11 16  Temp:    SpO2: 95% 94%    Filed Weights   03/24/17 1210 03/31/17 1051  Weight: (!) 143.8 kg (317 lb) (!) 152.4 kg (336 lb)    ROS: Review of Systems  Unable to perform ROS: Acuity of condition  Respiratory: Negative for cough.   Cardiovascular: Negative for chest pain.  Gastrointestinal: Negative for abdominal pain, constipation, nausea and vomiting.  Musculoskeletal: Negative for back pain and neck pain.  Neurological: Positive for weakness. Negative for dizziness.   Exam: Physical Exam  HENT:  Nose: No mucosal edema.  Mouth/Throat: No oropharyngeal exudate.  Unable to look in her mouth  Eyes: EOM and lids are normal.  Pupils pinpoint  Neck: Carotid bruit is not present.  Cardiovascular: Regular rhythm, S1 normal, S2 normal and normal heart sounds.   Respiratory: No respiratory distress. She has decreased breath sounds in the right lower field and the left lower field. She has no wheezes. She has no rhonchi. She has no rales.  GI: Soft. Bowel sounds are normal. There is no tenderness.  Musculoskeletal:       Right ankle: She exhibits swelling.       Left ankle: She exhibits swelling.  Neurological: She is alert.  Able to wiggle toes and feet herself with a fork.  Skin: Skin is warm. No rash noted. Nails show no clubbing.  Psychiatric: She has a normal mood and affect. She is not actively hallucinating.      Data Reviewed: Basic Metabolic Panel:  Recent Labs Lab 03/28/17 0410 03/29/17 0355 03/31/17 0448 04/01/17 0532  04/02/17 0420  NA 148* 146* 145 143 143  K 4.1 4.3 4.1 4.1 4.0  CL 119* 113* 113* 112* 110  CO2 28 27 27 28 28   GLUCOSE 91 163* 109* 98 103*  BUN 12 12 26* 39* 39*  CREATININE 1.12* 0.80 0.83 0.95 0.89  CALCIUM 8.1* 8.4* 9.2 9.2 9.1  MG  --  1.4*  --  2.3  --   PHOS  --  2.3*  --  3.0  --    Liver Function Tests:  Recent Labs Lab 03/26/17 1750 03/27/17 0817 03/28/17 0410  AST 64* 61* 50*  ALT 42 47 41  ALKPHOS 44 60 51  BILITOT 0.6 0.5 0.3  PROT 5.2* 5.8* 5.4*  ALBUMIN 2.4* 2.6* 2.4*   No results for input(s): AMMONIA in the last 168 hours. CBC:  Recent Labs Lab 03/27/17 0817 03/28/17 0410 03/29/17 0355 04/01/17 0532 04/02/17 0420  WBC 3.6 5.6 6.5 7.5 7.6  NEUTROABS  --  4.0  --   --   --   HGB 11.3* 10.5* 10.4* 10.4* 10.7*  HCT 35.6 33.7* 32.4* 32.6* 33.0*  MCV 90.3 90.4 91.0 90.1 89.6  PLT 99* 101* 80* 77* 79*   Cardiac Enzymes: No results for input(s): CKTOTAL, CKMB, CKMBINDEX, TROPONINI in the last 168 hours. BNP (last 3 results)  Recent Labs  11/25/16 0110  BNP 91.0     CBG:  Recent Labs Lab 04/01/17 2051 04/02/17 0058 04/02/17 0344 04/02/17 0720 04/02/17  1101  GLUCAP 125* 120* 98 89 86       Studies: Ct Abdomen Pelvis W Contrast  Result Date: 04/02/2017 CLINICAL DATA:  Abdominal pain, nausea, vomiting. EXAM: CT ABDOMEN AND PELVIS WITH CONTRAST TECHNIQUE: Multidetector CT imaging of the abdomen and pelvis was performed using the standard protocol following bolus administration of intravenous contrast. COMPARISON:  Plain films 03/24/2017 FINDINGS: Lower chest: Heart is borderline in size. Dependent atelectasis in the lower lobes. No effusions. Hepatobiliary: Heterogeneous density and enhancement pattern throughout the liver. This may reflect geographic fatty infiltration. No measurable focal mass lesion. Gallbladder unremarkable. Pancreas: No focal abnormality or ductal dilatation. Spleen: No focal abnormality.  Normal size.  Adrenals/Urinary Tract: No adrenal abnormality. No focal renal abnormality. No stones or hydronephrosis. Urinary bladder is unremarkable. Foley catheter present in the bladder which is decompressed. Stomach/Bowel: Few scattered left colonic diverticula. No active diverticulitis. Appendix is normal. Stomach and small bowel decompressed, unremarkable. Vascular/Lymphatic: No evidence of aneurysm or adenopathy. Reproductive: Uterus and adnexa unremarkable.  No mass. Other:  Trace free fluid in the pelvis.  No free air. Musculoskeletal: No acute bony abnormality. Advanced degenerative disc and facet disease in the lower lumbar spine. IMPRESSION: Heterogeneous density and enhancement throughout the liver, suspect geographic fatty infiltration of the liver. Dependent bibasilar atelectasis. Scattered left colonic diverticula.  No active diverticulitis. Trace free fluid in the pelvis. Electronically Signed   By: Rolm Baptise M.D.   On: 04/02/2017 10:57    Scheduled Meds: . chlorhexidine gluconate (MEDLINE KIT)  15 mL Mouth Rinse BID  . enoxaparin (LOVENOX) injection  40 mg Subcutaneous Q12H  . hydrocortisone sod succinate (SOLU-CORTEF) inj  50 mg Intravenous Q12H  . mouth rinse  15 mL Mouth Rinse QID  . multivitamin with minerals  1 tablet Oral Daily  . protein supplement shake  11 oz Oral TID BM   Continuous Infusions:   Assessment/Plan:  1. Bradycardia - Cardio following. May need EP eval, CT abd - no acute pathology - checking cosyn stim test - likely due to underlying sleep apnea -Required atropine this morning as heart rate dropped in 20s with good response and heart rate back in 60s.  -Cardiology recommended monitoring in ICU/stepdown until EP eval tomorrow 2. Clinical sepsis: ruled out 3. Hypoglycemia.  Now that the patient is eating, sugars improved. Off D5 drip 4. Acute encephalopathy: improving 5. History of schizoaffective disorder.  Can consider restarting psychiatric medications if she  is able to take it 6. History of sleep apnea 7. History of GERD 8. Thrombocytopenia.  Continue to monitor closely. Platelets dropping, now at 77, may need onco eval 9. Hypomagnesemia.  Replete and recheck  Code Status: Full Code  Family Communication: No family at bedside Disposition Plan: To be determined -Education officer, museum aware and working on placement  Consultants:  ID  Neurology  Psychiatry  Antibiotics:  None  Time spent: 28 minutes  [place]

## 2017-04-02 NOTE — Progress Notes (Signed)
[facility] with bradycardia/possible sinus node dysfunction heart rate in the 20's with 4.21 second pause requiring atropine x1 dose with improvement of heart into the 60's.  Cardiology following pt and EP to see pt.  Pt does have a hx of OSA and is suppose to wear CPAP qhs, however she refuses to wear her CPAP.  She does have underlying psychiatric disorder and is confused to situation, therefore attempts at educating pt regarding importance of CPAP have been unsuccessful.  She is currently in no acute distress, awake and alert, following commands, sinus brady on cardiac monitor hr 40's with stable bp, and lungs clear throughout.  PCCM team will follow while pt in Stepdown Unit.  Sonda Rumbleana Blakeney, AGNP  Pulmonary/Critical Care Pager [phone] (please enter 7 digits) PCCM Consult Pager [phone] (please enter 7 digits)   Billy Fischeravid Simonds, MD PCCM service Mobile [phone] Pager [phone] 04/02/2017 2:11 PM

## 2017-04-02 NOTE — Progress Notes (Signed)
[facility] INFECTIOUS DISE[facility] PROGRESS NOTE Date of Admission:  03/24/2017     ID: Sarah Hurst is a 56 y.o. female with AMS Principal Problem:   Schizoaffective disorder, bipolar type ([place]) Active Problems:   Hypoglycemia   Hypercarbia   Subjective: No fevers, no hypothermia, HR has been low. WBC stable  ROS  Unable to obtain  Medications:  Antibiotics Given (last 72 hours)    Date/Time Action Medication Dose Rate   03/30/17 1230 New Bag/Given  [given in CCU by other; pt transferred with this running upon transfer]   piperacillin-tazobactam (ZOSYN) IVPB 3.375 g 3.375 g 12.5 mL/hr     . chlorhexidine gluconate (MEDLINE KIT)  15 mL Mouth Rinse BID  . dextrose  1 ampule Intravenous Once  . dextrose  1 ampule Intravenous Once  . enoxaparin (LOVENOX) injection  40 mg Subcutaneous Q12H  . hydrocortisone sod succinate (SOLU-CORTEF) inj  50 mg Intravenous Q6H  . iopamidol  15 mL Oral Q1 Hr x 2  . mouth rinse  15 mL Mouth Rinse QID  . multivitamin with minerals  1 tablet Oral Daily  . protein supplement shake  11 oz Oral TID BM    Objective: Vital signs in last 24 hours: Temp:  [97.4 F (36.3 C)-98.2 F (36.8 C)] 97.4 F (36.3 C) (10/31 0342) Pulse Rate:  [34-40] 34 (10/31 0342) Resp:  [16-18] 18 (10/31 0342) BP: (164-187)/(81-87) 187/87 (10/31 0614) SpO2:  [97 %-100 %] 100 % (10/31 0342) Physical Exam  Constitutional:  Sleepy and with slowed mentation, HENT: [place]/AT, PERRLA, no scleral icterus Mouth/Throat: Oropharynx is clear and moist. No oropharyngeal exudate.  Cardiovascular: Normal rate, regular rhythm and normal heart sounds. Exam reveals no gallop and no friction rub.  No murmur heard.  Pulmonary/Chest: Effort normal and breath sounds normal. No respiratory distress.  has no wheezes.  Neck = supple, no nuchal rigidity Abdominal: Soft. Bowel sounds are normal.  exhibits no distension. There is no tenderness.  Lymphadenopathy: no cervical adenopathy. No axillary  adenopathy Neurological: sleepy and with slowed mentation  Skin: Skin is warm and dry. No rash noted. No erythema. .  Access R neck IJ line  Lab Results  Recent Labs  04/01/17 0532 04/02/17 0420  WBC 7.5 7.6  HGB 10.4* 10.7*  HCT 32.6* 33.0*  NA 143 143  K 4.1 4.0  CL 112* 110  CO2 28 28  BUN 39* 39*  CREATININE 0.95 0.89    Microbiology: Results for orders placed or performed during the hospital encounter of 03/24/17  MRSA PCR Screening     Status: None   Collection Time: 03/27/17 12:17 PM  Result Value Ref Range Status   MRSA by PCR NEGATIVE NEGATIVE Final    Comment:        The GeneXpert MRSA Assay (FDA approved for NASAL specimens only), is one component of a comprehensive MRSA colonization surveillance program. It is not intended to diagnose MRSA infection nor to guide or monitor treatment for MRSA infections.   CULTURE, BLOOD (ROUTINE X 2) w Reflex to ID Panel     Status: Abnormal   Collection Time: 03/27/17  1:51 PM  Result Value Ref Range Status   Specimen Description BLOOD RIGHT HAND  Final   Special Requests   Final    BOTTLES DRAWN AEROBIC AND ANAEROBIC Blood Culture adequate volume   Culture  Setup Time   Final    GRAM POSITIVE COCCI AEROBIC BOTTLE ONLY CRITICAL RESULT CALLED TO, READ BACK BY AND VERIFIED  WITH: LISA CLUTTS AT 1605 03/28/17. MSS    Culture (A)  Final    STAPHYLOCOCCUS SPECIES (COAGULASE NEGATIVE) THE SIGNIFICANCE OF ISOLATING THIS ORGANISM FROM A SINGLE SET OF BLOOD CULTURES WHEN MULTIPLE SETS ARE DRAWN IS UNCERTAIN. PLEASE NOTIFY THE MICROBIOLOGY DEPARTMENT WITHIN ONE WEEK IF SPECIATION AND SENSITIVITIES ARE REQUIRED. Performed at [facility], [place] [address], [place], [place] [address]    Report Status 03/30/2017 FINAL  Final  Blood Culture ID Panel (Reflexed)     Status: Abnormal   Collection Time: 03/27/17  1:51 PM  Result Value Ref Range Status   Enterococcus species NOT DETECTED NOT DETECTED Final   Listeria  monocytogenes NOT DETECTED NOT DETECTED Final   Staphylococcus species DETECTED (A) NOT DETECTED Final    Comment: Methicillin (oxacillin) susceptible coagulase negative staphylococcus. Possible blood culture contaminant (unless isolated from more than one blood culture draw or clinical case suggests pathogenicity). No antibiotic treatment is indicated for blood  culture contaminants. CRITICAL RESULT CALLED TO, READ BACK BY AND VERIFIED WITH: LISA CLUTTS AT 1605 03/28/17. MSS    Staphylococcus aureus NOT DETECTED NOT DETECTED Final   Methicillin resistance NOT DETECTED NOT DETECTED Final   Streptococcus species NOT DETECTED NOT DETECTED Final   Streptococcus agalactiae NOT DETECTED NOT DETECTED Final   Streptococcus pneumoniae NOT DETECTED NOT DETECTED Final   Streptococcus pyogenes NOT DETECTED NOT DETECTED Final   Acinetobacter baumannii NOT DETECTED NOT DETECTED Final   Enterobacteriaceae species NOT DETECTED NOT DETECTED Final   Enterobacter cloacae complex NOT DETECTED NOT DETECTED Final   Escherichia coli NOT DETECTED NOT DETECTED Final   Klebsiella oxytoca NOT DETECTED NOT DETECTED Final   Klebsiella pneumoniae NOT DETECTED NOT DETECTED Final   Proteus species NOT DETECTED NOT DETECTED Final   Serratia marcescens NOT DETECTED NOT DETECTED Final   Haemophilus influenzae NOT DETECTED NOT DETECTED Final   Neisseria meningitidis NOT DETECTED NOT DETECTED Final   Pseudomonas aeruginosa NOT DETECTED NOT DETECTED Final   Candida albicans NOT DETECTED NOT DETECTED Final   Candida glabrata NOT DETECTED NOT DETECTED Final   Candida krusei NOT DETECTED NOT DETECTED Final   Candida parapsilosis NOT DETECTED NOT DETECTED Final   Candida tropicalis NOT DETECTED NOT DETECTED Final  Urine Culture     Status: None   Collection Time: 03/27/17  2:41 PM  Result Value Ref Range Status   Specimen Description URINE, RANDOM  Final   Special Requests NONE  Final   Culture   Final    NO  GROWTH Performed at [facility], [address] [place], [place] [address]    Report Status 03/28/2017 FINAL  Final  CULTURE, BLOOD (ROUTINE X 2) w Reflex to ID Panel     Status: None   Collection Time: 03/27/17  3:10 PM  Result Value Ref Range Status   Specimen Description BLOOD BLOOD RIGHT HAND  Final   Special Requests   Final    BOTTLES DRAWN AEROBIC AND ANAEROBIC Blood Culture adequate volume   Culture NO GROWTH 5 DAYS  Final   Report Status 04/01/2017 FINAL  Final    Studies/Results: No results found.  Assessment/Plan: 56 yo with a history of schizoaffective disorder, GERD, sleep apnea admitted with hypoglycemia, hypotension and hypothermia. WBC normal, UA neg and procalcitonin normal. No obvious source of infection at this point.  Treated with zosyn and now out of unit, off pressors and getting back to baseline. Still no fevers, wbc nml, cxs with 1/2 +  CNS (likely contaminant).  Procalcitonin nml  10/30 - no fevers, cultures negative.  No evidence of active infection. CNS likely contaminant.  10/31 - no fevers, wbc nml Recommendations Would continue off all abx and monitor wbc, temperature and cultures I will sign off but please call with questions.   Leonel Ramsay   04/02/2017, 8:21 AM

## 2017-04-02 NOTE — Progress Notes (Signed)
Patient assessed for PIV both visually and with ultrasound.  No suitable sites found.  Ultrasound shows all veins in forearm are very small and deep, upper arm cephalic found but is >2.5 cm deep.  Bedside RN updated.  Sarah LloydKerry Ma Munoz, RN VAST

## 2017-04-02 NOTE — Consult Note (Signed)
[facility] Psychiatry Consult   Reason for Consult: Follow-up consult 56 year old woman with schizoaffective disorder currently in the intensive care unit now having recurrent bradycardia Referring Physician:  Manuella Ghazi Patient Identification: Sarah Hurst MRN:  [id number] Principal Diagnosis: Schizoaffective disorder, bipolar type [facility]) Diagnosis:   Patient Active Problem List   Diagnosis Date Noted  . Hypercarbia [R06.89]   . Hypoglycemia [E16.2] 03/27/2017  . Schizoaffective disorder, bipolar type ([place]) [F25.0] 03/24/2017    Total Time spent with patient: 45 minutes  Subjective:   Sarah Hurst is a 56 y.o. female patient admitted with "I feel weak".  HPI: The patient interviewed chart reviewed.  This a 56 year old woman with a history of schizoaffective disorder who was admitted to the medical service with abnormal vitals and behavior.  I have been following her in consultation.  Patient has had an unstable course but appeared to be medically improving until she was just transferred back to the intensive care unit because of bradycardia.  Patient seen today.  She was awake alert and actually more interactive and appropriate than I have seen her in days.  She made good eye contact and answered questions fairly appropriately.  Acknowledge that she hears voices.  Did not make any threatening statements was not aggressive and had an understanding that she was in the hospital.  Past Psychiatric History: Long-standing history of schizoaffective disorder who lives in a group home.  Risk to Self: Is patient at risk for suicide?: No Risk to Others:   Prior Inpatient Therapy:   Prior Outpatient Therapy:    Past Medical History:  Past Medical History:  Diagnosis Date  . Asthma   . GERD (gastroesophageal reflux disease)   . Mental disability   . Schizoaffective disorder, bipolar type ([place])   . Sleep apnea     Past Surgical History:  Procedure Laterality Date  . WRIST SURGERY      Family History: History reviewed. No pertinent family history. Family Psychiatric  History: None known Social History:  History  Alcohol Use  . Yes    Comment: 1 cup of wine occasionally     History  Drug Use No    Social History   Social History  . Marital status: Single    Spouse name: N/A  . Number of children: N/A  . Years of education: N/A   Social History Main Topics  . Smoking status: Former Research scientist (life sciences)  . Smokeless tobacco: Never Used  . Alcohol use Yes     Comment: 1 cup of wine occasionally  . Drug use: No  . Sexual activity: Not Asked   Other Topics Concern  . None   Social History Narrative  . None   Additional Social History:    Allergies:  No Known Allergies  Labs:  Results for orders placed or performed during the hospital encounter of 03/24/17 (from the past 48 hour(s))  Glucose, capillary     Status: Abnormal   Collection Time: 03/31/17  4:46 PM  Result Value Ref Range   Glucose-Capillary 106 (H) 65 - 99 mg/dL   Comment 1 Notify RN   Glucose, capillary     Status: None   Collection Time: 03/31/17  9:02 PM  Result Value Ref Range   Glucose-Capillary 98 65 - 99 mg/dL   Comment 1 Notify RN   Glucose, capillary     Status: Abnormal   Collection Time: 03/31/17 11:54 PM  Result Value Ref Range   Glucose-Capillary 102 (H) 65 - 99 mg/dL  Comment 1 Notify RN   Glucose, capillary     Status: None   Collection Time: 04/01/17  5:13 AM  Result Value Ref Range   Glucose-Capillary 95 65 - 99 mg/dL   Comment 1 Notify RN   Phosphorus     Status: None   Collection Time: 04/01/17  5:32 AM  Result Value Ref Range   Phosphorus 3.0 2.5 - 4.6 mg/dL  Magnesium     Status: None   Collection Time: 04/01/17  5:32 AM  Result Value Ref Range   Magnesium 2.3 1.7 - 2.4 mg/dL  CBC     Status: Abnormal   Collection Time: 04/01/17  5:32 AM  Result Value Ref Range   WBC 7.5 3.6 - 11.0 K/uL   RBC 3.62 (L) 3.80 - 5.20 MIL/uL   Hemoglobin 10.4 (L) 12.0 - 16.0 g/dL    HCT 32.6 (L) 35.0 - 47.0 %   MCV 90.1 80.0 - 100.0 fL   MCH 28.7 26.0 - 34.0 pg   MCHC 31.9 (L) 32.0 - 36.0 g/dL   RDW 16.5 (H) 11.5 - 14.5 %   Platelets 77 (L) 150 - 440 K/uL  Basic metabolic panel     Status: Abnormal   Collection Time: 04/01/17  5:32 AM  Result Value Ref Range   Sodium 143 135 - 145 mmol/L   Potassium 4.1 3.5 - 5.1 mmol/L   Chloride 112 (H) 101 - 111 mmol/L   CO2 28 22 - 32 mmol/L   Glucose, Bld 98 65 - 99 mg/dL   BUN 39 (H) 6 - 20 mg/dL   Creatinine, Ser 0.95 0.44 - 1.00 mg/dL   Calcium 9.2 8.9 - 10.3 mg/dL   GFR calc non Af Amer >60 >60 mL/min   GFR calc Af Amer >60 >60 mL/min    Comment: (NOTE) The eGFR has been calculated using the CKD EPI equation. This calculation has not been validated in all clinical situations. eGFR's persistently <60 mL/min signify possible Chronic Kidney Disease.    Anion gap 3 (L) 5 - 15  Glucose, capillary     Status: Abnormal   Collection Time: 04/01/17  7:34 AM  Result Value Ref Range   Glucose-Capillary 100 (H) 65 - 99 mg/dL  Glucose, capillary     Status: Abnormal   Collection Time: 04/01/17 11:05 AM  Result Value Ref Range   Glucose-Capillary 120 (H) 65 - 99 mg/dL  Glucose, capillary     Status: Abnormal   Collection Time: 04/01/17  5:11 PM  Result Value Ref Range   Glucose-Capillary 110 (H) 65 - 99 mg/dL  Glucose, capillary     Status: Abnormal   Collection Time: 04/01/17  8:51 PM  Result Value Ref Range   Glucose-Capillary 125 (H) 65 - 99 mg/dL   Comment 1 Notify RN   Glucose, capillary     Status: Abnormal   Collection Time: 04/02/17 12:58 AM  Result Value Ref Range   Glucose-Capillary 120 (H) 65 - 99 mg/dL   Comment 1 Notify RN   Glucose, capillary     Status: None   Collection Time: 04/02/17  3:44 AM  Result Value Ref Range   Glucose-Capillary 98 65 - 99 mg/dL   Comment 1 Notify RN   CBC     Status: Abnormal   Collection Time: 04/02/17  4:20 AM  Result Value Ref Range   WBC 7.6 3.6 - 11.0 K/uL    RBC 3.68 (L) 3.80 - 5.20 MIL/uL   Hemoglobin  10.7 (L) 12.0 - 16.0 g/dL   HCT 33.0 (L) 35.0 - 47.0 %   MCV 89.6 80.0 - 100.0 fL   MCH 29.1 26.0 - 34.0 pg   MCHC 32.5 32.0 - 36.0 g/dL   RDW 16.6 (H) 11.5 - 14.5 %   Platelets 79 (L) 150 - 440 K/uL    Comment: PLATELET COUNT CONFIRMED BY SMEAR  Basic metabolic panel     Status: Abnormal   Collection Time: 04/02/17  4:20 AM  Result Value Ref Range   Sodium 143 135 - 145 mmol/L   Potassium 4.0 3.5 - 5.1 mmol/L   Chloride 110 101 - 111 mmol/L   CO2 28 22 - 32 mmol/L   Glucose, Bld 103 (H) 65 - 99 mg/dL   BUN 39 (H) 6 - 20 mg/dL   Creatinine, Ser 0.89 0.44 - 1.00 mg/dL   Calcium 9.1 8.9 - 10.3 mg/dL   GFR calc non Af Amer >60 >60 mL/min   GFR calc Af Amer >60 >60 mL/min    Comment: (NOTE) The eGFR has been calculated using the CKD EPI equation. This calculation has not been validated in all clinical situations. eGFR's persistently <60 mL/min signify possible Chronic Kidney Disease.    Anion gap 5 5 - 15  ACTH stimulation, 3 time points     Status: None   Collection Time: 04/02/17  4:52 AM  Result Value Ref Range   Cortisol, Base 80.0 ug/dL    Comment: NO NORMAL RANGE ESTABLISHED FOR THIS TEST RESULTS CONFIRMED BY MANUAL DILUTION    Cortisol, 30 Min 77.7 ug/dL    Comment: RESULTS CONFIRMED BY MANUAL DILUTION   Cortisol, 60 Min 69.2 ug/dL    Comment: RESULTS CONFIRMED BY MANUAL DILUTION Performed at [facility], [place] [address], [place], [place] [address]   Glucose, capillary     Status: None   Collection Time: 04/02/17  7:20 AM  Result Value Ref Range   Glucose-Capillary 89 65 - 99 mg/dL  Glucose, capillary     Status: None   Collection Time: 04/02/17 11:01 AM  Result Value Ref Range   Glucose-Capillary 86 65 - 99 mg/dL    Current Facility-Administered Medications  Medication Dose Route Frequency Provider Last Rate Last Dose  . acetaminophen (TYLENOL) tablet 650 mg  650 mg Oral Q6H PRN Gouru, Aruna, MD   650 mg at  03/31/17 1019   Or  . acetaminophen (TYLENOL) suppository 650 mg  650 mg Rectal Q6H PRN Gouru, Aruna, MD      . atropine 1 MG/10ML injection 0.5 mg  0.5 mg Intravenous PRN Max Sane, MD   0.5 mg at 04/02/17 1124  . chlorhexidine gluconate (MEDLINE KIT) (PERIDEX) 0.12 % solution 15 mL  15 mL Mouth Rinse BID Awilda Bill, NP   15 mL at 04/02/17 0900  . enoxaparin (LOVENOX) injection 40 mg  40 mg Subcutaneous Q12H Lenis Noon, RPH   40 mg at 04/02/17 0940  . hydrocortisone sodium succinate (SOLU-CORTEF) 100 MG injection 50 mg  50 mg Intravenous Q12H Wilhelmina Mcardle, MD      . iopamidol (ISOVUE-M) 61 % intrathecal injection 15 mL  15 mL Intrathecal Once PRN Max Sane, MD      . MEDLINE mouth rinse  15 mL Mouth Rinse QID Awilda Bill, NP   15 mL at 04/02/17 0400  . multivitamin with minerals tablet 1 tablet  1 tablet Oral Daily Max Sane, MD   1 tablet at  04/01/17 0857  . protein supplement (PREMIER PROTEIN) liquid  11 oz Oral TID BM Max Sane, MD   11 oz at 04/01/17 2000  . sodium chloride flush (NS) 0.9 % injection 10-40 mL  10-40 mL Intracatheter PRN Max Sane, MD   30 mL at 04/01/17 2123    Musculoskeletal: Strength & Muscle Tone: decreased Gait & Station: unable to stand Patient leans: N/A  Psychiatric Specialty Exam: Physical Exam  Nursing note and vitals reviewed. Constitutional: She appears well-developed and well-nourished.  HENT:  Head: Normocephalic and atraumatic.  Eyes: Pupils are equal, round, and reactive to light. Conjunctivae are normal.  Neck: Normal range of motion.  Cardiovascular: Regular rhythm and normal heart sounds.   Respiratory: Effort normal and breath sounds normal. No respiratory distress.  GI: Soft.  Musculoskeletal: Normal range of motion.  Neurological: She is alert.  Skin: Skin is warm and dry.  Psychiatric: Her affect is blunt. Her speech is delayed and tangential. She is slowed and withdrawn. Thought content is not paranoid.  Cognition and memory are impaired. She expresses no homicidal and no suicidal ideation. She exhibits abnormal recent memory.    Review of Systems  Constitutional: Positive for malaise/fatigue.  HENT: Negative.   Eyes: Negative.   Respiratory: Negative.   Cardiovascular: Negative.   Gastrointestinal: Negative.   Musculoskeletal: Negative.   Skin: Negative.   Neurological: Positive for weakness.  Psychiatric/Behavioral: Positive for hallucinations. Negative for depression, memory loss, substance abuse and suicidal ideas. The patient is not nervous/anxious and does not have insomnia.     Blood pressure 129/73, pulse (!) 43, temperature 97.8 F (36.6 C), temperature source Oral, resp. rate 16, height 5' 2"  (1.575 m), weight (!) 152.4 kg (336 lb), SpO2 94 %.Body mass index is 61.46 kg/m.  General Appearance: Casual  Eye Contact:  Fair  Speech:  Slow  Volume:  Decreased  Mood:  Euthymic  Affect:  Constricted  Thought Process:  Goal Directed  Orientation:  Negative  Thought Content:  Logical  Suicidal Thoughts:  No  Homicidal Thoughts:  No  Memory:  Immediate;   Fair Recent;   Fair Remote;   Fair  Judgement:  Fair  Insight:  Fair  Psychomotor Activity:  Decreased  Concentration:  Concentration: Poor  Recall:  [employer] of Knowledge:  Fair  Language:  Fair  Akathisia:  No  Handed:  Right  AIMS (if indicated):     Assets:  Desire for Improvement Resilience  ADL's:  Impaired  Cognition:  Impaired,  Mild and Moderate  Sleep:        Treatment Plan Summary: Medication management and Plan I have been holding off on restarting her psychiatric medicine as she has continued to be medically unstable.  Today her mental status actually looks better than it has in days despite not having restarted psychiatric medicine.  Given this and her continued bradycardia and unclear medical situation I will not restart any medicine yet.  Patient advised of the plan.  Right now she seems to be  behaving herself fine despite lack of psychiatric medicine.  We will continue to follow up regularly.  Disposition: No evidence of imminent risk to self or others at present.   Patient does not meet criteria for psychiatric inpatient admission.  Alethia Berthold, MD 04/02/2017 4:45 PM

## 2017-04-03 LAB — GLUCOSE, CAPILLARY
GLUCOSE-CAPILLARY: 154 mg/dL — AB (ref 65–99)
GLUCOSE-CAPILLARY: 91 mg/dL (ref 65–99)
Glucose-Capillary: 79 mg/dL (ref 65–99)
Glucose-Capillary: 103 mg/dL — ABNORMAL HIGH (ref 65–99)
Glucose-Capillary: 62 mg/dL — ABNORMAL LOW (ref 65–99)

## 2017-04-03 LAB — CBC
HCT: 35.8 % (ref 35.0–47.0)
Hemoglobin: 11.4 g/dL — ABNORMAL LOW (ref 12.0–16.0)
MCH: 28.7 pg (ref 26.0–34.0)
MCHC: 31.9 g/dL — ABNORMAL LOW (ref 32.0–36.0)
MCV: 90 fL (ref 80.0–100.0)
PLATELETS: 89 10*3/uL — AB (ref 150–440)
RBC: 3.98 MIL/uL (ref 3.80–5.20)
RDW: 16.3 % — AB (ref 11.5–14.5)
WBC: 12.1 10*3/uL — AB (ref 3.6–11.0)

## 2017-04-03 MED ORDER — PREMIER PROTEIN SHAKE
11.0000 [oz_av] | Freq: Two times a day (BID) | ORAL | Status: DC
  Administered 2017-04-05 – 2017-04-08 (×6): 11 [oz_av] via ORAL

## 2017-04-03 MED ORDER — DEXTROSE 50 % IV SOLN
50.0000 mL | Freq: Once | INTRAVENOUS | Status: AC
  Administered 2017-04-03: 50 mL via INTRAVENOUS
  Filled 2017-04-03: qty 50

## 2017-04-03 NOTE — Clinical Social Work Note (Signed)
CSW was able to reach Kit at [facility] and she stated that she would need to come by and evaluate patient before giving a definitive offer. Kit is going to call CSW once she arrived to [facility]. Shela Leff MSW,LCSW [phone]

## 2017-04-03 NOTE — Care Management (Signed)
This RNCM notified by PT that patient will need higher level of care and that GH/current living arrangements are not appropriate. CSW updated and aware- plan already in progress per CSW.

## 2017-04-03 NOTE — Progress Notes (Signed)
Finger stick reveals blood glucose of 61.  1 amp D50 administered.  Recheck reveals blood glucose of 91.  Will continue to monitor.

## 2017-04-03 NOTE — Progress Notes (Signed)
Pt remains bradycardic hr 49, however bp stable.  She did wear her CPAP overnight, and is agreeable to wear it again tonight.  She is awake and alert follows commands, confused to situation, lungs clear throughout. Cardiology has seen pt and recommendations noted, EP also consulted.  [facility] will follow and assist with management.  Sarah Hurst, AGNP  Pulmonary/Critical Care Pager [phone] (please enter 7 digits) PCCM Consult Pager [phone] (please enter 7 digits)   Sarah Fischeravid Ana Liaw, MD PCCM service Mobile [phone] Pager [phone] 04/03/2017 6:25 PM

## 2017-04-03 NOTE — Progress Notes (Signed)
    Rounded on patient this morning with MD. EP is not in the hospital today. Being seen by PT this morning. At rest, heart rate is in the 30s bpm with stable BP. With minimal movement in the bed, she demonstrates appropriate chronotropic response with heart rate into the upper 50s to mid 60s bpm. With ambulation to chair with PT heart rate was noted to be 72 bpm. Given appropriate chronotropic response with stable BP she would not need a PPM at this time. Cannot rule out underlying pathology playing a role in her presenting symptoms/signs. She was hypercarbic upon arrival. Perhaps there is some OHS. Maybe a repeat ABG would be beneficial. Does she need BiPAP? Is some of this medication related. Recommend further evaluation per IM. Continue to avoid rate-limiting medications. Would not continue to give atropine for bradycardia in the setting of stable BP. Discussed with MD.

## 2017-04-03 NOTE — Evaluation (Signed)
Physical Therapy Re-Evaluation Patient Details Name: Sarah Hurst MRN: [id number] DOB: [birth year] Today's Date: 04/03/2017   History of Present Illness  56 y.o. female with a history of mental disability, schizoaffective disorder, bipolar disorder brought from her family care home for recurrent falls.  EMS has been called multiple times in the last 2 weeks for "lifting assist."  Pt was evaluated in ED, needed to be admitted and had been very lethargic in CCU.    Clinical Impression  Pt is much more interactive today during re-eval than she had been with initial contact in ED last week. Unsure of pt's baseline, but assuredly she is much closer to it now.  She showed good effort and motivation with PT activities, but is weak and limited and needed a lot of general assist and cuing to insure safety.  Once pt got to sitting and then standing (both with considerable assist) she was able to take some small, guarded but relatively safe steps to transfer bed to recliner and [facility].  Overall she did better than expected from the eval in the ED, but pt is still very functionally limited and would not at all be safe in a group home setting at this point.     Follow Up Recommendations SNF    Equipment Recommendations       Recommendations for Other Services       Precautions / Restrictions Precautions Precautions: Fall Restrictions Weight Bearing Restrictions: No      Mobility  Bed Mobility Overal bed mobility: Needs Assistance Bed Mobility: Supine to Sit     Supine to sit: Mod assist     General bed mobility comments: Pt able to start getting LEs toward EOB, but ultimately did need assist  Transfers Overall transfer level: Needs assistance Equipment used: Rolling walker (2 wheeled) Transfers: Sit to/from Stand Sit to Stand: Mod assist         General transfer comment: Pt able to initiate movement, needed some light assist to get weight trasnfered to walker.  When  trying to get second hand to walker pt lost balance (on both attempts today) and needed signficant assist to stay upright  Ambulation/Gait Ambulation/Gait assistance: Min assist;Mod assist Ambulation Distance (Feet): 3 Feet Assistive device: Rolling walker (2 wheeled)       General Gait Details: Pt able to take a few shuffling/turning steps on 2 attempts transferring bed [facility]iner and [facility]  Stairs            Wheelchair Mobility    Modified Rankin (Stroke Patients Only)       Balance Overall balance assessment: Needs assistance Sitting-balance support: Bilateral upper extremity supported Sitting balance-Leahy Scale: Fair Sitting balance - Comments: Pt was able to keep herself upright at EOB once positioned     Standing balance-Leahy Scale: Fair Standing balance comment: Pt heavily reliant on walker to maintain balance                             Pertinent Vitals/Pain Pain Assessment: No/denies pain    Home Living Family/patient expects to be discharged to:: Skilled nursing facility Living Arrangements: Group Home               Additional Comments: Pt reports that she has been sleeping on couch?    Prior Function Level of Independence: Needs assistance   Gait / Transfers Assistance Needed: unsure of how much she was actually doing, but  apparently she could get to meals, etc in group home           Hand Dominance        Extremity/Trunk Assessment   Upper Extremity Assessment Upper Extremity Assessment: Generalized weakness (grossly 3+/5 in limited range)    Lower Extremity Assessment Lower Extremity Assessment: Generalized weakness (grossly 3+/5 )       Communication   Communication:  (slow to respond, but able to have meaningful conversation)  Cognition Arousal/Alertness: Awake/alert Behavior During Therapy: WFL for tasks assessed/performed Overall Cognitive Status: History of cognitive impairments - at baseline                                  General Comments: Pt appropriately interactive during session, generally delayed responses      General Comments      Exercises     Assessment/Plan    PT Assessment Patient needs continued PT services  PT Problem List Decreased strength;Decreased range of motion;Decreased activity tolerance;Decreased mobility;Decreased balance;Decreased coordination;Decreased cognition;Decreased knowledge of use of DME;Decreased safety awareness       PT Treatment Interventions DME instruction;Gait training;Functional mobility training;Therapeutic activities;Therapeutic exercise;Balance training;Neuromuscular re-education;Cognitive remediation;Patient/family education    PT Goals (Current goals can be found in the Care Plan section)  Acute Rehab PT Goals Patient Stated Goal: get stronger PT Goal Formulation: With patient Time For Goal Achievement: 04/17/17 Potential to Achieve Goals: Fair    Frequency Min 2X/week   Barriers to discharge        Co-evaluation               AM-PAC PT "6 Clicks" Daily Activity  Outcome Measure Difficulty turning over in bed (including adjusting bedclothes, sheets and blankets)?: Unable Difficulty moving from lying on back to sitting on the side of the bed? : Unable Difficulty sitting down on and standing up from a chair with arms (e.g., wheelchair, bedside commode, etc,.)?: Unable Help needed moving to and from a bed to chair (including a wheelchair)?: A Lot Help needed walking in hospital room?: A Lot Help needed climbing 3-5 steps with a railing? : Total 6 Click Score: 8    End of Session Equipment Utilized During Treatment: Gait belt Activity Tolerance: Patient limited by fatigue Patient left: with nursing/sitter in room (on [facility]) Nurse Communication: Mobility status PT Visit Diagnosis: Muscle weakness (generalized) (M62.81);Difficulty in walking, not elsewhere classified (R26.2)    Time:  4098-1191 PT Time Calculation (min) (ACUTE ONLY): 51 min   Charges:   PT Evaluation $PT Re-evaluation: 1 Re-eval PT Treatments $Gait Training: 8-22 mins $Therapeutic Activity: 8-22 mins   PT G Codes:   PT G-Codes **NOT FOR INPATIENT CLASS** Functional Assessment Tool Used: AM-PAC 6 Clicks Basic Mobility Functional Limitation: Mobility: Walking and moving around Mobility: Walking and Moving Around Current Status (Y7829): At least 80 percent but less than 100 percent impaired, limited or restricted Mobility: Walking and Moving Around Goal Status [phone]): At least 40 percent but less than 60 percent impaired, limited or restricted    Malachi Pro, DPT 04/03/2017, 1:40 PM

## 2017-04-03 NOTE — Clinical Social Work Note (Signed)
Patient appears to have a bed offer from [facility] in [place]. CSW has left message for the Admission's Coordinator to confirm this offer: Kit at [phone]. If a valid offer, CSW will contact patient's guardian Porfirio Mylar. Shela Leff MSW,LCSW [phone]

## 2017-04-03 NOTE — Progress Notes (Signed)
Nutrition Follow-up  DOCUMENTATION CODES:   Morbid obesity  INTERVENTION:  Provide Premier Protein po BID, each supplement provides 160 kcal and 30 grams of protein.  Continue daily MVI.  NUTRITION DIAGNOSIS:   Inadequate oral intake related to inability to eat (AMS) as evidenced by meal completion < 25%.  Improved. Patient now finishing >75% of meals per her report.  GOAL:   Patient will meet greater than or equal to 90% of their needs   Progressing.  MONITOR:   PO intake, Supplement acceptance, Labs, Weight trends  REASON FOR ASSESSMENT:   Rounds    ASSESSMENT:   56 yo female with PMH of OSA, Schizoaffective disorder, mental disability, GERD presented to [facility] with hypoglycemia, hypothermia, acute encephalopathy, and poor PO intake.  -On 10/31 pt transferred back to stepdown with bradycardia.  Met with patient at bedside. She reports her appetite is good. She is enjoying the food and eating well. Limited meal completion documented in chart. Patient reports she is finishing at least 75% of her meals. She enjoys the [employer] and continues to drink those. Denies any N/V, abdominal pain.  Medications reviewed and include: MVI daily.  Labs reviewed: CBG 79-154. On 10/31 phosphorus and magnesium were WNL.  No new weights since 10/29 to trend.  Patient was discussed on rounds today.  Diet Order:  Diet Heart Room service appropriate? Yes; Fluid consistency: Thin  EDUCATION NEEDS:   Not appropriate for education at this time  Skin:  Skin Assessment: Skin Integrity Issues: Skin Integrity Issues:: Other (Comment) Other: MSAD to abdomen and breast  Last BM:  04/03/2017 - medium type 6  Height:   Ht Readings from Last 1 Encounters:  03/24/17 5' 2"  (1.575 m)    Weight:   Wt Readings from Last 1 Encounters:  03/31/17 (!) 336 lb (152.4 kg)    Ideal Body Weight:  50 kg  BMI:  Body mass index is 61.46 kg/m.  Estimated Nutritional Needs:    Kcal:  2000-2300kcal/day   Protein:  >144g/day   Fluid:  >2L/day   Willey Blade, MS, RD, LDN Office: [phone] Pager: [phone] After Hours/Weekend Pager: [phone]

## 2017-04-03 NOTE — Progress Notes (Signed)
Patient ID: Sarah Hurst, female   DOB: 12/28/1960, 56 y.o.   MRN: [id number]  [employer] PROGRESS NOTE  Sarah Hurst YTK:[id number] DOB: August 01, 1960 DOA: 03/24/2017 PCP: System, Pcp Not In  HPI/Subjective: Patient seen prior to lunch.  She feels okay and offers no complaints.  Not the best historian.  Patient stated yes to most questions including chest pain shortness of breath abdominal pain and constipation but she had a bowel movement today  Objective: Vitals:   04/03/17 1159 04/03/17 1200  BP:  129/78  Pulse:  (!) 45  Resp:  14  Temp: 97.6 F (36.4 C)   SpO2:  96%    Filed Weights   03/24/17 1210 03/31/17 1051  Weight: (!) 143.8 kg (317 lb) (!) 152.4 kg (336 lb)    ROS: Review of Systems  Unable to perform ROS: Mental acuity  Respiratory: Positive for shortness of breath.   Cardiovascular: Positive for chest pain.  Gastrointestinal: Positive for abdominal pain, constipation and nausea. Negative for diarrhea and vomiting.  Musculoskeletal: Negative for joint pain.   Exam: Physical Exam  Constitutional: She is oriented to person, place, and time.  HENT:  Nose: No mucosal edema.  Mouth/Throat: No oropharyngeal exudate or posterior oropharyngeal edema.  Eyes: Pupils are equal, round, and reactive to light. Conjunctivae, EOM and lids are normal.  Neck: No JVD present. Carotid bruit is not present. No edema present. No thyroid mass and no thyromegaly present.  Cardiovascular: S1 normal and S2 normal.  Bradycardia present.  Exam reveals no gallop.   Murmur heard.  Systolic murmur is present with a grade of 2/6  Pulses:      Dorsalis pedis pulses are 2+ on the right side, and 2+ on the left side.  Respiratory: No respiratory distress. She has decreased breath sounds in the right lower field and the left lower field. She has no wheezes. She has no rhonchi. She has no rales.  GI: Soft. Bowel sounds are normal. There is no tenderness.  Musculoskeletal:       Right  ankle: She exhibits swelling.       Left ankle: She exhibits swelling.  Lymphadenopathy:    She has no cervical adenopathy.  Neurological: She is alert and oriented to person, place, and time. No cranial nerve deficit.  Skin: Skin is warm. No rash noted. Nails show no clubbing.  Psychiatric: She has a normal mood and affect.      Data Reviewed: Basic Metabolic Panel:  Recent Labs Lab 03/28/17 0410 03/29/17 0355 03/31/17 0448 04/01/17 0532 04/02/17 0420  NA 148* 146* 145 143 143  K 4.1 4.3 4.1 4.1 4.0  CL 119* 113* 113* 112* 110  CO2 _0 GLUCOSE 91 163* 109* 98 103*  BUN 12 12 26* 39* 39*  CREATININE 1.12* 0.80 0.83 0.95 0.89  CALCIUM 8.1* 8.4* 9.2 9.2 9.1  MG  --  1.4*  --  2.3  --   PHOS  --  2.3*  --  3.0  --    Liver Function Tests:  Recent Labs Lab 03/28/17 0410  AST 50*  ALT 41  ALKPHOS 51  BILITOT 0.3  PROT 5.4*  ALBUMIN 2.4*   CBC:  Recent Labs Lab 03/28/17 0410 03/29/17 0355 04/01/17 0532 04/02/17 0420 04/03/17 1156  WBC 5.6 6.5 7.5 7.6 12.1*  NEUTROABS 4.0  --   --   --   --   HGB 10.5* 10.4* 10.4* 10.7* 11.4*  HCT 33.7* 32.4* 32.6*  33.0* 35.8  MCV 90.4 91.0 90.1 89.6 90.0  PLT 101* 80* 77* 79* 89*    CBG:  Recent Labs Lab 04/02/17 0344 04/02/17 0720 04/02/17 1101 04/03/17 0745 04/03/17 1156  GLUCAP 98 89 86 79 154*    Recent Results (from the past 240 hour(s))  MRSA PCR Screening     Status: None   Collection Time: 03/27/17 12:17 PM  Result Value Ref Range Status   MRSA by PCR NEGATIVE NEGATIVE Final    Comment:        The GeneXpert MRSA Assay (FDA approved for NASAL specimens only), is one component of a comprehensive MRSA colonization surveillance program. It is not intended to diagnose MRSA infection nor to guide or monitor treatment for MRSA infections.   CULTURE, BLOOD (ROUTINE X 2) w Reflex to ID Panel     Status: Abnormal   Collection Time: 03/27/17  1:51 PM  Result Value Ref Range Status    Specimen Description BLOOD RIGHT HAND  Final   Special Requests   Final    BOTTLES DRAWN AEROBIC AND ANAEROBIC Blood Culture adequate volume   Culture  Setup Time   Final    GRAM POSITIVE COCCI AEROBIC BOTTLE ONLY CRITICAL RESULT CALLED TO, READ BACK BY AND VERIFIED WITH: LISA CLUTTS AT 1605 03/28/17. MSS    Culture (A)  Final    STAPHYLOCOCCUS SPECIES (COAGULASE NEGATIVE) THE SIGNIFICANCE OF ISOLATING THIS ORGANISM FROM A SINGLE SET OF BLOOD CULTURES WHEN MULTIPLE SETS ARE DRAWN IS UNCERTAIN. PLEASE NOTIFY THE MICROBIOLOGY DEPARTMENT WITHIN ONE WEEK IF SPECIATION AND SENSITIVITIES ARE REQUIRED. Performed at [facility], [place] [address] [place], [place] [address]    Report Status 03/30/2017 FINAL  Final  Blood Culture ID Panel (Reflexed)     Status: Abnormal   Collection Time: 03/27/17  1:51 PM  Result Value Ref Range Status   Enterococcus species NOT DETECTED NOT DETECTED Final   Listeria monocytogenes NOT DETECTED NOT DETECTED Final   Staphylococcus species DETECTED (A) NOT DETECTED Final    Comment: Methicillin (oxacillin) susceptible coagulase negative staphylococcus. Possible blood culture contaminant (unless isolated from more than one blood culture draw or clinical case suggests pathogenicity). No antibiotic treatment is indicated for blood  culture contaminants. CRITICAL RESULT CALLED TO, READ BACK BY AND VERIFIED WITH: LISA CLUTTS AT 1605 03/28/17. MSS    Staphylococcus aureus NOT DETECTED NOT DETECTED Final   Methicillin resistance NOT DETECTED NOT DETECTED Final   Streptococcus species NOT DETECTED NOT DETECTED Final   Streptococcus agalactiae NOT DETECTED NOT DETECTED Final   Streptococcus pneumoniae NOT DETECTED NOT DETECTED Final   Streptococcus pyogenes NOT DETECTED NOT DETECTED Final   Acinetobacter baumannii NOT DETECTED NOT DETECTED Final   Enterobacteriaceae species NOT DETECTED NOT DETECTED Final   Enterobacter cloacae complex NOT DETECTED NOT DETECTED  Final   Escherichia coli NOT DETECTED NOT DETECTED Final   Klebsiella oxytoca NOT DETECTED NOT DETECTED Final   Klebsiella pneumoniae NOT DETECTED NOT DETECTED Final   Proteus species NOT DETECTED NOT DETECTED Final   Serratia marcescens NOT DETECTED NOT DETECTED Final   Haemophilus influenzae NOT DETECTED NOT DETECTED Final   Neisseria meningitidis NOT DETECTED NOT DETECTED Final   Pseudomonas aeruginosa NOT DETECTED NOT DETECTED Final   Candida albicans NOT DETECTED NOT DETECTED Final   Candida glabrata NOT DETECTED NOT DETECTED Final   Candida krusei NOT DETECTED NOT DETECTED Final   Candida parapsilosis NOT DETECTED NOT DETECTED Final   Candida tropicalis NOT DETECTED  NOT DETECTED Final  Urine Culture     Status: None   Collection Time: 03/27/17  2:41 PM  Result Value Ref Range Status   Specimen Description URINE, RANDOM  Final   Special Requests NONE  Final   Culture   Final    NO GROWTH Performed at [facility], [address] [place], [place] [address]    Report Status 03/28/2017 FINAL  Final  CULTURE, BLOOD (ROUTINE X 2) w Reflex to ID Panel     Status: None   Collection Time: 03/27/17  3:10 PM  Result Value Ref Range Status   Specimen Description BLOOD BLOOD RIGHT HAND  Final   Special Requests   Final    BOTTLES DRAWN AEROBIC AND ANAEROBIC Blood Culture adequate volume   Culture NO GROWTH 5 DAYS  Final   Report Status 04/01/2017 FINAL  Final     Studies: Ct Abdomen Pelvis W Contrast  Result Date: 04/02/2017 CLINICAL DATA:  Abdominal pain, nausea, vomiting. EXAM: CT ABDOMEN AND PELVIS WITH CONTRAST TECHNIQUE: Multidetector CT imaging of the abdomen and pelvis was performed using the standard protocol following bolus administration of intravenous contrast. COMPARISON:  Plain films 03/24/2017 FINDINGS: Lower chest: Heart is borderline in size. Dependent atelectasis in the lower lobes. No effusions. Hepatobiliary: Heterogeneous density and enhancement pattern  throughout the liver. This may reflect geographic fatty infiltration. No measurable focal mass lesion. Gallbladder unremarkable. Pancreas: No focal abnormality or ductal dilatation. Spleen: No focal abnormality.  Normal size. Adrenals/Urinary Tract: No adrenal abnormality. No focal renal abnormality. No stones or hydronephrosis. Urinary bladder is unremarkable. Foley catheter present in the bladder which is decompressed. Stomach/Bowel: Few scattered left colonic diverticula. No active diverticulitis. Appendix is normal. Stomach and small bowel decompressed, unremarkable. Vascular/Lymphatic: No evidence of aneurysm or adenopathy. Reproductive: Uterus and adnexa unremarkable.  No mass. Other:  Trace free fluid in the pelvis.  No free air. Musculoskeletal: No acute bony abnormality. Advanced degenerative disc and facet disease in the lower lumbar spine. IMPRESSION: Heterogeneous density and enhancement throughout the liver, suspect geographic fatty infiltration of the liver. Dependent bibasilar atelectasis. Scattered left colonic diverticula.  No active diverticulitis. Trace free fluid in the pelvis. Electronically Signed   By: Rolm Baptise M.D.   On: 04/02/2017 10:57    Scheduled Meds: . chlorhexidine gluconate (MEDLINE KIT)  15 mL Mouth Rinse BID  . enoxaparin (LOVENOX) injection  40 mg Subcutaneous Q12H  . mouth rinse  15 mL Mouth Rinse QID  . multivitamin with minerals  1 tablet Oral Daily  . protein supplement shake  11 oz Oral TID BM   Continuous Infusions:  Assessment/Plan:  1. Bradycardia with good blood pressure.  Seen by cardiology.  Patient feels okay but not the best historian to give good information.  Heart rate does come up with exercise. 2. Clinical sepsis ruled out.  Patient was on empiric antibiotics and this was discontinued. 3. Hypoglycemia and hypothermia.  Patient is now eating and sugars have improved.  Off D5 drip.  Off stress dose steroids. 4. Acute encephalopathy this has  improved from being unresponsive on presentation. 5. Schizoaffective disorder.  Consider restarting psychiatric medications 6. History of sleep apnea 7. History of GERD 8. Thrombocytopenia.  Consider sending off a hepatitis C profile 9. Nursing staff state they going to take out the Foley catheter today  Code Status:     Code Status Orders        Start     Ordered  03/27/17 1700  Full code  Continuous     03/27/17 1659    Code Status History    Date Active Date Inactive Code Status Order ID Comments User Context   01/24/2017  5:35 PM 01/28/2017  8:51 PM Full Code [id number]  Julianne Rice, MD ED   01/10/2017 10:02 PM 01/11/2017  6:32 PM Full Code [id number]  Daleen Bo, MD ED   12/26/2016  5:18 PM 12/26/2016 11:20 PM Full Code [id number]  Julianne Rice, MD ED   12/13/2016  3:16 PM 12/14/2016 11:02 PM Full Code [id number]  Noemi Chapel, MD ED    Advance Directive Documentation     Most Recent Value  Type of Advance Directive  Living will  Pre-existing out of facility DNR order (yellow form or pink MOST form)  -  "MOST" Form in Place?  -     Family Communication: As per critical care team Disposition Plan: Will need rehab  Consultants:  Critical care team  Cardiology  Time spent: 25 minutes  [place], [place]

## 2017-04-04 LAB — GLUCOSE, CAPILLARY
GLUCOSE-CAPILLARY: 45 mg/dL — AB (ref 65–99)
GLUCOSE-CAPILLARY: 98 mg/dL (ref 65–99)
GLUCOSE-CAPILLARY: 62 mg/dL — AB (ref 65–99)
GLUCOSE-CAPILLARY: 76 mg/dL (ref 65–99)
Glucose-Capillary: 32 mg/dL — CL (ref 65–99)
Glucose-Capillary: 54 mg/dL — ABNORMAL LOW (ref 65–99)
Glucose-Capillary: 59 mg/dL — ABNORMAL LOW (ref 65–99)
Glucose-Capillary: 106 mg/dL — ABNORMAL HIGH (ref 65–99)

## 2017-04-04 MED ORDER — FAMOTIDINE 20 MG PO TABS
20.0000 mg | ORAL_TABLET | Freq: Every day | ORAL | Status: DC
  Administered 2017-04-04: 20 mg via ORAL
  Filled 2017-04-04: qty 1

## 2017-04-04 MED ORDER — HYDROCORTISONE NA SUCCINATE PF 100 MG IJ SOLR
50.0000 mg | Freq: Every day | INTRAMUSCULAR | Status: DC
Start: 2017-04-04 — End: 2017-04-05
  Administered 2017-04-04 – 2017-04-05 (×2): 50 mg via INTRAVENOUS
  Filled 2017-04-04: qty 1
  Filled 2017-04-04: qty 2

## 2017-04-04 MED ORDER — DEXTROSE 10 % IV SOLN
INTRAVENOUS | Status: DC
  Administered 2017-04-04: 12:00:00 via INTRAVENOUS

## 2017-04-04 MED ORDER — DEXTROSE 50 % IV SOLN
1.0000 | Freq: Once | INTRAVENOUS | Status: AC
  Administered 2017-04-04: 50 mL via INTRAVENOUS

## 2017-04-04 MED ORDER — DEXTROSE 50 % IV SOLN
INTRAVENOUS | Status: AC
  Filled 2017-04-04: qty 50

## 2017-04-04 MED ORDER — FAMOTIDINE 20 MG PO TABS
20.0000 mg | ORAL_TABLET | Freq: Two times a day (BID) | ORAL | Status: DC
  Administered 2017-04-04 – 2017-04-08 (×8): 20 mg via ORAL
  Filled 2017-04-04 (×8): qty 1

## 2017-04-04 MED ORDER — ONDANSETRON HCL 4 MG/2ML IJ SOLN
4.0000 mg | Freq: Four times a day (QID) | INTRAMUSCULAR | Status: DC | PRN
  Filled 2017-04-04: qty 2

## 2017-04-04 MED ORDER — ALUM & MAG HYDROXIDE-SIMETH 200-200-20 MG/5ML PO SUSP
30.0000 mL | ORAL | Status: DC | PRN
  Administered 2017-04-04: 30 mL via ORAL
  Filled 2017-04-04 (×2): qty 30

## 2017-04-04 NOTE — Consult Note (Signed)
[facility] Consult   Reason for Consult: Follow-up consult 56 year old woman with chronic mental health problems who is in the hospital recovering from a septic-like or shock like picture. Referring Physician:  Leslye Peer Patient Identification: Sarah Hurst MRN:  [id number] Principal Diagnosis: Schizoaffective disorder, bipolar type [facility]) Diagnosis:   Patient Active Problem List   Diagnosis Date Noted  . Hypercarbia [R06.89]   . Hypoglycemia [E16.2] 03/27/2017  . Schizoaffective disorder, bipolar type ([place]) [F25.0] 03/24/2017    Total Time spent with patient: 30 minutes  Subjective:   Sarah Hurst is a 56 y.o. female patient admitted with "a therapist helped me walk".  HPI: Patient seen chart reviewed.  Patient seems to be doing much better.  She was awake and alert and interactive.  Made good eye contact.  Answered questions appropriately.  Indicated that she was pleased that having gotten up and been able to try to stand up today.  Still says that she prefers not to go back to her group home because they make her sleep on a sofa.  Not reporting suicidal or homicidal ideation.  Not grossly psychotic.  Past Psychiatric History: History of schizoaffective disorder.  Patient is usually maintained on long-acting injectable medication  Risk to Self: Is patient at risk for suicide?: No Risk to Others:   Prior Inpatient Therapy:   Prior Outpatient Therapy:    Past Medical History:  Past Medical History:  Diagnosis Date  . Asthma   . GERD (gastroesophageal reflux disease)   . Mental disability   . Schizoaffective disorder, bipolar type ([place])   . Sleep apnea     Past Surgical History:  Procedure Laterality Date  . WRIST SURGERY     Family History: History reviewed. No pertinent family history. Family Psychiatric  History: Unknown Social History:  History  Alcohol Use  . Yes    Comment: 1 cup of wine occasionally     History  Drug Use No    Social History    Social History  . Marital status: Single    Spouse name: N/A  . Number of children: N/A  . Years of education: N/A   Social History Main Topics  . Smoking status: Former Research scientist (life sciences)  . Smokeless tobacco: Never Used  . Alcohol use Yes     Comment: 1 cup of wine occasionally  . Drug use: No  . Sexual activity: Not Asked   Other Topics Concern  . None   Social History Narrative  . None   Additional Social History:    Allergies:  No Known Allergies  Labs:  Results for orders placed or performed during the hospital encounter of 03/24/17 (from the past 48 hour(s))  Glucose, capillary     Status: None   Collection Time: 04/03/17  7:45 AM  Result Value Ref Range   Glucose-Capillary 79 65 - 99 mg/dL  CBC     Status: Abnormal   Collection Time: 04/03/17 11:56 AM  Result Value Ref Range   WBC 12.1 (H) 3.6 - 11.0 K/uL   RBC 3.98 3.80 - 5.20 MIL/uL   Hemoglobin 11.4 (L) 12.0 - 16.0 g/dL   HCT 35.8 35.0 - 47.0 %   MCV 90.0 80.0 - 100.0 fL   MCH 28.7 26.0 - 34.0 pg   MCHC 31.9 (L) 32.0 - 36.0 g/dL   RDW 16.3 (H) 11.5 - 14.5 %   Platelets 89 (L) 150 - 440 K/uL  Glucose, capillary     Status: Abnormal   Collection Time:  04/03/17 11:56 AM  Result Value Ref Range   Glucose-Capillary 154 (H) 65 - 99 mg/dL  Glucose, capillary     Status: Abnormal   Collection Time: 04/03/17  6:02 PM  Result Value Ref Range   Glucose-Capillary 103 (H) 65 - 99 mg/dL  Glucose, capillary     Status: Abnormal   Collection Time: 04/03/17  9:20 PM  Result Value Ref Range   Glucose-Capillary 62 (L) 65 - 99 mg/dL  Glucose, capillary     Status: None   Collection Time: 04/03/17 10:46 PM  Result Value Ref Range   Glucose-Capillary 91 65 - 99 mg/dL  Glucose, capillary     Status: Abnormal   Collection Time: 04/04/17  7:37 AM  Result Value Ref Range   Glucose-Capillary 31 (LL) 65 - 99 mg/dL   Comment 1 Notify RN    Comment 2 Repeat Test   Glucose, capillary     Status: Abnormal   Collection Time:  04/04/17  7:38 AM  Result Value Ref Range   Glucose-Capillary 45 (L) 65 - 99 mg/dL  Glucose, capillary     Status: Abnormal   Collection Time: 04/04/17  8:02 AM  Result Value Ref Range   Glucose-Capillary 32 (LL) 65 - 99 mg/dL  Glucose, capillary     Status: Abnormal   Collection Time: 04/04/17  8:40 AM  Result Value Ref Range   Glucose-Capillary 54 (L) 65 - 99 mg/dL  Glucose, capillary     Status: None   Collection Time: 04/04/17  8:43 AM  Result Value Ref Range   Glucose-Capillary 98 65 - 99 mg/dL  Glucose, capillary     Status: Abnormal   Collection Time: 04/04/17 11:26 AM  Result Value Ref Range   Glucose-Capillary 26 (LL) 65 - 99 mg/dL   Comment 1 Notify RN    Comment 2 Repeat Test   Glucose, capillary     Status: Abnormal   Collection Time: 04/04/17 11:29 AM  Result Value Ref Range   Glucose-Capillary 59 (L) 65 - 99 mg/dL  Glucose, capillary     Status: Abnormal   Collection Time: 04/04/17 12:01 PM  Result Value Ref Range   Glucose-Capillary 106 (H) 65 - 99 mg/dL  Glucose, capillary     Status: Abnormal   Collection Time: 04/04/17  4:43 PM  Result Value Ref Range   Glucose-Capillary 62 (L) 65 - 99 mg/dL    Current Facility-Administered Medications  Medication Dose Route Frequency Provider Last Rate Last Dose  . acetaminophen (TYLENOL) tablet 650 mg  650 mg Oral Q6H PRN Gouru, Aruna, MD   650 mg at 03/31/17 1019   Or  . acetaminophen (TYLENOL) suppository 650 mg  650 mg Rectal Q6H PRN Gouru, Aruna, MD      . alum & mag hydroxide-simeth (MAALOX/MYLANTA) 200-200-20 MG/5ML suspension 30 mL  30 mL Oral Q4H PRN Varughese, Bincy S, NP   30 mL at 04/04/17 0755  . chlorhexidine gluconate (MEDLINE KIT) (PERIDEX) 0.12 % solution 15 mL  15 mL Mouth Rinse BID Awilda Bill, NP   15 mL at 04/04/17 0800  . dextrose 10 % infusion   Intravenous Continuous Loletha Grayer, MD 25 mL/hr at 04/04/17 1201    . enoxaparin (LOVENOX) injection 40 mg  40 mg Subcutaneous Q12H Lenis Noon, RPH   40 mg at 04/04/17 1008  . famotidine (PEPCID) tablet 20 mg  20 mg Oral BID Loletha Grayer, MD      . hydrocortisone sodium succinate (  SOLU-CORTEF) 100 MG injection 50 mg  50 mg Intravenous Daily Loletha Grayer, MD   50 mg at 04/04/17 1202  . iopamidol (ISOVUE-M) 61 % intrathecal injection 15 mL  15 mL Intrathecal Once PRN Max Sane, MD      . MEDLINE mouth rinse  15 mL Mouth Rinse QID Awilda Bill, NP   15 mL at 04/03/17 0532  . multivitamin with minerals tablet 1 tablet  1 tablet Oral Daily Max Sane, MD   1 tablet at 04/04/17 1007  . ondansetron (ZOFRAN) injection 4 mg  4 mg Intravenous Q6H PRN Wieting, Richard, MD      . protein supplement (PREMIER PROTEIN) liquid  11 oz Oral BID BM Wilhelmina Mcardle, MD      . sodium chloride flush (NS) 0.9 % injection 10-40 mL  10-40 mL Intracatheter PRN Max Sane, MD   30 mL at 04/01/17 2123    Musculoskeletal: Strength & Muscle Tone: within normal limits Gait & Station: unable to stand Patient leans: N/A  Psychiatric Specialty Exam: Physical Exam  Nursing note and vitals reviewed. Constitutional: She appears well-developed and well-nourished.  HENT:  Head: Normocephalic and atraumatic.  Eyes: Pupils are equal, round, and reactive to light. Conjunctivae are normal.  Neck: Normal range of motion.  Cardiovascular: Regular rhythm and normal heart sounds.   Respiratory: Effort normal. No respiratory distress.  GI: Soft.  Musculoskeletal: Normal range of motion.  Neurological: She is alert.  Skin: Skin is warm and dry.  Psychiatric: She has a normal mood and affect. Her speech is delayed. She is slowed. Thought content is not paranoid. Cognition and memory are impaired. She expresses no homicidal and no suicidal ideation.    Review of Systems  Constitutional: Negative.   HENT: Negative.   Eyes: Negative.   Respiratory: Negative.   Cardiovascular: Negative.   Gastrointestinal: Negative.   Musculoskeletal: Negative.    Skin: Negative.   Neurological: Negative.   Psychiatric/Behavioral: Negative for depression, hallucinations, memory loss, substance abuse and suicidal ideas. The patient is not nervous/anxious and does not have insomnia.     Blood pressure 108/68, pulse 62, temperature 97.7 F (36.5 C), temperature source Oral, resp. rate 20, height 5' 2"  (1.575 m), weight (!) 152.4 kg (336 lb), SpO2 97 %.Body mass index is 61.46 kg/m.  General Appearance: Casual  Eye Contact:  Fair  Speech:  Slow  Volume:  Decreased  Mood:  Euthymic  Affect:  Constricted  Thought Process:  Goal Directed  Orientation:  Full (Time, Place, and Person)  Thought Content:  Illogical  Suicidal Thoughts:  No  Homicidal Thoughts:  No  Memory:  Immediate;   Fair Recent;   Fair Remote;   Fair  Judgement:  Fair  Insight:  Fair  Psychomotor Activity:  Decreased  Concentration:  Concentration: Fair  Recall:  [employer] of Knowledge:  Fair  Language:  Fair  Akathisia:  No  Handed:  Right  AIMS (if indicated):     Assets:  Desire for Improvement Housing Social Support  ADL's:  Impaired  Cognition:  Impaired,  Mild  Sleep:        Treatment Plan Summary: Plan Patient seems to be improving in her mental state every day.  I am holding off starting any antipsychotics since she is usually maintained on long-acting injectables and probably still has her current dose in her system.  Currently seems to be coming back to herself and no sign of acute dangerousness.  Would not recommend  any medication changes necessarily prior to discharge.  Disposition: Patient does not meet criteria for psychiatric inpatient admission. Supportive therapy provided about ongoing stressors.  Alethia Berthold, MD 04/04/2017 7:06 PM

## 2017-04-04 NOTE — Progress Notes (Signed)
No distress No complaints Sinus brady persists  Vitals:   04/04/17 0741 04/04/17 0800 04/04/17 0900 04/04/17 1000  BP:  131/76 100/79 113/74  Pulse: (!) 48 (!) 50 (!) 53   Resp: 12 15 13 18   Temp: (!) 97.5 F (36.4 C)     TempSrc: Oral     SpO2: 99% 95% 100%   Weight:      Height:       NAD HEENT WNL JVP cannot be visualized Chest clear Obese, NABS, soft Ext warm No focal neurological deficits  BMP Latest Ref Rng & Units 04/02/2017 04/01/2017 03/31/2017  Glucose 65 - 99 mg/dL 161(W103(H) 98 960(A109(H)  BUN 6 - 20 mg/dL 54(U39(H) 98(J39(H) 19(J26(H)  Creatinine 0.44 - 1.00 mg/dL 4.780.89 2.950.95 6.210.83  Sodium 135 - 145 mmol/L 143 143 145  Potassium 3.5 - 5.1 mmol/L 4.0 4.1 4.1  Chloride 101 - 111 mmol/L 110 112(H) 113(H)  CO2 22 - 32 mmol/L 28 28 27   Calcium 8.9 - 10.3 mg/dL 9.1 9.2 9.2    CBC Latest Ref Rng & Units 04/03/2017 04/02/2017 04/01/2017  WBC 3.6 - 11.0 K/uL 12.1(H) 7.6 7.5  Hemoglobin 12.0 - 16.0 g/dL 11.4(L) 10.7(L) 10.4(L)  Hematocrit 35.0 - 47.0 % 35.8 33.0(L) 32.6(L)  Platelets 150 - 440 K/uL 89(L) 79(L) 77(L)    No new CXR  IMPRESSION: Sinus bradycardia - per cardiology, no plans for PM. OK to move to med-surg  PLAN/REC: Transfer to Med-surg with cardiac monitoring Discussed with Dr Hilton SinclairWeiting - floor RNs will need reassurance that asymptomatic bradycardia is not a reason to transfer back to ICU/SDU  Billy Fischeravid Simonds, MD PCCM service Mobile [phone] Pager [phone] 04/04/2017 11:35 AM

## 2017-04-04 NOTE — Progress Notes (Signed)
    Telemetry reviewed. HR variable from the high-30's to 70's. No evidence of CHB. Reserve Atropine for symptomatic bradycardia. Continue to follow on telemetry.  Signed, Ellsworth LennoxBrittany M Strader, PA-C 04/04/2017, 10:32 AM

## 2017-04-04 NOTE — Clinical Social Work Note (Signed)
CSW contacted Kitt at [facility] and she stated she would be able to come by Monday to assess patient. CSW updated her regarding patient's unit and room change.  York SpanielMonica Tyjai Charbonnet MSW,LCSW [phone]

## 2017-04-04 NOTE — Progress Notes (Signed)
Physical Therapy Treatment Patient Details Name: Sarah Hurst MRN: [id number] DOB: [birth year] Today's Date: 04/04/2017    History of Present Illness 56 y.o. female with a history of mental disability, schizoaffective disorder, bipolar disorder brought from her family care home for recurrent falls.  EMS has been called multiple times in the last 2 weeks for "lifting assist."  Pt was evaluated in ED, needed to be admitted and had been very lethargic in CCU.      PT Comments    Pt with typical delayed responses and need for repeated/excessive cuing but ultimately she was very eager to work with PT and did well despite weakness.  She has limited R knee ROM with some pain during exercises but generally showed great effort with supine exercises and also with standing exercises.  She struggled to fully follow some instruction with each but ultimately showed improvement with all aspects of PT.  Pt very weak and functionally limited, but did better than expected and showed great willingness to try all requested activities.    Follow Up Recommendations  SNF     Equipment Recommendations       Recommendations for Other Services       Precautions / Restrictions Precautions Precautions: Fall Restrictions Weight Bearing Restrictions: No    Mobility  Bed Mobility Overal bed mobility: Needs Assistance Bed Mobility: Supine to Sit;Sit to Supine     Supine to sit: Mod assist Sit to supine: Max assist   General bed mobility comments: Pt better able to get LEs moving toward EOB, shows great effort  Transfers Overall transfer level: Needs assistance Equipment used: Rolling walker (2 wheeled) Transfers: Sit to/from Stand Sit to Stand: Mod assist         General transfer comment: Pt needing heavy cuing for set up and then assist to shift hips forward once she begins to stand, but ultimately she was able to give a great effort and get to standing  Ambulation/Gait Ambulation/Gait  assistance: Mod assist;Min assist   Assistive device: Rolling walker (2 wheeled)       General Gait Details: Avoided ambulation away from EOB for safety we were able to take ~4 ft of side steps as well as repeated foot forward/back marching.  She was ultimately standing for >5 minutes and though she had some fatigue and needed heavy cuing she did well and showed great effort.    Stairs            Wheelchair Mobility    Modified Rankin (Stroke Patients Only)       Balance Overall balance assessment: Needs assistance Sitting-balance support: Bilateral upper extremity supported Sitting balance-Leahy Scale: Good Sitting balance - Comments: Pt was able to keep herself upright at EOB once positioned     Standing balance-Leahy Scale: Fair Standing balance comment: Pt heavily reliant on walker to maintain balance                            Cognition Arousal/Alertness: Awake/alert Behavior During Therapy: WFL for tasks assessed/performed Overall Cognitive Status: History of cognitive impairments - at baseline                                        Exercises General Exercises - Lower Extremity Ankle Circles/Pumps: AROM;15 reps;Both Short Arc Quad: AROM;Strengthening;10 reps Heel Slides: AROM;Strengthening;10 reps Hip ABduction/ADduction: AROM;Strengthening;10 reps Straight Leg  Raises: AAROM;AROM;10 reps Hip Flexion/Marching: Standing;10 reps;AROM    General Comments        Pertinent Vitals/Pain Pain Assessment:  (general chronic knee pain R>L)    Home Living                      Prior Function            PT Goals (current goals can now be found in the care plan section) Progress towards PT goals: Progressing toward goals    Frequency    Min 2X/week      PT Plan Current plan remains appropriate    Co-evaluation              AM-PAC PT "6 Clicks" Daily Activity  Outcome Measure  Difficulty turning over in  bed (including adjusting bedclothes, sheets and blankets)?: Unable Difficulty moving from lying on back to sitting on the side of the bed? : Unable Difficulty sitting down on and standing up from a chair with arms (e.g., wheelchair, bedside commode, etc,.)?: Unable Help needed moving to and from a bed to chair (including a wheelchair)?: A Lot Help needed walking in hospital room?: A Lot Help needed climbing 3-5 steps with a railing? : Total 6 Click Score: 8    End of Session Equipment Utilized During Treatment: Gait belt Activity Tolerance: Patient limited by fatigue Patient left: with bed alarm set;with call bell/phone within reach Nurse Communication: Mobility status PT Visit Diagnosis: Muscle weakness (generalized) (M62.81);Difficulty in walking, not elsewhere classified (R26.2)     Time: 1027-2536 PT Time Calculation (min) (ACUTE ONLY): 40 min  Charges:  $Gait Training: 8-22 mins $Therapeutic Exercise: 8-22 mins $Therapeutic Activity: 8-22 mins                    G Codes:       Malachi Pro, DPT 04/04/2017, 5:32 PM

## 2017-04-04 NOTE — Progress Notes (Addendum)
Patient ID: Sarah Hurst, female   DOB: 12/23/1960, 56 y.o.   MRN: [id number]  [facility] PROGRESS NOTE  Sarah Hurst JQG:[id number] DOB: 12-17-1960 DOA: 03/24/2017 PCP: System, Pcp Not In  HPI/Subjective: Patient had nausea vomiting today.  Nursing checked her fingerstick and it was very low.  The patient does complain of some abdominal pain.  There were orders to transfer her to the floor.  Objective: Vitals:   04/04/17 0900 04/04/17 1000  BP: 100/79 113/74  Pulse: (!) 53   Resp: 13 18  Temp:    SpO2: 100%     Filed Weights   03/24/17 1210 03/31/17 1051  Weight: (!) 143.8 kg (317 lb) (!) 152.4 kg (336 lb)    ROS: Review of Systems  Unable to perform ROS: Mental acuity  Respiratory: Positive for shortness of breath.   Cardiovascular: Positive for chest pain.  Gastrointestinal: Positive for abdominal pain, nausea and vomiting. Negative for constipation and diarrhea.  Musculoskeletal: Negative for joint pain.   Exam: Physical Exam  Constitutional: She is oriented to person, place, and time.  HENT:  Nose: No mucosal edema.  Mouth/Throat: No oropharyngeal exudate or posterior oropharyngeal edema.  Eyes: Pupils are equal, round, and reactive to light. Conjunctivae, EOM and lids are normal.  Neck: No JVD present. Carotid bruit is not present. No edema present. No thyroid mass and no thyromegaly present.  Cardiovascular: S1 normal and S2 normal.  Bradycardia present.  Exam reveals no gallop.   Murmur heard.  Systolic murmur is present with a grade of 2/6  Pulses:      Dorsalis pedis pulses are 2+ on the right side, and 2+ on the left side.  Respiratory: No respiratory distress. She has decreased breath sounds in the right lower field and the left lower field. She has no wheezes. She has no rhonchi. She has no rales.  GI: Soft. Bowel sounds are normal. There is no tenderness.  Musculoskeletal:       Right ankle: She exhibits swelling.       Left ankle: She exhibits  swelling.  Lymphadenopathy:    She has no cervical adenopathy.  Neurological: She is alert and oriented to person, place, and time. No cranial nerve deficit.  Skin: Skin is warm. No rash noted. Nails show no clubbing.  Psychiatric: She has a normal mood and affect.      Data Reviewed: Basic Metabolic Panel:  Recent Labs Lab 03/29/17 0355 03/31/17 0448 04/01/17 0532 04/02/17 0420  NA 146* 145 143 143  K 4.3 4.1 4.1 4.0  CL 113* 113* 112* 110  CO2 27 27 28 28   GLUCOSE 163* 109* 98 103*  BUN 12 26* 39* 39*  CREATININE 0.80 0.83 0.95 0.89  CALCIUM 8.4* 9.2 9.2 9.1  MG 1.4*  --  2.3  --   PHOS 2.3*  --  3.0  --    CBC:  Recent Labs Lab 03/29/17 0355 04/01/17 0532 04/02/17 0420 04/03/17 1156  WBC 6.5 7.5 7.6 12.1*  HGB 10.4* 10.4* 10.7* 11.4*  HCT 32.4* 32.6* 33.0* 35.8  MCV 91.0 90.1 89.6 90.0  PLT 80* 77* 79* 89*    CBG:  Recent Labs Lab 04/04/17 0840 04/04/17 0843 04/04/17 1126 04/04/17 1129 04/04/17 1201  GLUCAP 54* 98 26* 59* 106*    Recent Results (from the past 240 hour(s))  MRSA PCR Screening     Status: None   Collection Time: 03/27/17 12:17 PM  Result Value Ref Range Status   MRSA by  PCR NEGATIVE NEGATIVE Final    Comment:        The GeneXpert MRSA Assay (FDA approved for NASAL specimens only), is one component of a comprehensive MRSA colonization surveillance program. It is not intended to diagnose MRSA infection nor to guide or monitor treatment for MRSA infections.   CULTURE, BLOOD (ROUTINE X 2) w Reflex to ID Panel     Status: Abnormal   Collection Time: 03/27/17  1:51 PM  Result Value Ref Range Status   Specimen Description BLOOD RIGHT HAND  Final   Special Requests   Final    BOTTLES DRAWN AEROBIC AND ANAEROBIC Blood Culture adequate volume   Culture  Setup Time   Final    GRAM POSITIVE COCCI AEROBIC BOTTLE ONLY CRITICAL RESULT CALLED TO, READ BACK BY AND VERIFIED WITH: LISA CLUTTS AT 1605 03/28/17. MSS    Culture (A)   Final    STAPHYLOCOCCUS SPECIES (COAGULASE NEGATIVE) THE SIGNIFICANCE OF ISOLATING THIS ORGANISM FROM A SINGLE SET OF BLOOD CULTURES WHEN MULTIPLE SETS ARE DRAWN IS UNCERTAIN. PLEASE NOTIFY THE MICROBIOLOGY DEPARTMENT WITHIN ONE WEEK IF SPECIATION AND SENSITIVITIES ARE REQUIRED. Performed at [facility], [place] [address], [place], [place] [address]    Report Status 03/30/2017 FINAL  Final  Blood Culture ID Panel (Reflexed)     Status: Abnormal   Collection Time: 03/27/17  1:51 PM  Result Value Ref Range Status   Enterococcus species NOT DETECTED NOT DETECTED Final   Listeria monocytogenes NOT DETECTED NOT DETECTED Final   Staphylococcus species DETECTED (A) NOT DETECTED Final    Comment: Methicillin (oxacillin) susceptible coagulase negative staphylococcus. Possible blood culture contaminant (unless isolated from more than one blood culture draw or clinical case suggests pathogenicity). No antibiotic treatment is indicated for blood  culture contaminants. CRITICAL RESULT CALLED TO, READ BACK BY AND VERIFIED WITH: LISA CLUTTS AT 1605 03/28/17. MSS    Staphylococcus aureus NOT DETECTED NOT DETECTED Final   Methicillin resistance NOT DETECTED NOT DETECTED Final   Streptococcus species NOT DETECTED NOT DETECTED Final   Streptococcus agalactiae NOT DETECTED NOT DETECTED Final   Streptococcus pneumoniae NOT DETECTED NOT DETECTED Final   Streptococcus pyogenes NOT DETECTED NOT DETECTED Final   Acinetobacter baumannii NOT DETECTED NOT DETECTED Final   Enterobacteriaceae species NOT DETECTED NOT DETECTED Final   Enterobacter cloacae complex NOT DETECTED NOT DETECTED Final   Escherichia coli NOT DETECTED NOT DETECTED Final   Klebsiella oxytoca NOT DETECTED NOT DETECTED Final   Klebsiella pneumoniae NOT DETECTED NOT DETECTED Final   Proteus species NOT DETECTED NOT DETECTED Final   Serratia marcescens NOT DETECTED NOT DETECTED Final   Haemophilus influenzae NOT DETECTED NOT DETECTED Final    Neisseria meningitidis NOT DETECTED NOT DETECTED Final   Pseudomonas aeruginosa NOT DETECTED NOT DETECTED Final   Candida albicans NOT DETECTED NOT DETECTED Final   Candida glabrata NOT DETECTED NOT DETECTED Final   Candida krusei NOT DETECTED NOT DETECTED Final   Candida parapsilosis NOT DETECTED NOT DETECTED Final   Candida tropicalis NOT DETECTED NOT DETECTED Final  Urine Culture     Status: None   Collection Time: 03/27/17  2:41 PM  Result Value Ref Range Status   Specimen Description URINE, RANDOM  Final   Special Requests NONE  Final   Culture   Final    NO GROWTH Performed at [facility], [address] [place], [place] [address]    Report Status 03/28/2017 FINAL  Final  CULTURE, BLOOD (ROUTINE  X 2) w Reflex to ID Panel     Status: None   Collection Time: 03/27/17  3:10 PM  Result Value Ref Range Status   Specimen Description BLOOD BLOOD RIGHT HAND  Final   Special Requests   Final    BOTTLES DRAWN AEROBIC AND ANAEROBIC Blood Culture adequate volume   Culture NO GROWTH 5 DAYS  Final   Report Status 04/01/2017 FINAL  Final     Studies: No results found.  Scheduled Meds: . chlorhexidine gluconate (MEDLINE KIT)  15 mL Mouth Rinse BID  . enoxaparin (LOVENOX) injection  40 mg Subcutaneous Q12H  . famotidine  20 mg Oral BID  . hydrocortisone sod succinate (SOLU-CORTEF) inj  50 mg Intravenous Daily  . mouth rinse  15 mL Mouth Rinse QID  . multivitamin with minerals  1 tablet Oral Daily  . protein supplement shake  11 oz Oral BID BM   Continuous Infusions: . dextrose 25 mL/hr at 04/04/17 1201    Assessment/Plan:  1. Bradycardia with good blood pressure. Seen by cardiology.  Heart rate does come up with exercise.  Cardiology does not want to do anything further. 2. Clinical sepsis ruled out.  Patient was on empiric antibiotics and this was discontinued.  Hypothermia has improved. 3. Hypoglycemia which is now recurrence.  Patient had nausea vomiting and was taken  off the steroids.  Since the patient did have nausea vomiting I will put back on D10 drip and check fingersticks every 4 hours.  May need a slower taper of the steroids.  Wondering if she is adrenal insufficient but the cosyntropin test actually showed high levels but patient was on steroids prior to this. 4. Nausea vomiting.  As needed Zofran ordered.  Increase Pepcid to twice daily.  Check a lipase tomorrow morning 5. Acute encephalopathy this has improved from being unresponsive on presentation. 6. Schizoaffective disorder.  Seen by psychiatry and holding off on psychiatry medications at this point 7. History of sleep apnea 8. History of GERD 9. Thrombocytopenia.  Send hepatitis C profile 9.   Physical therapy eval  Code Status:     Code Status Orders        Start     Ordered   03/27/17 1700  Full code  Continuous     03/27/17 1659    Code Status History    Date Active Date Inactive Code Status Order ID Comments User Context   01/24/2017  5:35 PM 01/28/2017  8:51 PM Full Code [id number]  Julianne Rice, MD ED   01/10/2017 10:02 PM 01/11/2017  6:32 PM Full Code [id number]  Daleen Bo, MD ED   12/26/2016  5:18 PM 12/26/2016 11:20 PM Full Code [id number]  Julianne Rice, MD ED   12/13/2016  3:16 PM 12/14/2016 11:02 PM Full Code [id number]  Noemi Chapel, MD ED    Advance Directive Documentation     Most Recent Value  Type of Advance Directive  Living will  Pre-existing out of facility DNR order (yellow form or pink MOST form)  -  "MOST" Form in Place?  -     Family Communication: As per critical care specialist Disposition Plan: Will be transferred out of the ICU today  Consultants:  Critical care team  Cardiology  Time spent: 25 minutes  [place], [place]

## 2017-04-04 NOTE — Progress Notes (Signed)
Report given to [facility], RN on 1C. Patient to transfer shortly with cardiac monitoring. Dr. Sung AmabileSimonds aware of low CBGs, stated to keep CBG checks AC/HS since patient is asymptomatic. Last CBG 98. Trudee KusterBrandi R [place]

## 2017-04-04 NOTE — Progress Notes (Signed)
Patient complaining of indigestion with mid sternal "burning". Patient given maalox and pepcid restarted. Patient then had episode of nausea and vomiting. Dr. Kirke CorinArida notified of changes. No changes in HR (still brady in 40's and 50's) or BP. Patient is alert and oriented currently. CBG low at afternoon check. Juice given and will recheck. Dr. Renae GlossWieting notified while on unit, orders placed. Trudee KusterBrandi R [place]

## 2017-04-04 NOTE — Progress Notes (Signed)
When helping to clean patient after an incontinent episode, it was noted that a strong foul odor was present in the vaginal area both before and after pericare and general cleaning.  Will continue to monitor.

## 2017-04-04 NOTE — Progress Notes (Signed)
Low CBG, patient alert and oriented. Juice given, recheck still low. Magda PaganiniBincy V, NP notified. Order for 1 amp of D50 ordered. D50 given, and patient eating breakfast. Will recheck CBG.  Trudee KusterBrandi R [place]

## 2017-04-05 ENCOUNTER — Inpatient Hospital Stay: Payer: Medicaid Other

## 2017-04-05 LAB — COMPREHENSIVE METABOLIC PANEL WITH GFR
ALT: 100 U/L — ABNORMAL HIGH (ref 14–54)
AST: 29 U/L (ref 15–41)
Albumin: 2.6 g/dL — ABNORMAL LOW (ref 3.5–5.0)
Alkaline Phosphatase: 70 U/L (ref 38–126)
Anion gap: 6 (ref 5–15)
BUN: 20 mg/dL (ref 6–20)
CO2: 33 mmol/L — ABNORMAL HIGH (ref 22–32)
Calcium: 8.7 mg/dL — ABNORMAL LOW (ref 8.9–10.3)
Chloride: 102 mmol/L (ref 101–111)
Creatinine, Ser: 0.51 mg/dL (ref 0.44–1.00)
GFR calc Af Amer: >60 mL/min
GFR calc non Af Amer: >60 mL/min
Glucose, Bld: 69 mg/dL (ref 65–99)
Potassium: 3.5 mmol/L (ref 3.5–5.1)
Sodium: 141 mmol/L (ref 135–145)
Total Bilirubin: 0.6 mg/dL (ref 0.3–1.2)
Total Protein: 6 g/dL — ABNORMAL LOW (ref 6.5–8.1)

## 2017-04-05 LAB — GLUCOSE, RANDOM
Glucose, Bld: 69 mg/dL (ref 65–99)
Glucose, Bld: 104 mg/dL — ABNORMAL HIGH (ref 65–99)

## 2017-04-05 LAB — GLUCOSE, CAPILLARY
GLUCOSE-CAPILLARY: 57 mg/dL — AB (ref 65–99)
GLUCOSE-CAPILLARY: 62 mg/dL — AB (ref 65–99)
Glucose-Capillary: 26 mg/dL — CL (ref 65–99)
Glucose-Capillary: 59 mg/dL — ABNORMAL LOW (ref 65–99)
Glucose-Capillary: 49 mg/dL — ABNORMAL LOW (ref 65–99)
Glucose-Capillary: 54 mg/dL — ABNORMAL LOW (ref 65–99)

## 2017-04-05 LAB — LIPASE, BLOOD: Lipase: 25 U/L (ref 11–51)

## 2017-04-05 MED ORDER — DEXTROSE 50 % IV SOLN
INTRAVENOUS | Status: AC
  Filled 2017-04-05: qty 50

## 2017-04-05 MED ORDER — DEXTROSE 50 % IV SOLN
25.0000 g | Freq: Once | INTRAVENOUS | Status: AC
Start: 2017-04-05 — End: 2017-04-05
  Administered 2017-04-05: 08:00:00 25 g via INTRAVENOUS

## 2017-04-05 NOTE — Progress Notes (Signed)
bipap refused 

## 2017-04-05 NOTE — Progress Notes (Signed)
Patient ID: Sarah Hurst, female   DOB: 1960/12/30, 56 y.o.   MRN: [id number]  [facility] PROGRESS NOTE  Eve Rey MOQ:[id number] DOB: 1960-06-25 DOA: 03/24/2017 PCP: System, Pcp Not In  HPI/Subjective:  Still complains of some nausea. Blood sugar is found to be 29 to the morning.  On D10 at 25 mL/h  Objective: Vitals:   04/04/17 2028 04/05/17 0530  BP: 114/61 118/72  Pulse: (!) 48 (!) 48  Resp:  14  Temp: (!) 97.3 F (36.3 C) (!) 97.4 F (36.3 C)  SpO2: 100% 100%    Filed Weights   03/24/17 1210 03/31/17 1051  Weight: (!) 143.8 kg (317 lb) (!) 152.4 kg (336 lb)    ROS: Review of Systems  Unable to perform ROS: Mental acuity  Respiratory: Positive for shortness of breath.   Cardiovascular: Positive for chest pain.  Gastrointestinal: Positive for abdominal pain, nausea and vomiting. Negative for constipation and diarrhea.  Musculoskeletal: Negative for joint pain.   Exam: Physical Exam  Constitutional: She is oriented to person, place, and time.  HENT:  Nose: No mucosal edema.  Mouth/Throat: No oropharyngeal exudate or posterior oropharyngeal edema.  Eyes: Pupils are equal, round, and reactive to light. Conjunctivae, EOM and lids are normal.  Neck: No JVD present. Carotid bruit is not present. No edema present. No thyroid mass and no thyromegaly present.  Cardiovascular: S1 normal and S2 normal.  Bradycardia present.  Exam reveals no gallop.   Murmur heard.  Systolic murmur is present with a grade of 2/6  Pulses:      Dorsalis pedis pulses are 2+ on the right side, and 2+ on the left side.  Respiratory: No respiratory distress. She has no wheezes. She has no rhonchi. She has no rales.  GI: Soft. Bowel sounds are normal. There is no tenderness.  Musculoskeletal:       Right ankle: She exhibits swelling.       Left ankle: She exhibits swelling.  Lymphadenopathy:    She has no cervical adenopathy.  Neurological: She is alert and oriented to person,  place, and time. No cranial nerve deficit.  Skin: Skin is warm. No rash noted. Nails show no clubbing.  Psychiatric: She has a normal mood and affect.      Data Reviewed: Basic Metabolic Panel:  Recent Labs Lab 03/31/17 0448 04/01/17 0532 04/02/17 0420 04/05/17 0801  NA 145 143 143 141  K 4.1 4.1 4.0 3.5  CL 113* 112* 110 102  CO2 27 28 28  33*  GLUCOSE 109* 98 103* 69  69  BUN 26* 39* 39* 20  CREATININE 0.83 0.95 0.89 0.51  CALCIUM 9.2 9.2 9.1 8.7*  MG  --  2.3  --   --   PHOS  --  3.0  --   --    CBC:  Recent Labs Lab 04/01/17 0532 04/02/17 0420 04/03/17 1156  WBC 7.5 7.6 12.1*  HGB 10.4* 10.7* 11.4*  HCT 32.6* 33.0* 35.8  MCV 90.1 89.6 90.0  PLT 77* 79* 89*    CBG:  Recent Labs Lab 04/04/17 2104 04/05/17 0744 04/05/17 0749 04/05/17 0854 04/05/17 0943  GLUCAP 76 27* 26* 59* 49*    Recent Results (from the past 240 hour(s))  MRSA PCR Screening     Status: None   Collection Time: 03/27/17 12:17 PM  Result Value Ref Range Status   MRSA by PCR NEGATIVE NEGATIVE Final    Comment:        The GeneXpert MRSA Assay (  FDA approved for NASAL specimens only), is one component of a comprehensive MRSA colonization surveillance program. It is not intended to diagnose MRSA infection nor to guide or monitor treatment for MRSA infections.   CULTURE, BLOOD (ROUTINE X 2) w Reflex to ID Panel     Status: Abnormal   Collection Time: 03/27/17  1:51 PM  Result Value Ref Range Status   Specimen Description BLOOD RIGHT HAND  Final   Special Requests   Final    BOTTLES DRAWN AEROBIC AND ANAEROBIC Blood Culture adequate volume   Culture  Setup Time   Final    GRAM POSITIVE COCCI AEROBIC BOTTLE ONLY CRITICAL RESULT CALLED TO, READ BACK BY AND VERIFIED WITH: LISA CLUTTS AT 1605 03/28/17. MSS    Culture (A)  Final    STAPHYLOCOCCUS SPECIES (COAGULASE NEGATIVE) THE SIGNIFICANCE OF ISOLATING THIS ORGANISM FROM A SINGLE SET OF BLOOD CULTURES WHEN MULTIPLE SETS ARE  DRAWN IS UNCERTAIN. PLEASE NOTIFY THE MICROBIOLOGY DEPARTMENT WITHIN ONE WEEK IF SPECIATION AND SENSITIVITIES ARE REQUIRED. Performed at [facility], [place] [address], [place], [place] [address]    Report Status 03/30/2017 FINAL  Final  Blood Culture ID Panel (Reflexed)     Status: Abnormal   Collection Time: 03/27/17  1:51 PM  Result Value Ref Range Status   Enterococcus species NOT DETECTED NOT DETECTED Final   Listeria monocytogenes NOT DETECTED NOT DETECTED Final   Staphylococcus species DETECTED (A) NOT DETECTED Final    Comment: Methicillin (oxacillin) susceptible coagulase negative staphylococcus. Possible blood culture contaminant (unless isolated from more than one blood culture draw or clinical case suggests pathogenicity). No antibiotic treatment is indicated for blood  culture contaminants. CRITICAL RESULT CALLED TO, READ BACK BY AND VERIFIED WITH: LISA CLUTTS AT 1605 03/28/17. MSS    Staphylococcus aureus NOT DETECTED NOT DETECTED Final   Methicillin resistance NOT DETECTED NOT DETECTED Final   Streptococcus species NOT DETECTED NOT DETECTED Final   Streptococcus agalactiae NOT DETECTED NOT DETECTED Final   Streptococcus pneumoniae NOT DETECTED NOT DETECTED Final   Streptococcus pyogenes NOT DETECTED NOT DETECTED Final   Acinetobacter baumannii NOT DETECTED NOT DETECTED Final   Enterobacteriaceae species NOT DETECTED NOT DETECTED Final   Enterobacter cloacae complex NOT DETECTED NOT DETECTED Final   Escherichia coli NOT DETECTED NOT DETECTED Final   Klebsiella oxytoca NOT DETECTED NOT DETECTED Final   Klebsiella pneumoniae NOT DETECTED NOT DETECTED Final   Proteus species NOT DETECTED NOT DETECTED Final   Serratia marcescens NOT DETECTED NOT DETECTED Final   Haemophilus influenzae NOT DETECTED NOT DETECTED Final   Neisseria meningitidis NOT DETECTED NOT DETECTED Final   Pseudomonas aeruginosa NOT DETECTED NOT DETECTED Final   Candida albicans NOT DETECTED NOT  DETECTED Final   Candida glabrata NOT DETECTED NOT DETECTED Final   Candida krusei NOT DETECTED NOT DETECTED Final   Candida parapsilosis NOT DETECTED NOT DETECTED Final   Candida tropicalis NOT DETECTED NOT DETECTED Final  Urine Culture     Status: None   Collection Time: 03/27/17  2:41 PM  Result Value Ref Range Status   Specimen Description URINE, RANDOM  Final   Special Requests NONE  Final   Culture   Final    NO GROWTH Performed at [facility], [address] [place], [place] [address]    Report Status 03/28/2017 FINAL  Final  CULTURE, BLOOD (ROUTINE X 2) w Reflex to ID Panel     Status: None   Collection Time: 03/27/17  3:10 PM  Result Value Ref Range Status   Specimen Description BLOOD BLOOD RIGHT HAND  Final   Special Requests   Final    BOTTLES DRAWN AEROBIC AND ANAEROBIC Blood Culture adequate volume   Culture NO GROWTH 5 DAYS  Final   Report Status 04/01/2017 FINAL  Final     Studies: Dg Chest Port 1 View  Result Date: 04/05/2017 CLINICAL DATA:  Respiratory failure EXAM: PORTABLE CHEST 1 VIEW COMPARISON:  March 27, 2017 FINDINGS: No pneumothorax. A right IJ has been pulled back several cm in the interval, now terminating in the region of the right brachiocephalic vein, 10 cm above the caval atrial junction. The cardiomediastinal silhouette is stable. No other interval change. IMPRESSION: 1. The right IJ has been pulled back terminating 10 cm above the caval atrial junction. No pneumothorax. 2. Mild interstitial prominence with no overt edema. No focal infiltrate. No other change. Electronically Signed   By: Dorise Bullion III M.D   On: 04/05/2017 07:59    Scheduled Meds: . dextrose      . chlorhexidine gluconate (MEDLINE KIT)  15 mL Mouth Rinse BID  . enoxaparin (LOVENOX) injection  40 mg Subcutaneous Q12H  . famotidine  20 mg Oral BID  . mouth rinse  15 mL Mouth Rinse QID  . multivitamin with minerals  1 tablet Oral Daily  . protein supplement shake  11  oz Oral BID BM   Continuous Infusions:   Assessment/Plan:     1. Hypoglycemia.  Etiology unclear. We drew stat blood to send to the lab.  Blood glucose significantly higher than on bedside glucometer reading.  Patient was given D50 and while waiting for results. With blood sugars higher on lab work.  D10 2. Bradycardia with good blood pressure. Seen by cardiology.  Heart rate does come up with exercise.  No further workup planned by cardiology 3. Clinical sepsis ruled out.  Patient was on empiric antibiotics and this was discontinued.  Hypothermia has improved. 4. Nausea vomiting.  As needed Zofran ordered.  Increase Pepcid to twice daily.   5. Acute encephalopathy this has improved from being unresponsive on presentation. 6. Schizoaffective disorder.  Seen by psychiatry and holding off on psychiatry medications at this point 7. Thrombocytopenia.  Stable.  No bleeding.  Code Status:     Code Status Orders        Start     Ordered   03/27/17 1700  Full code  Continuous     03/27/17 1659    Code Status History    Date Active Date Inactive Code Status Order ID Comments User Context   01/24/2017  5:35 PM 01/28/2017  8:51 PM Full Code [id number]  Julianne Rice, MD ED   01/10/2017 10:02 PM 01/11/2017  6:32 PM Full Code [id number]  Daleen Bo, MD ED   12/26/2016  5:18 PM 12/26/2016 11:20 PM Full Code [id number]  Julianne Rice, MD ED   12/13/2016  3:16 PM 12/14/2016 11:02 PM Full Code [id number]  Noemi Chapel, MD ED    Advance Directive Documentation     Most Recent Value  Type of Advance Directive  Living will  Pre-existing out of facility DNR order (yellow form or pink MOST form)  -  "MOST" Form in Place?  -     Family Communication: As per critical care specialist Disposition Plan: Will be transferred out of the ICU today  Consultants:  Critical care team  Cardiology  Critical care Time spent: 35 minutes  Mariaha Ell[place],  [place]

## 2017-04-05 NOTE — Progress Notes (Signed)
Report from off going nurse stated that MD stated to cancel fsbs due to pt not a diabetic and non symptomatic with false readings on glucometer. Noted not to have order taken out of computer

## 2017-04-06 ENCOUNTER — Other Ambulatory Visit: Payer: Self-pay

## 2017-04-06 DIAGNOSIS — R001 Bradycardia, unspecified: Secondary | ICD-10-CM

## 2017-04-06 LAB — HEPATITIS C ANTIBODY

## 2017-04-06 MED ORDER — SODIUM CHLORIDE 0.9% FLUSH
10.0000 mL | Freq: Two times a day (BID) | INTRAVENOUS | Status: DC
  Administered 2017-04-06 – 2017-04-07 (×3): 10 mL via INTRAVENOUS

## 2017-04-06 NOTE — Progress Notes (Signed)
Patient ID: Sarah Hurst, female   DOB: 27-Aug-1960, 56 y.o.   MRN: [id number]  [facility] PROGRESS NOTE  Sarah Hurst KZL:[id number] DOB: 1961-02-06 DOA: 03/24/2017 PCP: Sarah Hurst, Pcp Not In  HPI/Subjective:  Patient feels well other than generalized weakness. Sinus bradycardia in the high 40s and 50s on telemetry monitoring.  Objective: Vitals:   04/05/17 2010 04/06/17 0450  BP: 122/68 111/65  Pulse: (!) 58 (!) 59  Resp: 14 14  Temp: (!) 97.5 F (36.4 C) (!) 97.5 F (36.4 C)  SpO2: 100% 100%    Filed Weights   03/24/17 1210 03/31/17 1051  Weight: (!) 143.8 kg (317 lb) (!) 152.4 kg (336 lb)    ROS: Review of Systems  Unable to perform ROS: Mental acuity  Respiratory: Positive for shortness of breath.   Cardiovascular: Positive for chest pain.  Gastrointestinal: Positive for abdominal pain, nausea and vomiting. Negative for constipation and diarrhea.  Musculoskeletal: Negative for joint pain.   Exam: Physical Exam  Constitutional: She is oriented to person, place, and time.  HENT:  Nose: No mucosal edema.  Mouth/Throat: No oropharyngeal exudate or posterior oropharyngeal edema.  Eyes: Conjunctivae, EOM and lids are normal. Pupils are equal, round, and reactive to light.  Neck: No JVD present. Carotid bruit is not present. No edema present. No thyroid mass and no thyromegaly present.  Cardiovascular: S1 normal and S2 normal. Bradycardia present. Exam reveals no gallop.  Murmur heard.  Systolic murmur is present with a grade of 2/6. Pulses:      Dorsalis pedis pulses are 2+ on the right side, and 2+ on the left side.  Respiratory: No respiratory distress. She has no wheezes. She has no rhonchi. She has no rales.  GI: Soft. Bowel sounds are normal. There is no tenderness.  Musculoskeletal:       Right ankle: She exhibits swelling.       Left ankle: She exhibits swelling.  Lymphadenopathy:    She has no cervical adenopathy.  Neurological: She is alert and  oriented to person, place, and time. No cranial nerve deficit.  Skin: Skin is warm. No rash noted. Nails show no clubbing.  Psychiatric: She has a normal mood and affect.      Data Reviewed: Basic Metabolic Panel: Recent Labs  Lab 03/31/17 0448 04/01/17 0532 04/02/17 0420 04/05/17 0801 04/05/17 1231  NA 145 143 143 141  --   K 4.1 4.1 4.0 3.5  --   CL 113* 112* 110 102  --   CO2 _0 33*  --   GLUCOSE 109* 98 103* 69  69 104*  BUN 26* 39* 39* 20  --   CREATININE 0.83 0.95 0.89 0.51  --   CALCIUM 9.2 9.2 9.1 8.7*  --   MG  --  2.3  --   --   --   PHOS  --  3.0  --   --   --    CBC: Recent Labs  Lab 04/01/17 0532 04/02/17 0420 04/03/17 1156  WBC 7.5 7.6 12.1*  HGB 10.4* 10.7* 11.4*  HCT 32.6* 33.0* 35.8  MCV 90.1 89.6 90.0  PLT 77* 79* 89*    CBG: Recent Labs  Lab 04/05/17 0854 04/05/17 0943 04/05/17 1144 04/05/17 1734 04/05/17 2104  GLUCAP 59* 49* 54* 57* 62*    Recent Results (from the past 240 hour(s))  CULTURE, BLOOD (ROUTINE X 2) w Reflex to ID Panel     Status: Abnormal   Collection Time: 03/27/17  1:51 PM  Result Value Ref Range Status   Specimen Description BLOOD RIGHT HAND  Final   Special Requests   Final    BOTTLES DRAWN AEROBIC AND ANAEROBIC Blood Culture adequate volume   Culture  Setup Time   Final    GRAM POSITIVE COCCI AEROBIC BOTTLE ONLY CRITICAL RESULT CALLED TO, READ BACK BY AND VERIFIED WITH: LISA CLUTTS AT 1605 03/28/17. MSS    Culture (A)  Final    STAPHYLOCOCCUS SPECIES (COAGULASE NEGATIVE) THE SIGNIFICANCE OF ISOLATING THIS ORGANISM FROM A SINGLE SET OF BLOOD CULTURES WHEN MULTIPLE SETS ARE DRAWN IS UNCERTAIN. PLEASE NOTIFY THE MICROBIOLOGY DEPARTMENT WITHIN ONE WEEK IF SPECIATION AND SENSITIVITIES ARE REQUIRED. Performed at [facility], [place] [address] [place], [place] [address]    Report Status 03/30/2017 FINAL  Final  Blood Culture ID Panel (Reflexed)     Status: Abnormal   Collection Time: 03/27/17  1:51 PM   Result Value Ref Range Status   Enterococcus species NOT DETECTED NOT DETECTED Final   Listeria monocytogenes NOT DETECTED NOT DETECTED Final   Staphylococcus species DETECTED (A) NOT DETECTED Final    Comment: Methicillin (oxacillin) susceptible coagulase negative staphylococcus. Possible blood culture contaminant (unless isolated from more than one blood culture draw or clinical case suggests pathogenicity). No antibiotic treatment is indicated for blood  culture contaminants. CRITICAL RESULT CALLED TO, READ BACK BY AND VERIFIED WITH: LISA CLUTTS AT 1605 03/28/17. MSS    Staphylococcus aureus NOT DETECTED NOT DETECTED Final   Methicillin resistance NOT DETECTED NOT DETECTED Final   Streptococcus species NOT DETECTED NOT DETECTED Final   Streptococcus agalactiae NOT DETECTED NOT DETECTED Final   Streptococcus pneumoniae NOT DETECTED NOT DETECTED Final   Streptococcus pyogenes NOT DETECTED NOT DETECTED Final   Acinetobacter baumannii NOT DETECTED NOT DETECTED Final   Enterobacteriaceae species NOT DETECTED NOT DETECTED Final   Enterobacter cloacae complex NOT DETECTED NOT DETECTED Final   Escherichia coli NOT DETECTED NOT DETECTED Final   Klebsiella oxytoca NOT DETECTED NOT DETECTED Final   Klebsiella pneumoniae NOT DETECTED NOT DETECTED Final   Proteus species NOT DETECTED NOT DETECTED Final   Serratia marcescens NOT DETECTED NOT DETECTED Final   Haemophilus influenzae NOT DETECTED NOT DETECTED Final   Neisseria meningitidis NOT DETECTED NOT DETECTED Final   Pseudomonas aeruginosa NOT DETECTED NOT DETECTED Final   Candida albicans NOT DETECTED NOT DETECTED Final   Candida glabrata NOT DETECTED NOT DETECTED Final   Candida krusei NOT DETECTED NOT DETECTED Final   Candida parapsilosis NOT DETECTED NOT DETECTED Final   Candida tropicalis NOT DETECTED NOT DETECTED Final  Urine Culture     Status: None   Collection Time: 03/27/17  2:41 PM  Result Value Ref Range Status   Specimen  Description URINE, RANDOM  Final   Special Requests NONE  Final   Culture   Final    NO GROWTH Performed at [facility], [address] [place], [place] [address]    Report Status 03/28/2017 FINAL  Final  CULTURE, BLOOD (ROUTINE X 2) w Reflex to ID Panel     Status: None   Collection Time: 03/27/17  3:10 PM  Result Value Ref Range Status   Specimen Description BLOOD BLOOD RIGHT HAND  Final   Special Requests   Final    BOTTLES DRAWN AEROBIC AND ANAEROBIC Blood Culture adequate volume   Culture NO GROWTH 5 DAYS  Final   Report Status 04/01/2017 FINAL  Final  Studies: Dg Chest Port 1 View  Result Date: 04/05/2017 CLINICAL DATA:  Respiratory failure EXAM: PORTABLE CHEST 1 VIEW COMPARISON:  March 27, 2017 FINDINGS: No pneumothorax. A right IJ has been pulled back several cm in the interval, now terminating in the region of the right brachiocephalic vein, 10 cm above the caval atrial junction. The cardiomediastinal silhouette is stable. No other interval change. IMPRESSION: 1. The right IJ has been pulled back terminating 10 cm above the caval atrial junction. No pneumothorax. 2. Mild interstitial prominence with no overt edema. No focal infiltrate. No other change. Electronically Signed   By: Dorise Bullion III M.D   On: 04/05/2017 07:59    Scheduled Meds: . chlorhexidine gluconate (MEDLINE KIT)  15 mL Mouth Rinse BID  . enoxaparin (LOVENOX) injection  40 mg Subcutaneous Q12H  . famotidine  20 mg Oral BID  . mouth rinse  15 mL Mouth Rinse QID  . multivitamin with minerals  1 tablet Oral Daily  . protein supplement shake  11 oz Oral BID BM   Continuous Infusions:   Assessment/Plan:     * Hypoglycemia. Was error on glucometer.  Blood glucose on lab studies was normal.  Stopped Accu-Cheks.  *Bradycardia with good blood pressure. Seen by cardiology.  Heart rate does come up with exercise.  No further workup planned by cardiology  * Clinical sepsis ruled out.  Patient  was on empiric antibiotics and this was discontinued.  Hypothermia has improved.  * Nausea vomiting.  As needed Zofran ordered.  Increase Pepcid to twice daily. Resolved Lipase normal  * Acute encephalopathy this has improved from being unresponsive on presentation.  * Schizoaffective disorder.  Seen by psychiatry  * Thrombocytopenia.  Stable.  No bleeding.  Code Status:     Code Status Orders        Start     Ordered   03/27/17 1700  Full code  Continuous     03/27/17 1659    Code Status History    Date Active Date Inactive Code Status Order ID Comments User Context   01/24/2017  5:35 PM 01/28/2017  8:51 PM Full Code [id number]  Julianne Rice, MD ED   01/10/2017 10:02 PM 01/11/2017  6:32 PM Full Code [id number]  Daleen Bo, MD ED   12/26/2016  5:18 PM 12/26/2016 11:20 PM Full Code [id number]  Julianne Rice, MD ED   12/13/2016  3:16 PM 12/14/2016 11:02 PM Full Code [id number]  Noemi Chapel, MD ED    Advance Directive Documentation     Most Recent Value  Type of Advance Directive  Living will  Pre-existing out of facility DNR order (yellow form or pink MOST form)  -  "MOST" Form in Place?  -     Family Communication: As per critical care specialist Disposition Plan: Will be transferred out of the ICU today  Consultants:  Critical care team  Cardiology   Time spent: 35 minutes  [place], [employer]

## 2017-04-07 LAB — GLUCOSE, CAPILLARY
GLUCOSE-CAPILLARY: 31 mg/dL — AB (ref 65–99)
GLUCOSE-CAPILLARY: 27 mg/dL — AB (ref 65–99)
Glucose-Capillary: 26 mg/dL — CL (ref 65–99)

## 2017-04-07 LAB — BASIC METABOLIC PANEL
ANION GAP: 6 (ref 5–15)
BUN: 22 mg/dL — ABNORMAL HIGH (ref 6–20)
CALCIUM: 8.3 mg/dL — AB (ref 8.9–10.3)
CO2: 32 mmol/L (ref 22–32)
CREATININE: 0.68 mg/dL (ref 0.44–1.00)
Chloride: 104 mmol/L (ref 101–111)
GFR calc Af Amer: >60 mL/min (ref >60)
GFR calc non Af Amer: >60 mL/min (ref >60)
GLUCOSE: 74 mg/dL (ref 65–99)
Potassium: 3.8 mmol/L (ref 3.5–5.1)
Sodium: 142 mmol/L (ref 135–145)

## 2017-04-07 LAB — INSULIN AND C-PEPTIDE, SERUM
C PEPTIDE: 1.7 ng/mL (ref 1.1–4.4)
INSULIN: 3.6 u[IU]/mL (ref 2.6–24.9)

## 2017-04-07 NOTE — Progress Notes (Signed)
Pt. Refuses CPAP at this time.

## 2017-04-07 NOTE — Consult Note (Signed)
Sarah Psychiatry Consult   Reason for Consult: Follow-up consult 56 year old woman with schizoaffective disorder Referring Physician: Posey Pronto Patient Identification: Sarah Hurst MRN:  [id number] Principal Diagnosis: Schizoaffective disorder, bipolar type [facility]) Diagnosis:   Patient Active Problem List   Diagnosis Date Noted  . Sinus bradycardia [R00.1]   . Hypercarbia [R06.89]   . Hypoglycemia [E16.2] 03/27/2017  . Schizoaffective disorder, bipolar type ([place]) [F25.0] 03/24/2017    Total Time spent with patient: 20 minutes  Subjective:   Sarah Hurst is a 56 y.o. female patient admitted with "I guess I am feeling okay".  HPI: Follow-up for this 56 year old woman with schizoaffective disorder.  She is recovering on the medical service from her stay in the intensive care unit.  She has no new physical complaints today.  She says her mood is feeling fairly good.  She is a little sluggish and tired but wakes up easily and has a conversation.  No frankly bizarre thinking.  Denies any hallucinations.  Eating okay.  No history of substance abuse.  Past Psychiatric History: Patient has a history of schizoaffective disorder and is maintained on long-acting injectable medicine.  We have been minimizing antipsychotic medicine as she has recovered from her spell of bradycardia and shocklike syndrome.  No known history of actual suicide or violence  Risk to Self: Is patient at risk for suicide?: No Risk to Others:   Prior Inpatient Therapy:   Prior Outpatient Therapy:    Past Medical History:  Past Medical History:  Diagnosis Date  . Asthma   . GERD (gastroesophageal reflux disease)   . Mental disability   . Schizoaffective disorder, bipolar type ([place])   . Sleep apnea     Past Surgical History:  Procedure Laterality Date  . WRIST SURGERY     Family History: History reviewed. No pertinent family history. Family Psychiatric  History: Unknown Social History:  Social History    Substance and Sexual Activity  Alcohol Use Yes   Comment: 1 cup of wine occasionally     Social History   Substance and Sexual Activity  Drug Use No    Social History   Socioeconomic History  . Marital status: Single    Spouse name: None  . Number of children: None  . Years of education: None  . Highest education level: None  Social Needs  . Financial resource strain: None  . Food insecurity - worry: None  . Food insecurity - inability: None  . Transportation needs - medical: None  . Transportation needs - non-medical: None  Occupational History  . None  Tobacco Use  . Smoking status: Former Research scientist (life sciences)  . Smokeless tobacco: Never Used  Substance and Sexual Activity  . Alcohol use: Yes    Comment: 1 cup of wine occasionally  . Drug use: No  . Sexual activity: None  Other Topics Concern  . None  Social History Narrative  . None   Additional Social History:    Allergies:  No Known Allergies  Labs:  Results for orders placed or performed during the hospital encounter of 03/24/17 (from the past 48 hour(s))  Glucose, capillary     Status: Abnormal   Collection Time: 04/05/17  9:04 PM  Result Value Ref Range   Glucose-Capillary 62 (L) 65 - 99 mg/dL  Basic metabolic panel     Status: Abnormal   Collection Time: 04/07/17  4:20 AM  Result Value Ref Range   Sodium 142 135 - 145 mmol/L   Potassium 3.8 3.5 -  5.1 mmol/L   Chloride 104 101 - 111 mmol/L   CO2 32 22 - 32 mmol/L   Glucose, Bld 74 65 - 99 mg/dL   BUN 22 (H) 6 - 20 mg/dL   Creatinine, Ser 0.68 0.44 - 1.00 mg/dL   Calcium 8.3 (L) 8.9 - 10.3 mg/dL   GFR calc non Af Amer >60 >60 mL/min   GFR calc Af Amer >60 >60 mL/min    Comment: (NOTE) The eGFR has been calculated using the CKD EPI equation. This calculation has not been validated in all clinical situations. eGFR's persistently <60 mL/min signify possible Chronic Kidney Disease.    Anion gap 6 5 - 15    Current Facility-Administered Medications   Medication Dose Route Frequency Provider Last Rate Last Dose  . acetaminophen (TYLENOL) tablet 650 mg  650 mg Oral Q6H PRN Gouru, Aruna, MD   650 mg at 03/31/17 1019   Or  . acetaminophen (TYLENOL) suppository 650 mg  650 mg Rectal Q6H PRN Gouru, Aruna, MD      . alum & mag hydroxide-simeth (MAALOX/MYLANTA) 200-200-20 MG/5ML suspension 30 mL  30 mL Oral Q4H PRN Varughese, Bincy S, NP   30 mL at 04/04/17 0755  . chlorhexidine gluconate (MEDLINE KIT) (PERIDEX) 0.12 % solution 15 mL  15 mL Mouth Rinse BID Awilda Bill, NP   15 mL at 04/05/17 2303  . enoxaparin (LOVENOX) injection 40 mg  40 mg Subcutaneous Q12H Lenis Noon, RPH   40 mg at 04/07/17 1017  . famotidine (PEPCID) tablet 20 mg  20 mg Oral BID Loletha Grayer, MD   20 mg at 04/07/17 1018  . iopamidol (ISOVUE-M) 61 % intrathecal injection 15 mL  15 mL Intrathecal Once PRN Max Sane, MD      . MEDLINE mouth rinse  15 mL Mouth Rinse QID Awilda Bill, NP   15 mL at 04/06/17 1831  . multivitamin with minerals tablet 1 tablet  1 tablet Oral Daily Max Sane, MD   1 tablet at 04/07/17 1018  . ondansetron (ZOFRAN) injection 4 mg  4 mg Intravenous Q6H PRN Wieting, Richard, MD      . protein supplement (PREMIER PROTEIN) liquid  11 oz Oral BID BM Wilhelmina Mcardle, MD   11 oz at 04/07/17 1454  . sodium chloride flush (NS) 0.9 % injection 10 mL  10 mL Intravenous Q12H Sudini, Srikar, MD   10 mL at 04/07/17 1018  . sodium chloride flush (NS) 0.9 % injection 10-40 mL  10-40 mL Intracatheter PRN Max Sane, MD   30 mL at 04/01/17 2123    Musculoskeletal: Strength & Muscle Tone: within normal limits Gait & Station: normal Patient leans: N/A  Psychiatric Specialty Exam: Physical Exam  Nursing note and vitals reviewed. Constitutional: She appears well-developed and well-nourished.  HENT:  Head: Normocephalic and atraumatic.  Eyes: Conjunctivae are normal. Pupils are equal, round, and reactive to light.  Neck: Normal range of motion.   Cardiovascular: Regular rhythm and normal heart sounds.  Respiratory: Effort normal. No respiratory distress.  GI: Soft.  Musculoskeletal: Normal range of motion.  Neurological: She is alert.  Skin: Skin is warm and dry.  Psychiatric: Judgment normal. Her affect is blunt. Her speech is delayed. She is slowed. Thought content is not paranoid. She expresses no homicidal and no suicidal ideation. She exhibits abnormal recent memory.    Review of Systems  Constitutional: Negative.   HENT: Negative.   Eyes: Negative.   Respiratory: Negative.  Cardiovascular: Negative.   Gastrointestinal: Negative.   Musculoskeletal: Negative.   Skin: Negative.   Neurological: Negative.   Psychiatric/Behavioral: Negative for depression, hallucinations, memory loss, substance abuse and suicidal ideas. The patient is not nervous/anxious and does not have insomnia.     Blood pressure 122/68, pulse 66, temperature (!) 97.3 F (36.3 C), temperature source Oral, resp. rate 20, height 5' 2" (1.575 m), weight (!) 153.7 kg (338 lb 12.8 oz), SpO2 100 %.Body mass index is 61.97 kg/m.  General Appearance: Casual  Eye Contact:  Fair  Speech:  Slow  Volume:  Decreased  Mood:  Dysphoric  Affect:  Congruent  Thought Process:  Goal Directed  Orientation:  Full (Time, Place, and Person)  Thought Content:  Logical  Suicidal Thoughts:  No  Homicidal Thoughts:  No  Memory:  Immediate;   Good Recent;   Fair Remote;   Fair  Judgement:  Fair  Insight:  Fair  Psychomotor Activity:  Decreased  Concentration:  Concentration: Fair  Recall:  [employer] of Knowledge:  Fair  Language:  Fair  Akathisia:  No  Handed:  Right  AIMS (if indicated):     Assets:  Desire for Improvement Housing  ADL's:  Impaired  Cognition:  Impaired,  Mild  Sleep:        Treatment Plan Summary: Plan No change to plan for medication.  Psychiatric medicines reviewed.  Patient will probably need her injection within the next week or 2.   I will not put in any orders for now.  Supportive counseling with the patient.  Disposition: Patient does not meet criteria for psychiatric inpatient admission.  Alethia Berthold, MD 04/07/2017 7:36 PM

## 2017-04-07 NOTE — Clinical Social Work Note (Signed)
CSW spoke with Kit at [facility]. Patient has community medicaid and thus would be brought in under pending medicaid until she can transition to [facility] for SNF. Kit is coming today to evaluate but stated that her administrator will have final decision after she evals. Shela Leff MSW,LCSW [phone]

## 2017-04-07 NOTE — Progress Notes (Signed)
Patient ID: Sarah Hurst, female   DOB: [birth year], 56 y.o.   MRN: [id number]  [facility] PROGRESS NOTE  Sarah Hurst DOB: 02-Nov-1960 DOA: 03/24/2017 PCP: System, Pcp Not In  HPI/Subjective:  Patient feels well other than generalized weakness.  Want to be discharged  Objective: Vitals:   04/06/17 1402 04/07/17 0448  BP: 140/70 (!) 118/54  Pulse: (!) 59 (!) 58  Resp: 18   Temp: 97.9 F (36.6 C)   SpO2: 95%     Filed Weights   03/24/17 1210 03/31/17 1051 04/07/17 0431  Weight: (!) 143.8 kg (317 lb) (!) 152.4 kg (336 lb) (!) 153.7 kg (338 lb 12.8 oz)    ROS: Review of Systems  Unable to perform ROS: Mental acuity  Respiratory: Positive for shortness of breath.   Cardiovascular: Positive for chest pain.  Gastrointestinal: Positive for abdominal pain, nausea and vomiting. Negative for constipation and diarrhea.  Musculoskeletal: Negative for joint pain.   Exam: Physical Exam  Constitutional: She is oriented to person, place, and time.  HENT:  Nose: No mucosal edema.  Mouth/Throat: No oropharyngeal exudate or posterior oropharyngeal edema.  Eyes: Conjunctivae, EOM and lids are normal. Pupils are equal, round, and reactive to light.  Neck: No JVD present. Carotid bruit is not present. No edema present. No thyroid mass and no thyromegaly present.  Cardiovascular: S1 normal and S2 normal. Bradycardia present. Exam reveals no gallop.  Murmur heard.  Systolic murmur is present with a grade of 2/6. Pulses:      Dorsalis pedis pulses are 2+ on the right side, and 2+ on the left side.  Respiratory: No respiratory distress. She has no wheezes. She has no rhonchi. She has no rales.  GI: Soft. Bowel sounds are normal. There is no tenderness.  Musculoskeletal:       Right ankle: She exhibits swelling.       Left ankle: She exhibits swelling.  Lymphadenopathy:    She has no cervical adenopathy.  Neurological: She is alert and oriented to person, place, and  time. No cranial nerve deficit.  Skin: Skin is warm. No rash noted. Nails show no clubbing.  Psychiatric: She has a normal mood and affect.      Data Reviewed: Basic Metabolic Panel: Recent Labs  Lab 04/01/17 0532 04/02/17 0420 04/05/17 0801 04/05/17 1231 04/07/17 0420  NA 143 143 141  --  142  K 4.1 4.0 3.5  --  3.8  CL 112* 110 102  --  104  CO2 28 28 33*  --  32  GLUCOSE 98 103* 69  69 104* 74  BUN 39* 39* 20  --  22*  CREATININE 0.95 0.89 0.51  --  0.68  CALCIUM 9.2 9.1 8.7*  --  8.3*  MG 2.3  --   --   --   --   PHOS 3.0  --   --   --   --    CBC: Recent Labs  Lab 04/01/17 0532 04/02/17 0420 04/03/17 1156  WBC 7.5 7.6 12.1*  HGB 10.4* 10.7* 11.4*  HCT 32.6* 33.0* 35.8  MCV 90.1 89.6 90.0  PLT 77* 79* 89*    CBG: Recent Labs  Lab 04/05/17 0854 04/05/17 0943 04/05/17 1144 04/05/17 1734 04/05/17 2104  GLUCAP 59* 49* 54* 57* 62*    No results found for this or any previous visit (from the past 240 hour(s)).   Studies: No results found.  Scheduled Meds: . chlorhexidine gluconate (MEDLINE KIT)  15 mL  Mouth Rinse BID  . enoxaparin (LOVENOX) injection  40 mg Subcutaneous Q12H  . famotidine  20 mg Oral BID  . mouth rinse  15 mL Mouth Rinse QID  . multivitamin with minerals  1 tablet Oral Daily  . protein supplement shake  11 oz Oral BID BM  . sodium chloride flush  10 mL Intravenous Q12H   Continuous Infusions:   Assessment/Plan:   * Hypoglycemia. Was error on glucometer.  Blood glucose on lab studies was normal.  Stopped Accu-Cheks.  *Bradycardia with good blood pressure. Seen by cardiology.  Heart rate does come up with exercise.  No further workup planned by cardiology  * Clinical sepsis ruled out.  Patient was on empiric antibiotics and this was discontinued.  Hypothermia has improved.  * Nausea vomiting.  As needed Zofran ordered.  Increase Pepcid to twice daily. Resolved Lipase normal  * Acute encephalopathy this has improved from  being unresponsive on presentation.  * Schizoaffective disorder.  Seen by psychiatry  * Thrombocytopenia, mild.  Stable.  No bleeding.  SNF to evaluate patient today  Code Status:     Code Status Orders        Start     Ordered   03/27/17 1700  Full code  Continuous     03/27/17 1659    Code Status History    Date Active Date Inactive Code Status Order ID Comments User Context   01/24/2017  5:35 PM 01/28/2017  8:51 PM Full Code [id number]  Julianne Rice, MD ED   01/10/2017 10:02 PM 01/11/2017  6:32 PM Full Code [id number]  Daleen Bo, MD ED   12/26/2016  5:18 PM 12/26/2016 11:20 PM Full Code [id number]  Julianne Rice, MD ED   12/13/2016  3:16 PM 12/14/2016 11:02 PM Full Code [id number]  Noemi Chapel, MD ED    Advance Directive Documentation     Most Recent Value  Type of Advance Directive  Living will  Pre-existing out of facility DNR order (yellow form or pink MOST form)  -  "MOST" Form in Place?  -       Consultants:  Critical care team  Cardiology   Time spent: 35 minutes  Sarah Hurst, Sarah Hurst  [employer]

## 2017-04-07 NOTE — Clinical Social Work Note (Signed)
Kit with [facility] in [place] has stated that they can take patient tomorrow. CSW has notified [place] at [facility] and she is in agreement for her placement there. Shela Leff MSW,LCSW [phone]

## 2017-04-08 MED ORDER — ZOLPIDEM TARTRATE 5 MG PO TABS: 5.0000 mg | ORAL_TABLET | Freq: Every evening | ORAL | 0 refills | Status: DC | PRN

## 2017-04-08 NOTE — Discharge Summary (Signed)
[facility] - [place] [facility]   PATIENT NAME: Sarah Hurst    MR#:  [id number]  DATE OF BIRTH:  1960-12-15  DATE OF ADMISSION:  03/24/2017 ADMITTING PHYSICIAN: Ramonita LabAruna Gouru, MD  DATE OF DISCHARGE: No discharge date for patient encounter.  PRIMARY CARE PHYSICIAN: System, Pcp Not In   ADMISSION DIAGNOSIS:  Slurred speech [R47.81] Hypercarbia [R06.89] Altered mental status, unspecified altered mental status type [R41.82] Recurrent falls while walking [R29.6]  DISCHARGE DIAGNOSIS:  Principal Problem:   Schizoaffective disorder, bipolar type (HCC) Active Problems:   Hypoglycemia   Hypercarbia   Sinus bradycardia   SECONDARY DIAGNOSIS:   Past Medical History:  Diagnosis Date  . Asthma   . GERD (gastroesophageal reflux disease)   . Mental disability   . Schizoaffective disorder, bipolar type (HCC)   . Sleep apnea      ADMITTING HISTORY  HISTORY OF PRESENT ILLNESS:  Sarah Hurst  is a 56 y.o. female with a known history of schizoaffective disorder, GERD, sleep apnea) into the emergency department from a group home for frequent falls. Patient was evaluated by neurology and stroke ruled out, patient was seen by psychiatry Dr. Toni Amendclapacs for possible catatonia. According to the nurse's report patient was not eating or drinking from yesterday  Patient is profoundly hypoglycemic today, she has received several D50 ampules with no significant improvement and eventually patient is started on D5 half-normal saline, requiring frequent fingersticks patient is also hypothermic. Not communicating at all. No family members at bedside  HOSPITAL COURSE:   * Hypoglycemia. Was error on glucometer.  Blood glucose on lab studies was normal.  Stopped Accu-Cheks. Was treated with D50 infusion and later D10 drip.  This has been stopped for 3 days now.  Blood sugars stable.  *Bradycardia with good blood pressure. Seen by cardiology.  Heart rate does come up with exercise.   No further workup planned by cardiology  * Clinical sepsis ruled out.  Patient was on empiric antibiotics and this was discontinued.  Hypothermia has resolved  * Nausea vomiting.  Resolved Lipase normal  * Acute encephalopathy this has improved from being unresponsive on presentation.  * Schizoaffective disorder.  Seen by psychiatry. No medication changes.  * Thrombocytopenia, mild.  Stable.  No bleeding.  Patient worked with physical therapy.  Needs further skilled nursing facility for PT.  Stable for discharge.    CONSULTS OBTAINED:  Treatment Team:  K[facility], Neuro1, MD Clapacs, Jackquline DenmarkJohn T, MD Pauletta BrownsZeylikman, Yuriy, MD  DRUG ALLERGIES:  No Known Allergies  DISCHARGE MEDICATIONS:   Current Discharge Medication List    CONTINUE these medications which have NOT CHANGED   Details  acetaminophen (TYLENOL) 500 MG tablet Take 1,000 mg by mouth every 4 (four) hours as needed for moderate pain, fever or headache.    albuterol (PROVENTIL HFA;VENTOLIN HFA) 108 (90 Base) MCG/ACT inhaler Inhale 2 puffs into the lungs every 4 (four) hours as needed for wheezing or shortness of breath.    benztropine (COGENTIN) 1 MG tablet Take 1 mg by mouth 2 (two) times daily.    famotidine (PEPCID) 20 MG tablet Take 20 mg by mouth daily.    lactobacillus acidophilus (BACID) TABS tablet Take 1 tablet by mouth 3 (three) times daily.    loperamide (ANTI-DIARRHEAL) 2 MG capsule Take 2 mg by mouth daily as needed for diarrhea or loose stools.    Multiple Vitamins-Minerals (THERA-M PO) Take 1 tablet by mouth daily.    ondansetron (ZOFRAN) 4 MG tablet Take 4 mg  by mouth every 6 (six) hours as needed for nausea or vomiting.    paliperidone (INVEGA SUSTENNA) 156 MG/ML SUSP injection Inject 156 mg into the muscle every 30 (thirty) days.    risperiDONE (RISPERDAL) 2 MG tablet Take 2 mg by mouth 2 (two) times daily.       STOP taking these medications     insulin lispro (HUMALOG) 100 UNIT/ML  injection      zolpidem (AMBIEN) 5 MG tablet         Today   VITAL SIGNS:  Blood pressure 137/81, pulse (!) 52, temperature 97.7 F (36.5 C), temperature source Oral, resp. rate 17, height 5\' 2"  (1.575 m), weight (!) 153.7 kg (338 lb 12.8 oz), SpO2 100 %.  I/O:    Intake/Output Summary (Last 24 hours) at 04/08/2017 0941 Last data filed at 04/07/2017 1834 Gross per 24 hour  Intake 600 ml  Output 1700 ml  Net -1100 ml    PHYSICAL EXAMINATION:  Physical Exam  GENERAL:  56 y.o.-year-old patient lying in the bed with no acute distress.  Morbidly obese LUNGS: Normal breath sounds bilaterally, no wheezing, rales. CARDIOVASCULAR: S1, S2 normal. No murmurs, rubs, or gallops.  ABDOMEN: Soft, non-tender, non-distended. Bowel sounds present. No organomegaly or mass.  NEUROLOGIC: Moves all 4 extremities. PSYCHIATRIC: The patient is alert and awake  DATA REVIEW:   CBC Recent Labs  Lab 04/03/17 1156  WBC 12.1*  HGB 11.4*  HCT 35.8  PLT 89*    Chemistries  Recent Labs  Lab 04/05/17 0801  04/07/17 0420  NA 141  --  142  K 3.5  --  3.8  CL 102  --  104  CO2 33*  --  32  GLUCOSE 69  69   < > 74  BUN 20  --  22*  CREATININE 0.51  --  0.68  CALCIUM 8.7*  --  8.3*  AST 29  --   --   ALT 100*  --   --   ALKPHOS 70  --   --   BILITOT 0.6  --   --    < > = values in this interval not displayed.    Cardiac Enzymes No results for input(s): TROPONINI in the last 168 hours.  Microbiology Results  Results for orders placed or performed during the hospital encounter of 03/24/17  MRSA PCR Screening     Status: None   Collection Time: 03/27/17 12:17 PM  Result Value Ref Range Status   MRSA by PCR NEGATIVE NEGATIVE Final    Comment:        The GeneXpert MRSA Assay (FDA approved for NASAL specimens only), is one component of a comprehensive MRSA colonization surveillance program. It is not intended to diagnose MRSA infection nor to guide or monitor treatment for MRSA  infections.   CULTURE, BLOOD (ROUTINE X 2) w Reflex to ID Panel     Status: Abnormal   Collection Time: 03/27/17  1:51 PM  Result Value Ref Range Status   Specimen Description BLOOD RIGHT HAND  Final   Special Requests   Final    BOTTLES DRAWN AEROBIC AND ANAEROBIC Blood Culture adequate volume   Culture  Setup Time   Final    GRAM POSITIVE COCCI AEROBIC BOTTLE ONLY CRITICAL RESULT CALLED TO, READ BACK BY AND VERIFIED WITH: LISA CLUTTS AT 1605 03/28/17. MSS    Culture (A)  Final    STAPHYLOCOCCUS SPECIES (COAGULASE NEGATIVE) THE SIGNIFICANCE OF ISOLATING THIS ORGANISM FROM A  SINGLE SET OF BLOOD CULTURES WHEN MULTIPLE SETS ARE DRAWN IS UNCERTAIN. PLEASE NOTIFY THE MICROBIOLOGY DEPARTMENT WITHIN ONE WEEK IF SPECIATION AND SENSITIVITIES ARE REQUIRED. Performed at [facility], [address], [place], [place] [address]    Report Status 03/30/2017 FINAL  Final  Blood Culture ID Panel (Reflexed)     Status: Abnormal   Collection Time: 03/27/17  1:51 PM  Result Value Ref Range Status   Enterococcus species NOT DETECTED NOT DETECTED Final   Listeria monocytogenes NOT DETECTED NOT DETECTED Final   Staphylococcus species DETECTED (A) NOT DETECTED Final    Comment: Methicillin (oxacillin) susceptible coagulase negative staphylococcus. Possible blood culture contaminant (unless isolated from more than one blood culture draw or clinical case suggests pathogenicity). No antibiotic treatment is indicated for blood  culture contaminants. CRITICAL RESULT CALLED TO, READ BACK BY AND VERIFIED WITH: LISA CLUTTS AT 1605 03/28/17. MSS    Staphylococcus aureus NOT DETECTED NOT DETECTED Final   Methicillin resistance NOT DETECTED NOT DETECTED Final   Streptococcus species NOT DETECTED NOT DETECTED Final   Streptococcus agalactiae NOT DETECTED NOT DETECTED Final   Streptococcus pneumoniae NOT DETECTED NOT DETECTED Final   Streptococcus pyogenes NOT DETECTED NOT DETECTED Final   Acinetobacter  baumannii NOT DETECTED NOT DETECTED Final   Enterobacteriaceae species NOT DETECTED NOT DETECTED Final   Enterobacter cloacae complex NOT DETECTED NOT DETECTED Final   Escherichia coli NOT DETECTED NOT DETECTED Final   Klebsiella oxytoca NOT DETECTED NOT DETECTED Final   Klebsiella pneumoniae NOT DETECTED NOT DETECTED Final   Proteus species NOT DETECTED NOT DETECTED Final   Serratia marcescens NOT DETECTED NOT DETECTED Final   Haemophilus influenzae NOT DETECTED NOT DETECTED Final   Neisseria meningitidis NOT DETECTED NOT DETECTED Final   Pseudomonas aeruginosa NOT DETECTED NOT DETECTED Final   Candida albicans NOT DETECTED NOT DETECTED Final   Candida glabrata NOT DETECTED NOT DETECTED Final   Candida krusei NOT DETECTED NOT DETECTED Final   Candida parapsilosis NOT DETECTED NOT DETECTED Final   Candida tropicalis NOT DETECTED NOT DETECTED Final  Urine Culture     Status: None   Collection Time: 03/27/17  2:41 PM  Result Value Ref Range Status   Specimen Description URINE, RANDOM  Final   Special Requests NONE  Final   Culture   Final    NO GROWTH Performed at [facility], [address] [place], [place] [address]    Report Status 03/28/2017 FINAL  Final  CULTURE, BLOOD (ROUTINE X 2) w Reflex to ID Panel     Status: None   Collection Time: 03/27/17  3:10 PM  Result Value Ref Range Status   Specimen Description BLOOD BLOOD RIGHT HAND  Final   Special Requests   Final    BOTTLES DRAWN AEROBIC AND ANAEROBIC Blood Culture adequate volume   Culture NO GROWTH 5 DAYS  Final   Report Status 04/01/2017 FINAL  Final    RADIOLOGY:  No results found.  Follow up with PCP in 1 week.  Management plans discussed with the patient, family and they are in agreement.  CODE STATUS:     Code Status Orders  (From admission, onward)        Start     Ordered   03/27/17 1700  Full code  Continuous     03/27/17 1659    Code Status History    Date Active Date Inactive Code  Status Order ID Comments User Context   01/24/2017 17:35  01/28/2017 20:51 Full Code [id number]  Loren Racer, MD ED   01/10/2017 22:02 01/11/2017 18:32 Full Code [id number]  Mancel Bale, MD ED   12/26/2016 17:18 12/26/2016 23:20 Full Code [id number]  Loren Racer, MD ED   12/13/2016 15:16 12/14/2016 23:02 Full Code [id number]  Eber Hong, MD ED    Advance Directive Documentation     Most Recent Value  Type of Advance Directive  Living will  Pre-existing out of facility DNR order (yellow form or pink MOST form)  No data  "MOST" Form in Place?  No data      TOTAL TIME TAKING CARE OF THIS PATIENT ON DAY OF DISCHARGE: more than 30 minutes.   Milagros Loll R M.D on 04/08/2017 at 9:41 AM  Between 7am to 6pm - Pager - [phone]  After 6pm go to [email or web address] - password [email or web address]  [facility]  Office  [phone]  CC: Primary care physician; System, Pcp Not In  Note: This dictation was prepared with Dragon dictation along with smaller phrase technology. Any transcriptional errors that result from this process are unintentional.

## 2017-04-08 NOTE — Plan of Care (Signed)
Pt is d[facility].  Pt's temp has been WNL. No c/o pain.  Removed Triple lumen IJ.  Pt is alert and oriented to self.  Incontinent of B&B this am.  Given a bath.  She has good appetite and is aware that [facility].  Called report - she is going to Rm 221A.  EMS called for transport.

## 2017-04-08 NOTE — Clinical Social Work Placement (Signed)
   CLINICAL SOCIAL WORK PLACEMENT  NOTE  Date:  04/08/2017  Patient Details  Name: Sarah Hurst MRN: [id number] Date of Birth: 07/16/1960  Clinical Social Work is seeking post-discharge placement for this patient at the Skilled  Nursing Facility level of care (*CSW will initial, date and re-position this form in  chart as items are completed):  Yes   Patient/family provided with [facility] Clinical Social Work Department's list of facilities offering this level of care within the geographic area requested by the patient (or if unable, by the patient's family).  Yes   Patient/family informed of their freedom to choose among providers that offer the needed level of care, that participate in Medicare, Medicaid or managed care program needed by the patient, have an available bed and are willing to accept the patient.  Yes   Patient/family informed of [facility]'s ownership interest in [facility] and [facility], as well as of the fact that they are under no obligation to receive care at these facilities.  PASRR submitted to EDS on 03/26/17     PASRR number received on 03/27/17     Existing PASRR number confirmed on       FL2 transmitted to all facilities in geographic area requested by pt/family on 03/26/17     FL2 transmitted to all facilities within larger geographic area on       Patient informed that his/her managed care company has contracts with or will negotiate with certain facilities, including the following:        Yes   Patient/family informed of bed offers received.  Patient [facility] bed [facility] [place]     Physician recommends and patient chooses bed at      Patient to be transferred to [facility] [place] ) on 04/08/17.  Patient to be transferred to facility by [facility] )     Patient family notified on 04/08/17 of transfer.  Name of family member notified:  (Patient's guardian Sarah Hurst is aware of D/C today. )      PHYSICIAN       Additional Comment:    _______________________________________________ Sarah Hurst, Sarah CrockerBailey M, LCSW 04/08/2017, 12:19 PM

## 2017-04-08 NOTE — Progress Notes (Signed)
Patient is medically stable for D/C to [facility] today. Per [facility] admissions coordinator patient can come today to room 221-A. RN will call report to 200 hall nurse at [phone] and arrange EMS for transport. [facility] crew chief is aware of transport. Clinical Education officer, museum (CSW) sent D/C orders to [facility] in [place] today via [place]. Patient's guardian Tedra Coupe is aware of above. CSW sent D/C summary to [facility] as she requested. Please reconsult if future social work needs arise. CSW signing off.   [employer], LCSW [phone]

## 2017-04-08 NOTE — Discharge Instructions (Signed)
Regular diet ° °Activity as tolerated with assistance °

## 2017-05-29 ENCOUNTER — Telehealth: Payer: Self-pay

## 2017-05-29 NOTE — Telephone Encounter (Signed)
Pt is on a list of [facility] ED visitors. Pt was called today to the number provided, [phone]. Number turned out to be the number for [facility]. Person who answered the phone stated to be a Ms. Ross who works at [employer]. Ms. Tenny CrawRoss stated the pt no longer lives there. Pt is unavailable.     Malania Gawthrop R. CorderRangel, [place] [phone]

## 2018-12-29 IMAGING — CR DG CHEST 2V
2 series · 2 of 2 positions shown · non-contrast
Comparison: November 25, 2016

CLINICAL DATA: Several recent falls

EXAM:
CHEST  2 VIEW

[chest lat]
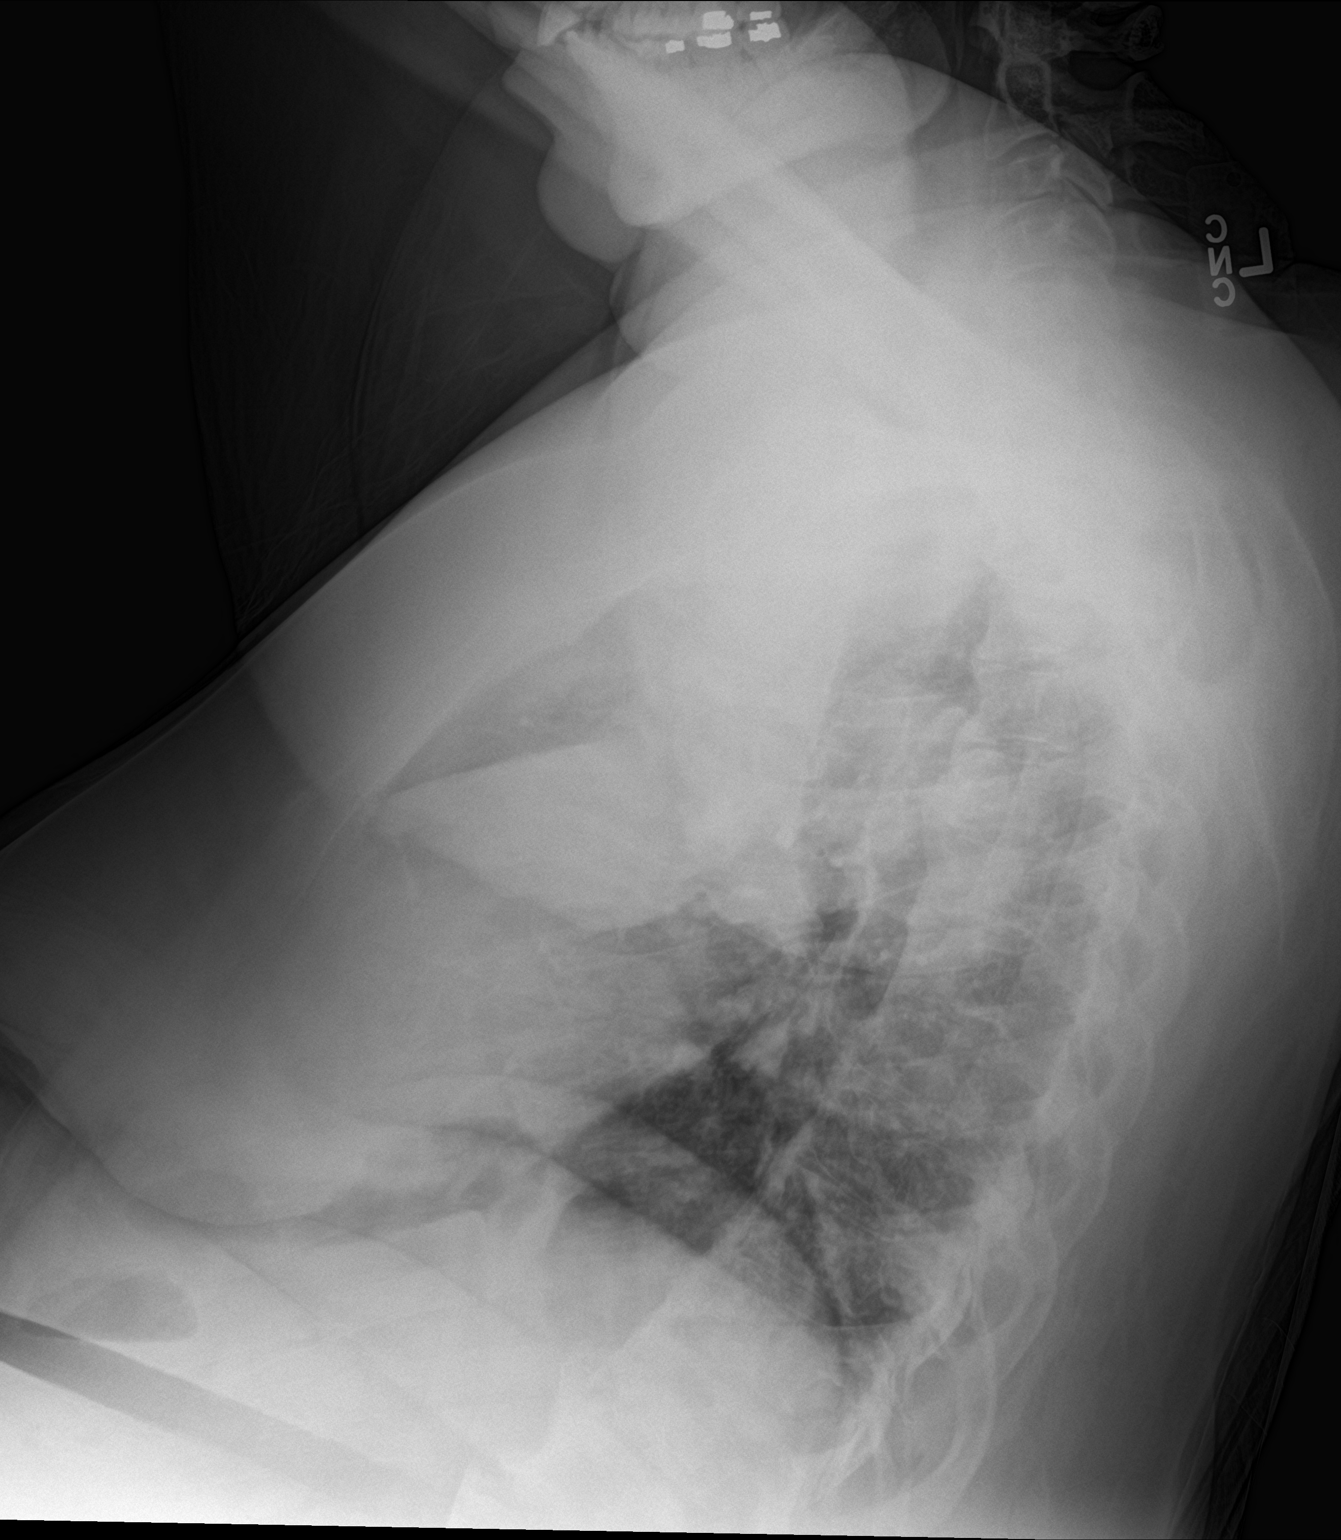

[chest ap]
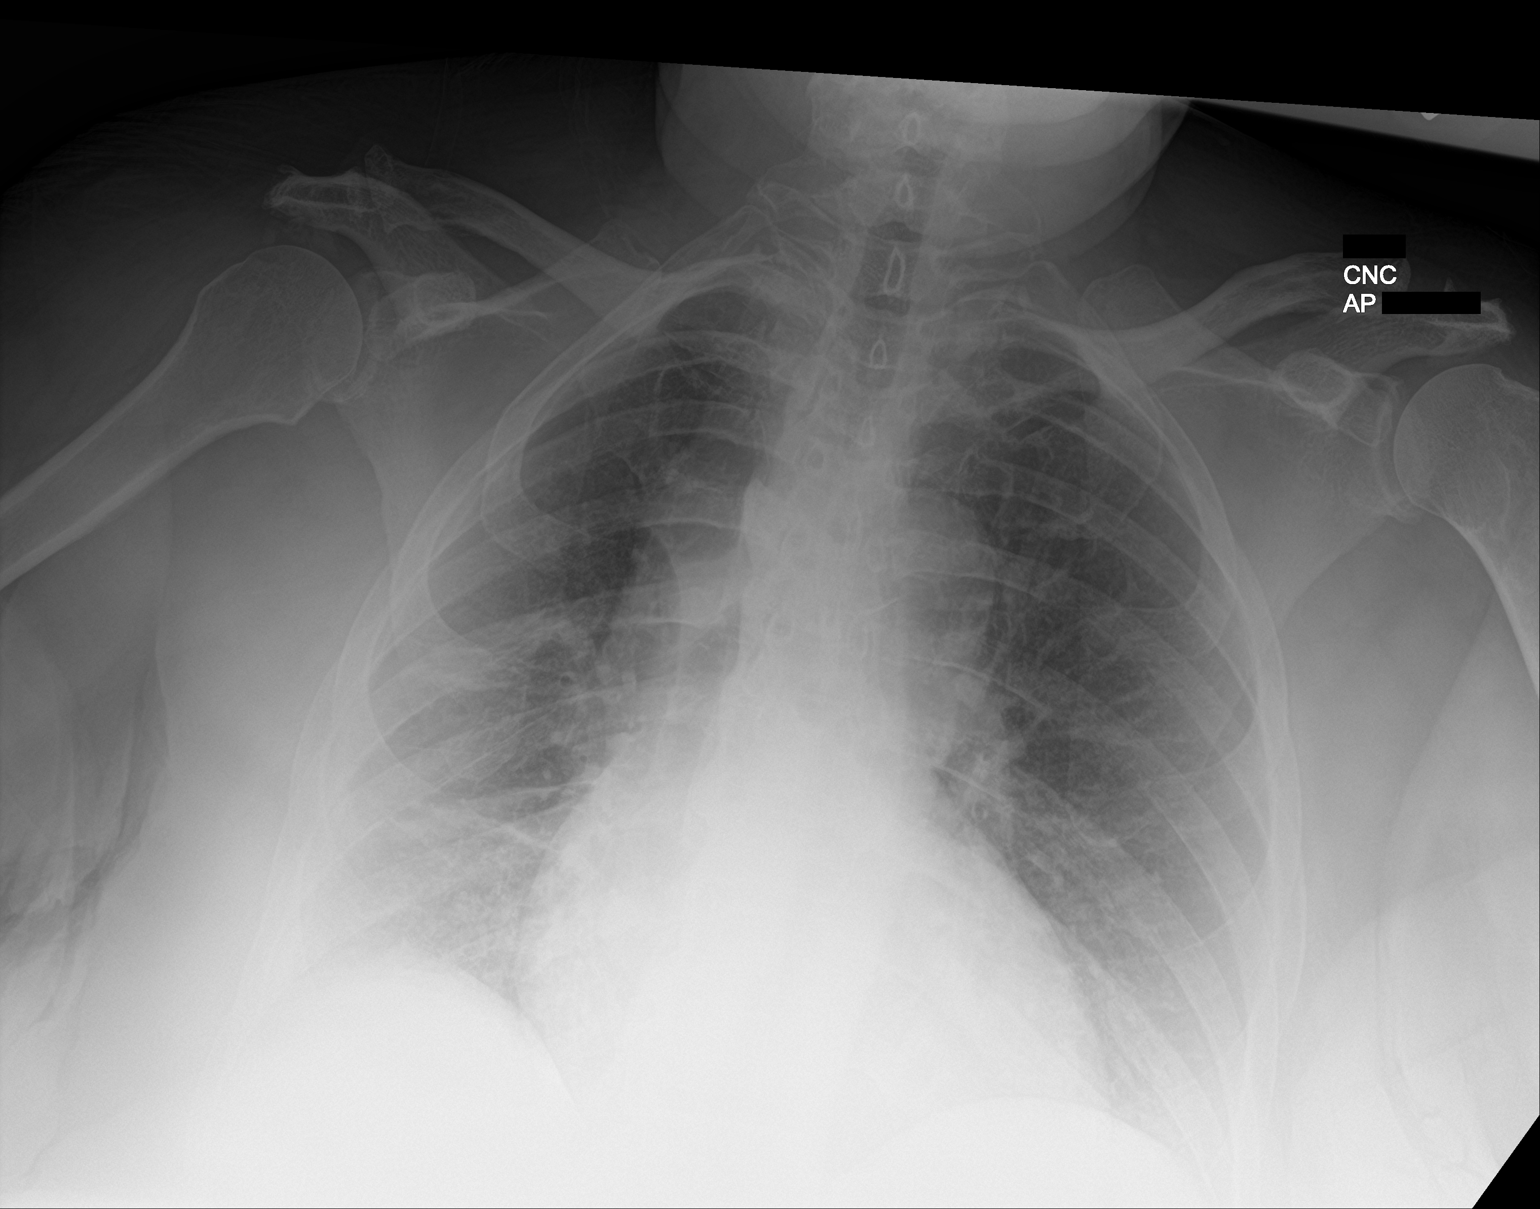

[2 of 2 positions shown; findings below may reference images not displayed]

FINDINGS: There is atelectatic change in the right base. Lungs elsewhere are
clear. Heart is upper normal in size with pulmonary vascularity
within normal limits. No pneumothorax. No bone lesions.
IMPRESSION: Right lower lobe atelectatic change. No edema or consolidation.
Stable cardiac silhouette. No pneumothorax.

## 2018-12-29 IMAGING — CT CT HEAD W/O CM
3 of 6 series · 14 of 47 positions shown, 17 images · non-contrast
Comparison: 11/25/2016

CLINICAL DATA: 56-year-old female with frequent falls over the past
2 weeks. Reason episode of altered mental status. Now baseline.
Initial encounter.

EXAM:
CT HEAD WITHOUT CONTRAST
TECHNIQUE: Contiguous axial images were obtained from the base of the skull
through the vertex without intravenous contrast.

[Series 2: head wo · axial · 0.47mm/px · z∈[-129,+6]mm · 9 of 33 slices shown, 12 images]
[im 3/33  brain]
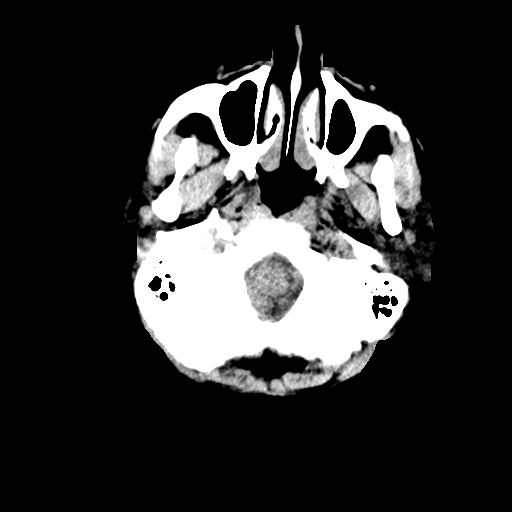
[im 3/33  bone]
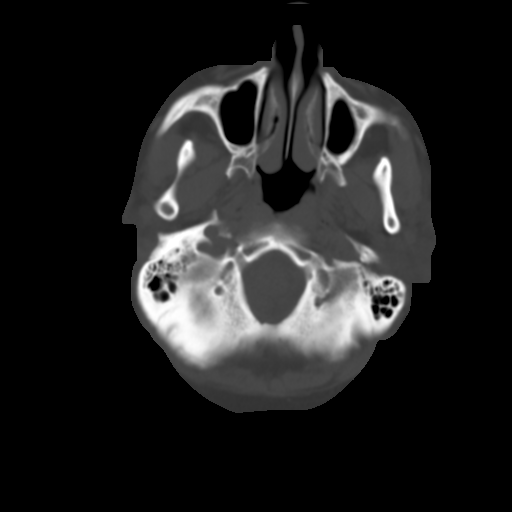
[im 7/33  brain]
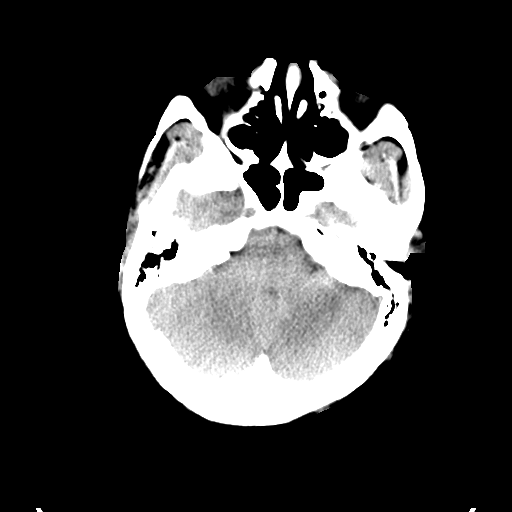
[im 10/33  brain]
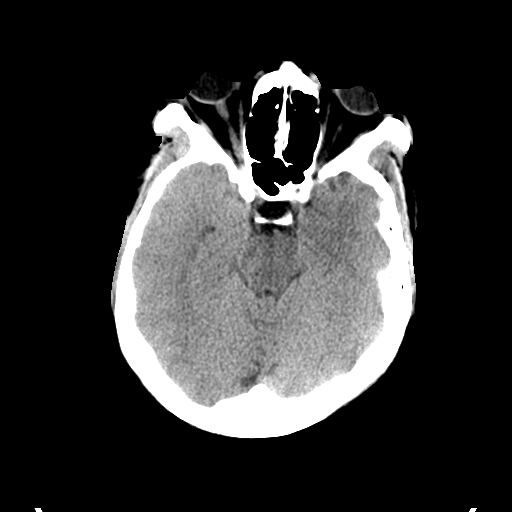
[im 14/33  brain]
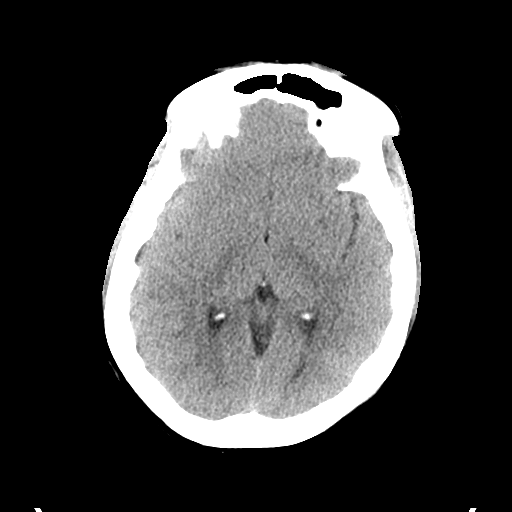
[im 17/33  brain]
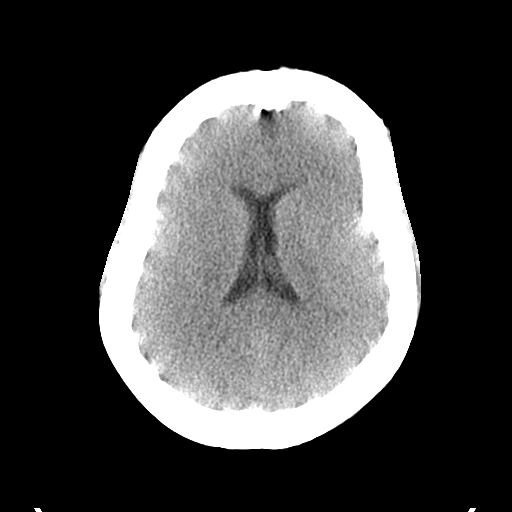
[im 17/33  bone]
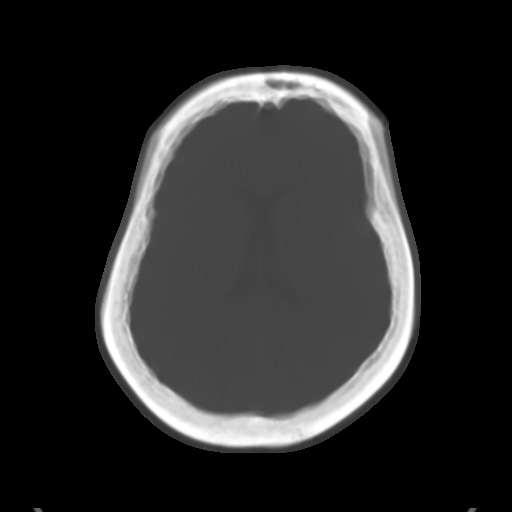
[im 19/33  brain]
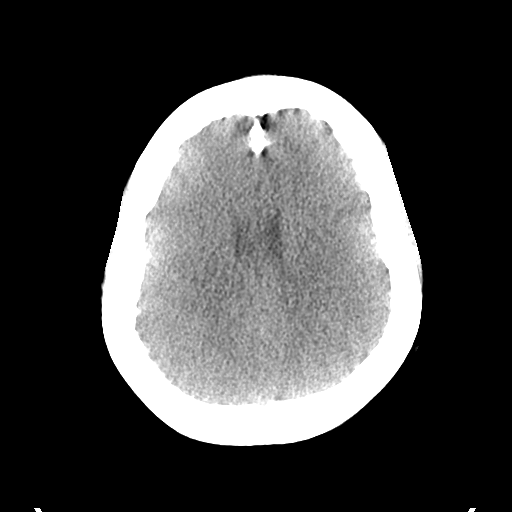
[im 23/33  brain]
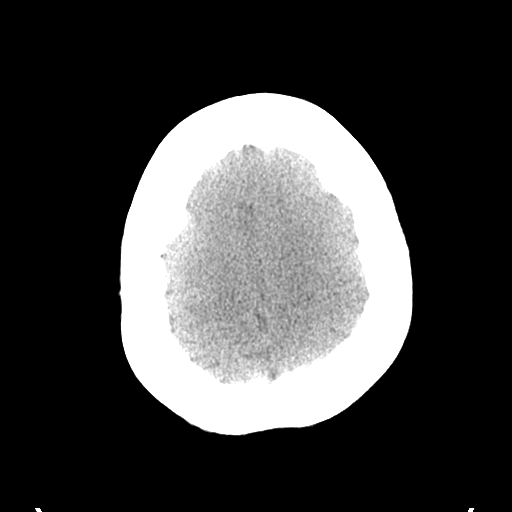
[im 26/33  brain]
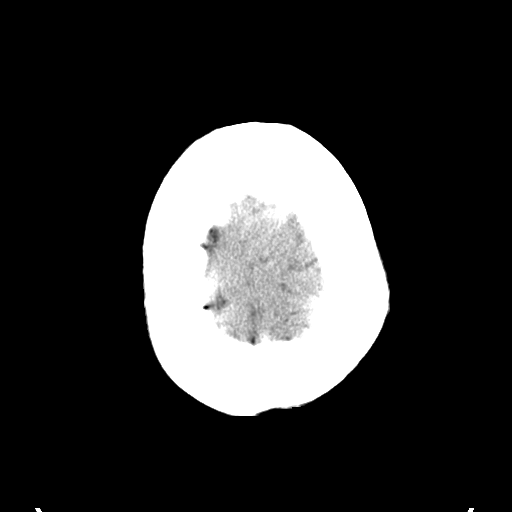
[im 30/33  brain]
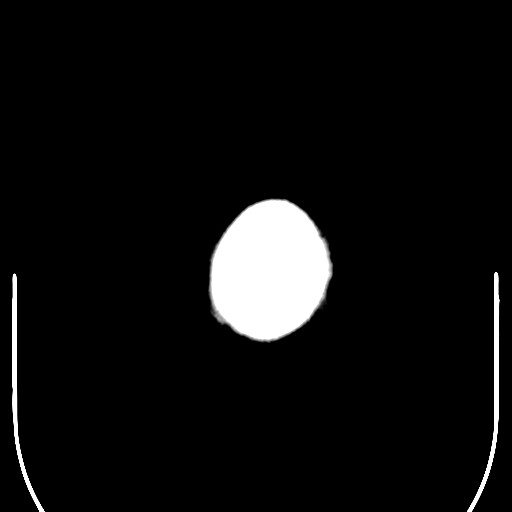
[im 30/33  bone]
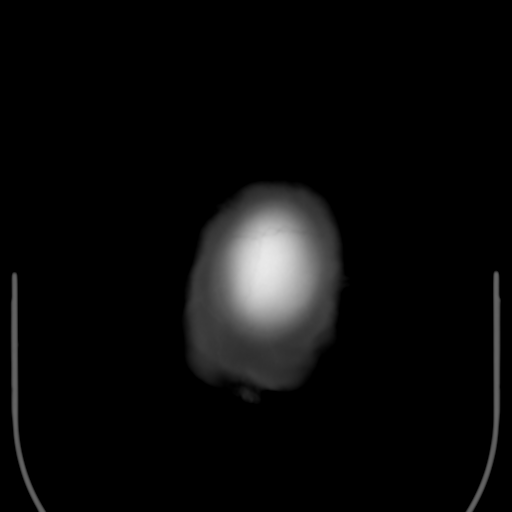

[Series 4: coronal soft tissue · coronal · 0.31mm/px · 3 of 60 slices shown]
[im 15/60  brain]
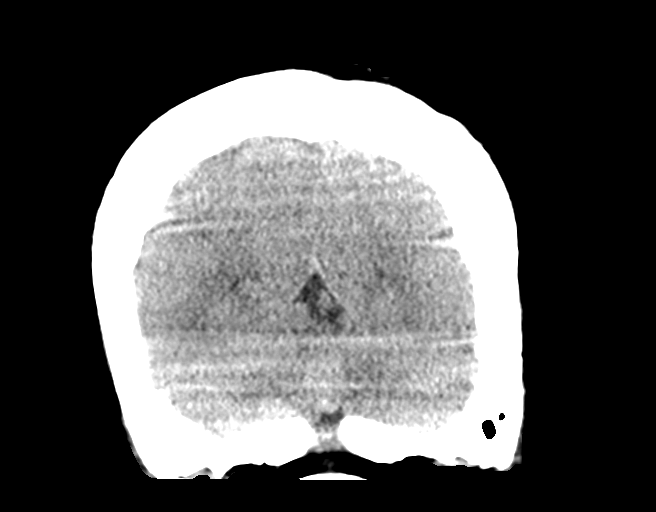
[im 30/60  brain]
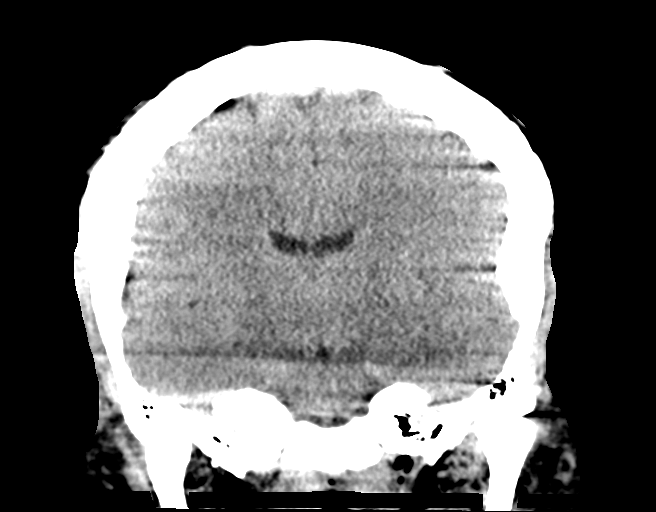
[im 45/60  brain]
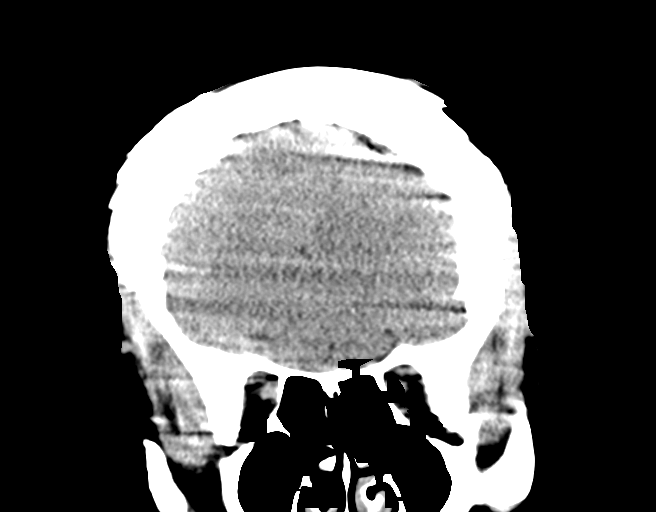

[Series 9: sagittal soft tissue · sagittal · 0.29mm/px · 2 of 52 slices shown]
[im 18/52  brain]
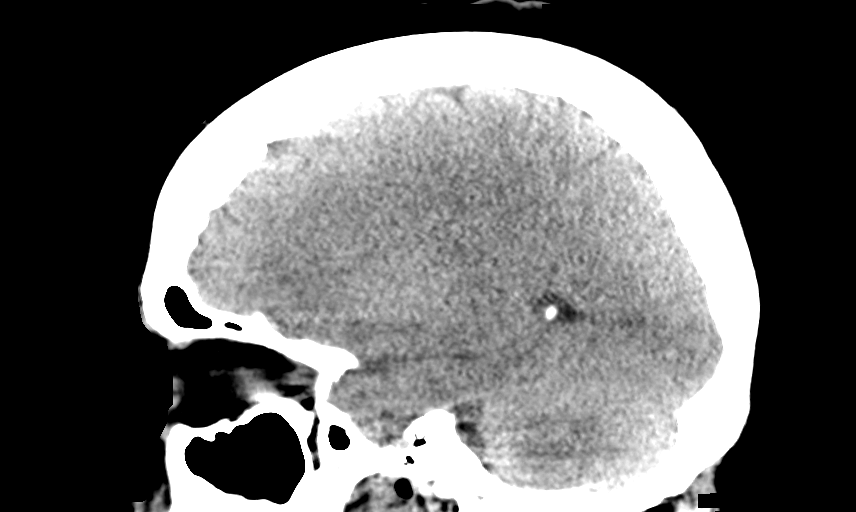
[im 35/52  brain]
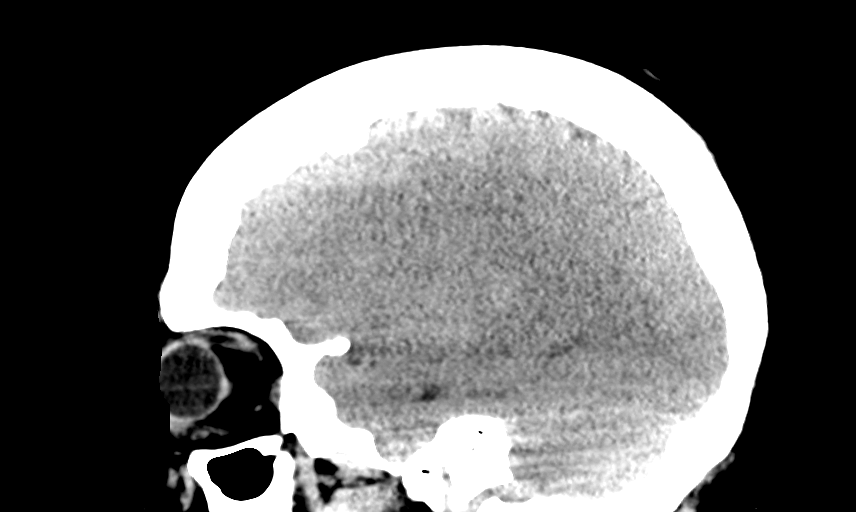

[14 of 47 positions shown; findings below may reference images not displayed]

FINDINGS: Brain: Exam motion degraded. Images repeated. No obvious
intracranial hemorrhage or CT evidence of large acute infarct.

No intracranial mass lesion noted on this unenhanced exam.

Vascular: No hyperdense vessel.

Skull: No skull fracture. Hyperostosis frontalis interna
incidentally noted.

Sinuses/Orbits: Exophthalmos without acute orbital abnormality.
Visualized paranasal sinuses are clear.

Other: Minimal partial opacification right mastoid air cells.
IMPRESSION: Motion degraded examination without obvious intracranial hemorrhage
or acute infarct.

No skull fracture.

Minimal partial opacification right mastoid air cells.
